# Patient Record
Sex: Female | Born: 1960
Health system: Southern US, Community
[De-identification: ages and names within clinical notes are randomized; demographics above are authoritative.]

## PROBLEM LIST (undated history)

## (undated) DIAGNOSIS — M549 Dorsalgia, unspecified: Secondary | ICD-10-CM

## (undated) DIAGNOSIS — R1310 Dysphagia, unspecified: Secondary | ICD-10-CM

## (undated) DIAGNOSIS — F4010 Social phobia, unspecified: Secondary | ICD-10-CM

## (undated) DIAGNOSIS — R12 Heartburn: Secondary | ICD-10-CM

## (undated) DIAGNOSIS — D219 Benign neoplasm of connective and other soft tissue, unspecified: Secondary | ICD-10-CM

## (undated) DIAGNOSIS — A0472 Enterocolitis due to Clostridium difficile, not specified as recurrent: Secondary | ICD-10-CM

## (undated) DIAGNOSIS — F320 Major depressive disorder, single episode, mild: Secondary | ICD-10-CM

## (undated) DIAGNOSIS — Z87898 Personal history of other specified conditions: Secondary | ICD-10-CM

## (undated) DIAGNOSIS — F419 Anxiety disorder, unspecified: Secondary | ICD-10-CM

## (undated) DIAGNOSIS — F32A Depression, unspecified: Secondary | ICD-10-CM

## (undated) DIAGNOSIS — M47816 Spondylosis without myelopathy or radiculopathy, lumbar region: Secondary | ICD-10-CM

## (undated) DIAGNOSIS — N870 Mild cervical dysplasia: Secondary | ICD-10-CM

## (undated) DIAGNOSIS — E7800 Pure hypercholesterolemia, unspecified: Secondary | ICD-10-CM

## (undated) DIAGNOSIS — E559 Vitamin D deficiency, unspecified: Secondary | ICD-10-CM

## (undated) DIAGNOSIS — M722 Plantar fascial fibromatosis: Secondary | ICD-10-CM

## (undated) DIAGNOSIS — K219 Gastro-esophageal reflux disease without esophagitis: Secondary | ICD-10-CM

## (undated) DIAGNOSIS — G4730 Sleep apnea, unspecified: Secondary | ICD-10-CM

## (undated) DIAGNOSIS — R0602 Shortness of breath: Secondary | ICD-10-CM

## (undated) DIAGNOSIS — M9420 Chondromalacia, unspecified site: Secondary | ICD-10-CM

## (undated) DIAGNOSIS — M7989 Other specified soft tissue disorders: Secondary | ICD-10-CM

## (undated) DIAGNOSIS — B009 Herpesviral infection, unspecified: Secondary | ICD-10-CM

## (undated) DIAGNOSIS — R002 Palpitations: Secondary | ICD-10-CM

## (undated) DIAGNOSIS — Z973 Presence of spectacles and contact lenses: Secondary | ICD-10-CM

## (undated) DIAGNOSIS — N8110 Cystocele, unspecified: Secondary | ICD-10-CM

## (undated) HISTORY — DX: Dysphagia, unspecified: R13.10

## (undated) HISTORY — DX: Cystocele, unspecified: N81.10

## (undated) HISTORY — DX: Shortness of breath: R06.02

## (undated) HISTORY — DX: Dorsalgia, unspecified: M54.9

## (undated) HISTORY — DX: Herpesviral infection, unspecified: B00.9

## (undated) HISTORY — DX: Depression, unspecified: F32.A

## (undated) HISTORY — DX: Heartburn: R12

## (undated) HISTORY — PX: TUBAL LIGATION: SHX77

## (undated) HISTORY — DX: Sleep apnea, unspecified: G47.30

## (undated) HISTORY — PX: APPENDECTOMY: SHX54

## (undated) HISTORY — DX: Vitamin D deficiency, unspecified: E55.9

## (undated) HISTORY — DX: Social phobia, unspecified: F40.10

## (undated) HISTORY — DX: Plantar fascial fibromatosis: M72.2

## (undated) HISTORY — DX: Anxiety disorder, unspecified: F41.9

## (undated) HISTORY — DX: Palpitations: R00.2

## (undated) HISTORY — DX: Mild cervical dysplasia: N87.0

## (undated) HISTORY — DX: Benign neoplasm of connective and other soft tissue, unspecified: D21.9

## (undated) HISTORY — DX: Enterocolitis due to Clostridium difficile, not specified as recurrent: A04.72

## (undated) HISTORY — DX: Gastro-esophageal reflux disease without esophagitis: K21.9

## (undated) HISTORY — DX: Major depressive disorder, single episode, mild: F32.0

## (undated) HISTORY — DX: Chondromalacia, unspecified site: M94.20

## (undated) HISTORY — DX: Spondylosis without myelopathy or radiculopathy, lumbar region: M47.816

## (undated) HISTORY — PX: DG BARIUM SWALLOW (ARMC HX): HXRAD1448

## (undated) HISTORY — PX: COLONOSCOPY: SHX174

## (undated) HISTORY — DX: Pure hypercholesterolemia, unspecified: E78.00

## (undated) HISTORY — DX: Other specified soft tissue disorders: M79.89

## (undated) HISTORY — PX: AUGMENTATION MAMMAPLASTY: SUR837

---
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
## 1989-10-08 DIAGNOSIS — N870 Mild cervical dysplasia: Secondary | ICD-10-CM

## 1989-10-08 HISTORY — DX: Mild cervical dysplasia: N87.0

## 1995-01-07 HISTORY — PX: TUBAL LIGATION: SHX77

## 1996-10-08 DIAGNOSIS — F4010 Social phobia, unspecified: Secondary | ICD-10-CM

## 1996-10-08 HISTORY — DX: Social phobia, unspecified: F40.10

## 1999-10-30 ENCOUNTER — Other Ambulatory Visit: Admission: RE | Admit: 1999-10-30 | Discharge: 1999-10-30 | Payer: Self-pay | Admitting: Obstetrics and Gynecology

## 2000-08-23 ENCOUNTER — Encounter: Admission: RE | Admit: 2000-08-23 | Discharge: 2000-08-23 | Payer: Self-pay | Admitting: Family Medicine

## 2000-08-23 ENCOUNTER — Encounter: Payer: Self-pay | Admitting: Family Medicine

## 2000-12-25 ENCOUNTER — Other Ambulatory Visit: Admission: RE | Admit: 2000-12-25 | Discharge: 2000-12-25 | Payer: Self-pay | Admitting: Obstetrics and Gynecology

## 2002-02-17 ENCOUNTER — Other Ambulatory Visit: Admission: RE | Admit: 2002-02-17 | Discharge: 2002-02-17 | Payer: Self-pay | Admitting: Obstetrics and Gynecology

## 2003-02-24 ENCOUNTER — Other Ambulatory Visit: Admission: RE | Admit: 2003-02-24 | Discharge: 2003-02-24 | Payer: Self-pay | Admitting: Obstetrics and Gynecology

## 2003-03-05 ENCOUNTER — Encounter: Payer: Self-pay | Admitting: Obstetrics and Gynecology

## 2003-03-05 ENCOUNTER — Encounter: Admission: RE | Admit: 2003-03-05 | Discharge: 2003-03-05 | Payer: Self-pay | Admitting: Obstetrics and Gynecology

## 2004-03-13 ENCOUNTER — Encounter: Admission: RE | Admit: 2004-03-13 | Discharge: 2004-03-13 | Payer: Self-pay | Admitting: Obstetrics and Gynecology

## 2004-06-06 ENCOUNTER — Other Ambulatory Visit: Admission: RE | Admit: 2004-06-06 | Discharge: 2004-06-06 | Payer: Self-pay | Admitting: Obstetrics and Gynecology

## 2004-12-04 ENCOUNTER — Emergency Department (HOSPITAL_COMMUNITY): Admission: EM | Admit: 2004-12-04 | Discharge: 2004-12-05 | Payer: Self-pay | Admitting: Emergency Medicine

## 2005-05-07 ENCOUNTER — Encounter: Admission: RE | Admit: 2005-05-07 | Discharge: 2005-05-07 | Payer: Self-pay | Admitting: Obstetrics and Gynecology

## 2005-10-10 ENCOUNTER — Other Ambulatory Visit: Admission: RE | Admit: 2005-10-10 | Discharge: 2005-10-10 | Payer: Self-pay | Admitting: Obstetrics and Gynecology

## 2006-05-14 ENCOUNTER — Ambulatory Visit (HOSPITAL_COMMUNITY): Admission: RE | Admit: 2006-05-14 | Discharge: 2006-05-14 | Payer: Self-pay | Admitting: Obstetrics & Gynecology

## 2006-06-26 ENCOUNTER — Encounter: Admission: RE | Admit: 2006-06-26 | Discharge: 2006-06-26 | Payer: Self-pay | Admitting: Obstetrics and Gynecology

## 2006-10-08 HISTORY — PX: BREAST ENHANCEMENT SURGERY: SHX7

## 2007-02-10 ENCOUNTER — Other Ambulatory Visit: Admission: RE | Admit: 2007-02-10 | Discharge: 2007-02-10 | Payer: Self-pay | Admitting: Gynecology

## 2008-02-13 ENCOUNTER — Other Ambulatory Visit: Admission: RE | Admit: 2008-02-13 | Discharge: 2008-02-13 | Payer: Self-pay | Admitting: Obstetrics and Gynecology

## 2008-10-08 DIAGNOSIS — B009 Herpesviral infection, unspecified: Secondary | ICD-10-CM

## 2008-10-08 HISTORY — DX: Herpesviral infection, unspecified: B00.9

## 2008-12-10 ENCOUNTER — Other Ambulatory Visit: Admission: RE | Admit: 2008-12-10 | Discharge: 2008-12-10 | Payer: Self-pay | Admitting: Obstetrics and Gynecology

## 2009-02-18 ENCOUNTER — Other Ambulatory Visit: Admission: RE | Admit: 2009-02-18 | Discharge: 2009-02-18 | Payer: Self-pay | Admitting: Obstetrics and Gynecology

## 2009-05-03 ENCOUNTER — Encounter: Admission: RE | Admit: 2009-05-03 | Discharge: 2009-05-03 | Payer: Self-pay | Admitting: Family Medicine

## 2010-05-08 ENCOUNTER — Encounter: Admission: RE | Admit: 2010-05-08 | Discharge: 2010-05-08 | Payer: Self-pay | Admitting: Obstetrics and Gynecology

## 2011-05-11 ENCOUNTER — Other Ambulatory Visit: Payer: Self-pay | Admitting: Obstetrics and Gynecology

## 2011-05-11 DIAGNOSIS — Z1231 Encounter for screening mammogram for malignant neoplasm of breast: Secondary | ICD-10-CM

## 2011-05-29 ENCOUNTER — Ambulatory Visit
Admission: RE | Admit: 2011-05-29 | Discharge: 2011-05-29 | Disposition: A | Discharge status: Home / Self Care | Payer: BC Managed Care – PPO | Source: Ambulatory Visit | Attending: Obstetrics and Gynecology | Admitting: Obstetrics and Gynecology

## 2011-05-29 DIAGNOSIS — Z1231 Encounter for screening mammogram for malignant neoplasm of breast: Secondary | ICD-10-CM

## 2011-07-23 ENCOUNTER — Other Ambulatory Visit: Payer: Self-pay | Admitting: Family Medicine

## 2011-07-23 ENCOUNTER — Ambulatory Visit
Admission: RE | Admit: 2011-07-23 | Discharge: 2011-07-23 | Disposition: A | Discharge status: Home / Self Care | Payer: BC Managed Care – PPO | Source: Ambulatory Visit | Attending: Family Medicine | Admitting: Family Medicine

## 2012-08-28 ENCOUNTER — Other Ambulatory Visit: Payer: Self-pay | Admitting: Family Medicine

## 2012-08-28 DIAGNOSIS — Z1231 Encounter for screening mammogram for malignant neoplasm of breast: Secondary | ICD-10-CM

## 2012-10-13 ENCOUNTER — Other Ambulatory Visit: Payer: Self-pay | Admitting: Family Medicine

## 2012-10-13 DIAGNOSIS — R1030 Lower abdominal pain, unspecified: Secondary | ICD-10-CM

## 2012-10-13 DIAGNOSIS — R102 Pelvic and perineal pain: Secondary | ICD-10-CM

## 2012-10-14 ENCOUNTER — Ambulatory Visit
Admission: RE | Admit: 2012-10-14 | Discharge: 2012-10-14 | Disposition: A | Discharge status: Home / Self Care | Payer: BC Managed Care – PPO | Source: Ambulatory Visit | Attending: Family Medicine | Admitting: Family Medicine

## 2012-10-14 ENCOUNTER — Other Ambulatory Visit: Payer: Self-pay | Admitting: Family Medicine

## 2012-10-14 DIAGNOSIS — R102 Pelvic and perineal pain: Secondary | ICD-10-CM

## 2012-10-14 DIAGNOSIS — D259 Leiomyoma of uterus, unspecified: Secondary | ICD-10-CM

## 2012-10-14 DIAGNOSIS — R1030 Lower abdominal pain, unspecified: Secondary | ICD-10-CM

## 2012-10-18 ENCOUNTER — Ambulatory Visit
Admission: RE | Admit: 2012-10-18 | Discharge: 2012-10-18 | Disposition: A | Discharge status: Home / Self Care | Payer: BC Managed Care – PPO | Source: Ambulatory Visit | Attending: Family Medicine | Admitting: Family Medicine

## 2012-10-18 DIAGNOSIS — D259 Leiomyoma of uterus, unspecified: Secondary | ICD-10-CM

## 2012-10-18 MED ORDER — GADOBENATE DIMEGLUMINE 529 MG/ML IV SOLN
14.0000 mL | Freq: Once | INTRAVENOUS | Status: AC | PRN
  Administered 2012-10-18: 14 mL via INTRAVENOUS

## 2013-03-03 ENCOUNTER — Encounter: Payer: Self-pay | Admitting: Obstetrics and Gynecology

## 2013-03-03 DIAGNOSIS — F4010 Social phobia, unspecified: Secondary | ICD-10-CM | POA: Insufficient documentation

## 2013-03-04 ENCOUNTER — Ambulatory Visit (INDEPENDENT_AMBULATORY_CARE_PROVIDER_SITE_OTHER): Payer: BC Managed Care – PPO | Admitting: Obstetrics and Gynecology

## 2013-03-04 ENCOUNTER — Encounter: Payer: Self-pay | Admitting: Obstetrics and Gynecology

## 2013-03-04 VITALS — BP 100/60 | HR 72 | Wt 157.0 lb

## 2013-03-04 DIAGNOSIS — D251 Intramural leiomyoma of uterus: Secondary | ICD-10-CM

## 2013-03-04 NOTE — Patient Instructions (Signed)
We will call you to schedule your ultrasound.  

## 2013-03-04 NOTE — Progress Notes (Signed)
52 yo widowed WF G2P2 here to re-evaluate uterine fibroids.  Pt reports continued pelivc pain, mostly on her right side, sometimes also in her back.  She feels it more when she bends and lifts;  The muscles on that right side feel very fatigued.  The pain comes on monthly, and lasts about a week before her period, and then resolves when her period starts.  Pt's sister had endometriosis, and pt wondering if she has it too.    MR pelvis ordered by Dr. Duaine Dredge in January reviewed.  Showed two fibroids, each about 2 1/2 cm.  No mention of any abnormality in the right hip, which is where pt is feeling her pain.    Exam:  WF, NAD     Abd:  Soft, NT, Nl BS, no masses.     No Adenopathy in groin     Ext, vag, cx nl.  Uterus nodular, especially posteriorly.  It feels nl size in terms of how tall it is, and I can feel a small fibroid in the posterior uterine segment, but there is a suggestion of a larger fundal poss fibroid in the post fundus where I cannot feel very well.  No adnexal masses or tenderness.  A:  Right LQ pelvic/hip/flank pain.  R/O enlarging fibroids vs other cause  P:  Repeat pus.  Then approp tx.  Pt to take aleve prn for now.

## 2013-03-26 ENCOUNTER — Telehealth: Payer: Self-pay | Admitting: Obstetrics and Gynecology

## 2013-04-08 ENCOUNTER — Other Ambulatory Visit: Payer: BC Managed Care – PPO | Admitting: Obstetrics and Gynecology

## 2013-04-08 ENCOUNTER — Other Ambulatory Visit: Payer: BC Managed Care – PPO

## 2013-04-21 NOTE — Telephone Encounter (Signed)
LMTCB on mobile number to move PUS to later in the day.  Called home number and spoke with patient. Rescheduled to 04/29/13

## 2013-04-22 ENCOUNTER — Other Ambulatory Visit: Payer: BC Managed Care – PPO | Admitting: Obstetrics and Gynecology

## 2013-04-22 ENCOUNTER — Other Ambulatory Visit: Payer: BC Managed Care – PPO

## 2013-04-29 ENCOUNTER — Ambulatory Visit (INDEPENDENT_AMBULATORY_CARE_PROVIDER_SITE_OTHER): Payer: BC Managed Care – PPO | Admitting: Obstetrics and Gynecology

## 2013-04-29 ENCOUNTER — Ambulatory Visit (INDEPENDENT_AMBULATORY_CARE_PROVIDER_SITE_OTHER): Payer: BC Managed Care – PPO

## 2013-04-29 VITALS — BP 102/60 | Ht 63.75 in | Wt 160.4 lb

## 2013-04-29 DIAGNOSIS — D251 Intramural leiomyoma of uterus: Secondary | ICD-10-CM

## 2013-04-29 DIAGNOSIS — M25551 Pain in right hip: Secondary | ICD-10-CM

## 2013-04-29 NOTE — Patient Instructions (Signed)
Keep on with your routine follow up gyn care.

## 2013-04-29 NOTE — Progress Notes (Signed)
52 yo [facility] G2P2 with intermittant RLQ pain since Oct 07, 2012.  Saw Dr. Duaine Dredge, and had a full work up with included a pelvic ultrasound and a pelvic MRI.  At that time, no abnormalities were noted other than two fibroids, which on PUS in Jan 2014 measured 3 cm and 3.5 cm.  Her pain was initially described as suprapubic radiating to the LLQ, but now she states it is usu in the RLQ.       Findings discussed with the patient.  She states that as long as there is nothing very serious noted, she will just watch the pain for now, and see how it goes over time. She feels better knowing that her fibroids are stable or only very slightly increased in size.  I do not think the small cyst on her ovary is responsible for this 6 month h/o abd pain that seems migratory.  Perhaps it is related to IBS, and pt knows she needs to drink more water and increase fiber in her diet.  She is encouraged to continue routine care with Dr. Duaine Dredge.  He did her anex in Nov, including her pelvic exam, so she is up to date on that.  Follow up here just prn.

## 2013-10-28 ENCOUNTER — Other Ambulatory Visit: Payer: Self-pay

## 2013-10-28 DIAGNOSIS — Z1231 Encounter for screening mammogram for malignant neoplasm of breast: Secondary | ICD-10-CM

## 2013-10-30 ENCOUNTER — Other Ambulatory Visit: Payer: Self-pay

## 2013-10-30 DIAGNOSIS — Z9882 Breast implant status: Secondary | ICD-10-CM

## 2013-10-30 DIAGNOSIS — Z1231 Encounter for screening mammogram for malignant neoplasm of breast: Secondary | ICD-10-CM

## 2013-11-13 ENCOUNTER — Ambulatory Visit
Admission: RE | Admit: 2013-11-13 | Discharge: 2013-11-13 | Disposition: A | Discharge status: Home / Self Care | Payer: BC Managed Care – PPO | Source: Ambulatory Visit

## 2013-11-13 ENCOUNTER — Other Ambulatory Visit: Payer: Self-pay

## 2013-11-13 DIAGNOSIS — Z9882 Breast implant status: Secondary | ICD-10-CM

## 2013-11-13 DIAGNOSIS — Z1231 Encounter for screening mammogram for malignant neoplasm of breast: Secondary | ICD-10-CM

## 2013-11-17 ENCOUNTER — Other Ambulatory Visit: Payer: Self-pay | Admitting: Family Medicine

## 2013-11-17 DIAGNOSIS — R928 Other abnormal and inconclusive findings on diagnostic imaging of breast: Secondary | ICD-10-CM

## 2013-11-26 ENCOUNTER — Ambulatory Visit
Admission: RE | Admit: 2013-11-26 | Discharge: 2013-11-26 | Disposition: A | Discharge status: Home / Self Care | Payer: BC Managed Care – PPO | Source: Ambulatory Visit | Attending: Family Medicine | Admitting: Family Medicine

## 2013-11-26 DIAGNOSIS — R928 Other abnormal and inconclusive findings on diagnostic imaging of breast: Secondary | ICD-10-CM

## 2014-01-27 ENCOUNTER — Telehealth: Payer: Self-pay | Admitting: Obstetrics and Gynecology

## 2014-01-27 NOTE — Telephone Encounter (Signed)
Spoke with patient. Reviewed patient's paper chart and no AEX seen for 2014. Patient states that she was in our office a lot in 2014 due to fibroids and did not feel like AEX was necessary. Patient states that last AEX was done by primary doctor in 2013. Patient requesting to move aex date to 4/29 and cancel 5/13 appointment. States "I have not had AEX since 2013 so I am not worried about coverage by moving the date." Advised cancelled 5/13 appointment and scheduled AEX for 4/29 at 11:30 with Dr.Silva. Patient will also discuss birth control options at this time. Patient agreeable and verbalizes understanding.  Routing to provider for final review. Patient agreeable to disposition. Will close encounter

## 2014-01-27 NOTE — Telephone Encounter (Signed)
Spoke with patient. Patient states that she would like to considering starting birth control to regulate cycle while she goes on a mission trip. Patient states that she does not know if this is her best option and has not been on any birth control since the 80's due to tubal ligation. Patient is going out of town in June and would like to discuss birth control before annual exam which is scheduled for 5/13. Advised would like patient to make office visit to come in to discuss birth control options and regulating cycle. Patient agreeable. Patient scheduled for 4/29 at 11:30 with Dr.Silva. Patient would like to know if she can move AEX up to this day as well. Advised would have to pull paper chart to see date of last AEX and would give patient a call back to advise about need to keep AEX on 5/13 or if it can be moved to sooner date. Patient agreeable.

## 2014-01-27 NOTE — Telephone Encounter (Signed)
Patient thinks she may want to start taking birth control pills to stop her cycle while she is on a mission trip.

## 2014-02-01 ENCOUNTER — Other Ambulatory Visit: Payer: Self-pay | Admitting: Family Medicine

## 2014-02-01 ENCOUNTER — Ambulatory Visit
Admission: RE | Admit: 2014-02-01 | Discharge: 2014-02-01 | Disposition: A | Discharge status: Home / Self Care | Payer: BC Managed Care – PPO | Source: Ambulatory Visit | Attending: Family Medicine | Admitting: Family Medicine

## 2014-02-01 DIAGNOSIS — M5416 Radiculopathy, lumbar region: Secondary | ICD-10-CM

## 2014-02-01 DIAGNOSIS — M545 Low back pain, unspecified: Secondary | ICD-10-CM

## 2014-02-03 ENCOUNTER — Ambulatory Visit (INDEPENDENT_AMBULATORY_CARE_PROVIDER_SITE_OTHER): Payer: BC Managed Care – PPO | Admitting: Obstetrics and Gynecology

## 2014-02-03 ENCOUNTER — Ambulatory Visit
Admission: RE | Admit: 2014-02-03 | Discharge: 2014-02-03 | Disposition: A | Discharge status: Home / Self Care | Payer: BC Managed Care – PPO | Source: Ambulatory Visit | Attending: Family Medicine | Admitting: Family Medicine

## 2014-02-03 ENCOUNTER — Encounter: Payer: Self-pay | Admitting: Obstetrics and Gynecology

## 2014-02-03 VITALS — BP 100/68 | HR 70 | Ht 63.5 in | Wt 160.2 lb

## 2014-02-03 DIAGNOSIS — R1031 Right lower quadrant pain: Secondary | ICD-10-CM

## 2014-02-03 DIAGNOSIS — M5416 Radiculopathy, lumbar region: Secondary | ICD-10-CM

## 2014-02-03 DIAGNOSIS — D259 Leiomyoma of uterus, unspecified: Secondary | ICD-10-CM

## 2014-02-03 DIAGNOSIS — D219 Benign neoplasm of connective and other soft tissue, unspecified: Secondary | ICD-10-CM

## 2014-02-03 DIAGNOSIS — Z0000 Encounter for general adult medical examination without abnormal findings: Secondary | ICD-10-CM

## 2014-02-03 DIAGNOSIS — Z01419 Encounter for gynecological examination (general) (routine) without abnormal findings: Secondary | ICD-10-CM

## 2014-02-03 LAB — POCT URINALYSIS DIPSTICK
BILIRUBIN UA: NEGATIVE
Glucose, UA: NEGATIVE
KETONES UA: NEGATIVE
LEUKOCYTES UA: NEGATIVE
NITRITE UA: NEGATIVE
PH UA: 5
PROTEIN UA: NEGATIVE
RBC UA: NEGATIVE
Urobilinogen, UA: NEGATIVE

## 2014-02-03 NOTE — Progress Notes (Signed)
Patient ID: Lisa Wade, female   DOB: 1961/05/28, 53 y.o.   MRN: [id number] GYNECOLOGY VISIT  PCP:   Derinda Late, MD  Referring provider:   HPI: 53 y.o.   Widowed  Caucasian  female   G2P2002 with Patient's last menstrual period was 01/28/2014.   here for  AEX.  RLQ pain acute onset 6 days ago.   Had an MRI in February 2014 and noted to have two fibroids - largest is 2.7 cm and located on the left posterior fundus, smaller is 2.6 cm and is located on the right posterior fundus.  Follow up ultrasound showed slightly larger fibroids in July 2014.  Taking hydrocodone to treat pain.  Pain seems to be worse with movement.  No change in menses.  No heavy bleeding or irregular bleeding.   Having right lower quadrant pain for two years and is having work up with Dr. Sandi Mariscal.  Spine MRI done today showing disc disease.  Going to [place] for a mission trip in June 20th.   Does not need refill of Valtrex.   Some constipation.  No history of kidney stones.  Hgb:   PCP Urine:  Neg  GYNECOLOGIC HISTORY: Patient's last menstrual period was 01/28/2014. Sexually active:  yes Partner preference: female Contraception:   Tubal ligation Menopausal hormone therapy: n/a DES exposure:   no Blood transfusions:   no Sexually transmitted diseases:   HSV GYN procedures and prior surgeries: Tubal ligation  Last mammogram:  11-16-13 screening revealed abnormality of right breast and recommended diagnostic mammogram.  Patient had diagnostic mammogram of right breast on 11-26-13 which revealed calicifications in right breast.  Radiologist recommended f/u in six months.:[facility].               Last pap and high risk HPV testing:   02-06-10 wnl History of abnormal pap smear:  1991 had cryotherapy to cervix for CIN I to cervix.  Paps normal since.   OB History   Grav Para Term Preterm Abortions TAB SAB Ect Mult Living   2 2 2       2        LIFESTYLE: Exercise:  no             Tobacco:   no Alcohol:    1-2 glasses of wine per week Drug use:  no  OTHER HEALTH MAINTENANCE: Tetanus/TDap:   Up to date through PCP Gardisil:              n/a Influenza:            07/2013 Zostavax:             n/a  Bone density:      n/a Colonoscopy:      2015 wnl with Dr. Earlean Shawl.  Next colonoscopy due 2025.  Cholesterol check:  wnl with PCP  Family History  Problem Relation Age of Onset  . Cancer Father     lung  . Cancer Brother     testicular cancer  . Osteoarthritis Mother   . Migraines Mother   . Thyroid disease Mother   . Osteoarthritis Maternal Grandmother   . Hypertension Paternal Grandmother   . Stroke Paternal Grandmother     Patient Active Problem List   Diagnosis Date Noted  . Social anxiety disorder    Past Medical History  Diagnosis Date  . Dysplasia of cervix, low grade (CIN 1) 1991  . C. difficile colitis   . Chondromalacia   . Social  anxiety disorder 1998  . HSV-1 infection 2010    Past Surgical History  Procedure Laterality Date  . Tubal ligation    . Breast enhancement surgery  2008    Saline Harlow Mares)  . Appendectomy      ALLERGIES: Cephalosporins  Current Outpatient Prescriptions  Medication Sig Dispense Refill  . ALPRAZolam (XANAX) 0.5 MG tablet Take 0.5 mg by mouth at bedtime as needed for sleep.      . Amphetamine-Dextroamphetamine (ADDERALL PO) Take by mouth.      . escitalopram (LEXAPRO) 20 MG tablet Take 20 mg by mouth daily.      Marland Kitchen HYDROcodone-acetaminophen (NORCO/VICODIN) 5-325 MG per tablet Take 1 tablet by mouth every 6 (six) hours as needed for moderate pain.      . valACYclovir (VALTREX) 500 MG tablet Take 500 mg by mouth 2 (two) times daily.       No current facility-administered medications for this visit.     ROS:  Pertinent items are noted in HPI.  SOCIAL HISTORY:  Hairdresser.   PHYSICAL EXAMINATION:    BP 100/68  Pulse 70  Ht 5' 3.5" (1.613 m)  Wt 160 lb 3.2 oz (72.666 kg)  BMI 27.93 kg/m2  LMP 01/28/2014   Wt  Readings from Last 3 Encounters:  02/03/14 160 lb 3.2 oz (72.666 kg)  04/29/13 160 lb 6.4 oz (72.757 kg)  03/04/13 157 lb (71.215 kg)     Ht Readings from Last 3 Encounters:  02/03/14 5' 3.5" (1.613 m)  04/29/13 5' 3.75" (1.619 m)    General appearance: alert, cooperative and appears stated age Head: Normocephalic, without obvious abnormality, atraumatic Neck: no adenopathy, supple, symmetrical, trachea midline and thyroid not enlarged, symmetric, no tenderness/mass/nodules Lungs: clear to auscultation bilaterally Breasts: scars around nipples, bilateral implants, No nipple retraction or dimpling, No nipple discharge or bleeding, No axillary or supraclavicular adenopathy, Normal to palpation without dominant masses Heart: regular rate and rhythm Abdomen: soft, non-tender; no masses,  no organomegaly Extremities: extremities normal, atraumatic, no cyanosis or edema Skin: Skin color, texture, turgor normal. No rashes or lesions Lymph nodes: Cervical, supraclavicular, and axillary nodes normal. No abnormal inguinal nodes palpated Neurologic: Grossly normal  Pelvic: External genitalia:  no lesions              Urethra:  normal appearing urethra with no masses, tenderness or lesions              Bartholins and Skenes: normal                 Vagina: normal appearing vagina with normal color and discharge, no lesions              Cervix: normal appearance              Pap and high risk HPV testing done: yes.            Bimanual Exam:  Uterus:  uterus is 8 week size, consistency and nontender                                      Adnexa: normal adnexa in size, nontender and no masses                                      Rectovaginal: Confirms  Anus:  normal sphincter tone, no lesions  ASSESSMENT  Normal gynecologic exam. RLQ pain.  Lumbar disc disease.  Uterine fibroids. Right breast calcifications.  Bilateral breast augmentation.    PLAN  Mammogram recommended yearly.  Due to a 6 month recheck in August at [facility].  Pap smear and high risk HPV testing Counseled on self breast exam. Return for pelvic ultrasound tomorrow.  Patient considering OCPs for amenorrhea.  Return annually or prn   An After Visit Summary was printed and given to the patient.

## 2014-02-03 NOTE — Patient Instructions (Signed)

## 2014-02-04 ENCOUNTER — Encounter: Payer: Self-pay | Admitting: Obstetrics and Gynecology

## 2014-02-04 ENCOUNTER — Ambulatory Visit (INDEPENDENT_AMBULATORY_CARE_PROVIDER_SITE_OTHER): Payer: BC Managed Care – PPO

## 2014-02-04 ENCOUNTER — Ambulatory Visit (INDEPENDENT_AMBULATORY_CARE_PROVIDER_SITE_OTHER): Payer: BC Managed Care – PPO | Admitting: Obstetrics and Gynecology

## 2014-02-04 VITALS — BP 100/60 | HR 66 | Ht 63.5 in | Wt 161.0 lb

## 2014-02-04 DIAGNOSIS — D219 Benign neoplasm of connective and other soft tissue, unspecified: Secondary | ICD-10-CM

## 2014-02-04 DIAGNOSIS — R1031 Right lower quadrant pain: Secondary | ICD-10-CM

## 2014-02-04 DIAGNOSIS — D259 Leiomyoma of uterus, unspecified: Secondary | ICD-10-CM

## 2014-02-04 NOTE — Progress Notes (Signed)
Patient ID: Lisa Wade, female   DOB: 1960-10-21, 53 y.o.   MRN: [id number] GYNECOLOGY  VISIT   HPI: 53 y.o.   Widowed  Caucasian  female   G2P2002 with Patient's last menstrual period was 01/28/2014.   here for  Pelvic ultrasound. Patient with RLQ pain.  Has disc disease on recent MRI. Known uterine fibroids.  Traveling to [place] for a mission.  Considered OCPs for amenorrhea. Had mood swings while on them in the past. Eventually went on Lexapro. Sometimes has heart fluttering and palpitations.   GYNECOLOGIC HISTORY: Patient's last menstrual period was 01/28/2014. Contraception: Tubal ligation   Menopausal hormone therapy: n/a        OB History   Grav Para Term Preterm Abortions TAB SAB Ect Mult Living   2 2 2       2          Patient Active Problem List   Diagnosis Date Noted  . Social anxiety disorder     Past Medical History  Diagnosis Date  . Dysplasia of cervix, low grade (CIN 1) 1991  . C. difficile colitis   . Chondromalacia   . Social anxiety disorder 1998  . HSV-1 infection 2010    Past Surgical History  Procedure Laterality Date  . Tubal ligation    . Breast enhancement surgery  2008    Saline Harlow Mares)  . Appendectomy      Current Outpatient Prescriptions  Medication Sig Dispense Refill  . ALPRAZolam (XANAX) 0.5 MG tablet Take 0.5 mg by mouth at bedtime as needed for sleep.      . Amphetamine-Dextroamphetamine (ADDERALL PO) Take by mouth.      . escitalopram (LEXAPRO) 20 MG tablet Take 20 mg by mouth daily.      Marland Kitchen HYDROcodone-acetaminophen (NORCO/VICODIN) 5-325 MG per tablet Take 1 tablet by mouth every 6 (six) hours as needed for moderate pain.      . valACYclovir (VALTREX) 500 MG tablet Take 500 mg by mouth 2 (two) times daily.       No current facility-administered medications for this visit.     ALLERGIES: Cephalosporins  Family History  Problem Relation Age of Onset  . Cancer Father     lung  . Cancer Brother     testicular cancer   . Osteoarthritis Mother   . Migraines Mother   . Thyroid disease Mother   . Osteoarthritis Maternal Grandmother   . Hypertension Paternal Grandmother   . Stroke Paternal Grandmother     History   Social History  . Marital Status: Widowed    Spouse Name: N/A    Number of Children: N/A  . Years of Education: N/A   Occupational History  . Not on file.   Social History Main Topics  . Smoking status: Never Smoker   . Smokeless tobacco: Never Used  . Alcohol Use: 1.0 oz/week    2 drink(s) per week     Comment: 1-2 glasses of wine per week  . Drug Use: No  . Sexual Activity: Yes    Partners: Male    Birth Control/ Protection: Surgical     Comment: BTL   Other Topics Concern  . Not on file   Social History Narrative  . No narrative on file    ROS:  Pertinent items are noted in HPI.  PHYSICAL EXAMINATION:    BP 100/60  Pulse 66  Ht 5' 3.5" (1.613 m)  Wt 161 lb (73.029 kg)  BMI 28.07 kg/m2  LMP  01/28/2014     General appearance: alert, cooperative and appears stated age   ASSESSMENT  Uterine fibroids - enlarging slightly in size.  Disc disease of lumbar spine.  Intolerance of OCPs in past.   PLAN  Observation of fibroids.  No birth control for amenorrhea at this time.  I reviewed potential side effects of thromboembolic events.  I would do yearly ultrasound while premenopausal.  Return for annual exam and prn.    An After Visit Summary was printed and given to the patient.  _15_____ minutes face to face time of which over 50% was spent in counseling.

## 2014-02-04 NOTE — Addendum Note (Signed)
Addended by: Tacy Learn, Alexes Lamarque E on: 02/04/2014 02:17 PM   Modules accepted: Orders

## 2014-02-04 NOTE — Progress Notes (Signed)
  Ultrasound of pelvis - Report and images reviewed by me and patient.   Uterus with 2 posterior fundal fibroids - 4.1 and 3.4 cm.  Endometrium 6.98.  Ovaries with follicles. No free fluid.

## 2014-02-04 NOTE — Patient Instructions (Signed)
Fibroids Fibroids are lumps (tumors) that can occur any place in a woman's body. These lumps are not cancerous. Fibroids vary in size, weight, and where they grow. HOME CARE  Do not take aspirin.  Write down the number of pads or tampons you use during your period. Tell your doctor. This can help determine the best treatment for you. GET HELP RIGHT AWAY IF:  You have pain in your lower belly (abdomen) that is not helped with medicine.  You have cramps that are not helped with medicine.  You have more bleeding between or during your period.  You feel lightheaded or pass out (faint).  Your lower belly pain gets worse. MAKE SURE YOU:  Understand these instructions.  Will watch your condition.  Will get help right away if you are not doing well or get worse. Document Released: 10/27/2010 Document Revised: 12/17/2011 Document Reviewed: 10/27/2010 Exit[employer] Patient Information 2014 [employer].  

## 2014-02-05 ENCOUNTER — Ambulatory Visit (INDEPENDENT_AMBULATORY_CARE_PROVIDER_SITE_OTHER): Payer: BC Managed Care – PPO | Admitting: Internal Medicine

## 2014-02-05 DIAGNOSIS — Z23 Encounter for immunization: Secondary | ICD-10-CM

## 2014-02-05 DIAGNOSIS — Z789 Other specified health status: Secondary | ICD-10-CM

## 2014-02-05 MED ORDER — AZITHROMYCIN 500 MG PO TABS: 500.0000 mg | ORAL_TABLET | Freq: Every day | ORAL | Status: DC

## 2014-02-05 MED ORDER — TYPHOID VACCINE PO CPDR: 1.0000 | DELAYED_RELEASE_CAPSULE | ORAL | Status: DC

## 2014-02-05 MED ORDER — CHLOROQUINE PHOSPHATE 500 MG PO TABS: 500.0000 mg | ORAL_TABLET | Freq: Every day | ORAL | Status: DC

## 2014-02-05 NOTE — Progress Notes (Signed)
   Subjective:    Patient ID: Lisa Wade, female    DOB: [birth year], 53 y.o.   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 [place]. Previously traveled to [place]  Ims: had flu vaccine this past year Meds: escitalopram 10mg  daily All: nkma    Review of Systems     Objective:   Physical Exam        Assessment & Plan:  Travel vaccines = recommended hep a and gave rx for oral typhoid  Traveler's diarrhea= gave rx for azithromycin if needed  Malaria prophylaxis = gave rx for chloroquine

## 2014-02-08 LAB — IPS PAP TEST WITH HPV

## 2014-02-16 ENCOUNTER — Other Ambulatory Visit: Payer: Self-pay | Admitting: Internal Medicine

## 2014-02-16 DIAGNOSIS — Z789 Other specified health status: Secondary | ICD-10-CM

## 2014-02-16 MED ORDER — ATOVAQUONE-PROGUANIL HCL 250-100 MG PO TABS: 1.0000 | ORAL_TABLET | Freq: Every day | ORAL | Status: DC

## 2014-02-17 ENCOUNTER — Ambulatory Visit: Payer: BC Managed Care – PPO | Admitting: Obstetrics and Gynecology

## 2014-05-03 ENCOUNTER — Other Ambulatory Visit: Payer: Self-pay | Admitting: Obstetrics and Gynecology

## 2014-05-03 DIAGNOSIS — R921 Mammographic calcification found on diagnostic imaging of breast: Secondary | ICD-10-CM

## 2014-05-10 ENCOUNTER — Ambulatory Visit
Admission: RE | Admit: 2014-05-10 | Discharge: 2014-05-10 | Disposition: A | Discharge status: Home / Self Care | Payer: BC Managed Care – PPO | Source: Ambulatory Visit | Attending: Obstetrics and Gynecology | Admitting: Obstetrics and Gynecology

## 2014-05-10 DIAGNOSIS — R921 Mammographic calcification found on diagnostic imaging of breast: Secondary | ICD-10-CM

## 2014-08-09 ENCOUNTER — Encounter: Payer: Self-pay | Admitting: Obstetrics and Gynecology

## 2015-02-07 ENCOUNTER — Encounter: Payer: Self-pay | Admitting: Obstetrics and Gynecology

## 2015-02-07 ENCOUNTER — Ambulatory Visit: Payer: BC Managed Care – PPO | Admitting: Obstetrics and Gynecology

## 2015-02-07 ENCOUNTER — Ambulatory Visit (INDEPENDENT_AMBULATORY_CARE_PROVIDER_SITE_OTHER): Payer: BLUE CROSS/BLUE SHIELD | Admitting: Obstetrics and Gynecology

## 2015-02-07 VITALS — BP 100/70 | HR 76 | Resp 16 | Ht 63.5 in | Wt 162.0 lb

## 2015-02-07 DIAGNOSIS — Z01419 Encounter for gynecological examination (general) (routine) without abnormal findings: Secondary | ICD-10-CM

## 2015-02-07 DIAGNOSIS — R312 Other microscopic hematuria: Secondary | ICD-10-CM | POA: Diagnosis not present

## 2015-02-07 DIAGNOSIS — Z0000 Encounter for general adult medical examination without abnormal findings: Secondary | ICD-10-CM | POA: Diagnosis not present

## 2015-02-07 DIAGNOSIS — R3129 Other microscopic hematuria: Secondary | ICD-10-CM

## 2015-02-07 LAB — POCT URINALYSIS DIPSTICK
Bilirubin, UA: NEGATIVE
Glucose, UA: NEGATIVE
KETONES UA: NEGATIVE
Leukocytes, UA: NEGATIVE
Nitrite, UA: NEGATIVE
PROTEIN UA: NEGATIVE
Urobilinogen, UA: NEGATIVE
pH, UA: 6

## 2015-02-07 NOTE — Patient Instructions (Signed)

## 2015-02-07 NOTE — Progress Notes (Signed)
54 y.o. G52P2002 Widowed Caucasian female here for annual exam.    Menses are regular but light.  March menses was heavy.  No real cramping.  Has fibroids.  No hot flashes.   Had prior MRI showing 2 fibroids - 2.5 and 2.7 cm.   Still having problems with side and her back. Pressure recently on her tailbone. Takes hydrocodone. Dr. Sandi Mariscal is managing her pain. Has disc disease.   Told she had low vit D, about 8 per patient.   PCP: Derinda Late   Patient's last menstrual period was 01/26/2015.          Sexually active: No.  The current method of family planning is tubal ligation.    Exercising: No.  The patient does not participate in regular exercise at present. Smoker:  no  Health Maintenance: Pap:  02/04/14 Neg. HR HPV:neg History of abnormal Pap:  Yes, CIN I 1991 MMG:  05/10/14 BIRADS2:Benign.  Due for screening mammogram in February 2016.  Self Breast Exam: rarely.  Colonoscopy:  2015 Normal. Dr. Earlean Shawl BMD:   Never  Result   TDaP:  PCP Screening Labs:  Hb today: PCP, Urine today: RBC=SMALL. - Voids often.  Has blood noted also at her PCP.    reports that she has never smoked. She has never used smokeless tobacco. She reports that she drinks about 1.0 oz of alcohol per week. She reports that she does not use illicit drugs.  Past Medical History  Diagnosis Date  . Dysplasia of cervix, low grade (CIN 1) 1991  . C. difficile colitis   . Chondromalacia   . Social anxiety disorder 1998  . HSV-1 infection 2010    Past Surgical History  Procedure Laterality Date  . Tubal ligation    . Breast enhancement surgery  2008    Saline Harlow Mares)  . Appendectomy      Current Outpatient Prescriptions  Medication Sig Dispense Refill  . ALPRAZolam (XANAX) 0.5 MG tablet Take 0.5 mg by mouth at bedtime as needed for sleep.    . Amphetamine-Dextroamphetamine (ADDERALL PO) Take by mouth.    . escitalopram (LEXAPRO) 20 MG tablet Take 20 mg by mouth daily.    . fluconazole  (DIFLUCAN) 150 MG tablet Take 1 tablet by mouth as needed.  0  . HYDROcodone-acetaminophen (NORCO/VICODIN) 5-325 MG per tablet Take 1 tablet by mouth every 6 (six) hours as needed for moderate pain.    Marland Kitchen imipramine (TOFRANIL) 10 MG tablet Take 1 tablet by mouth at bedtime as needed.  0  . valACYclovir (VALTREX) 500 MG tablet Take 500 mg by mouth 2 (two) times daily.     No current facility-administered medications for this visit.    Family History  Problem Relation Age of Onset  . Cancer Father     lung  . Cancer Brother     testicular cancer  . Osteoarthritis Mother   . Migraines Mother   . Thyroid disease Mother   . Osteoarthritis Maternal Grandmother   . Hypertension Paternal Grandmother   . Stroke Paternal Grandmother     ROS:  Pertinent items are noted in HPI.  Otherwise, a comprehensive ROS was negative.  Exam:   BP 100/70 mmHg  Pulse 76  Resp 16  Ht 5' 3.5" (1.613 m)  Wt 162 lb (73.483 kg)  BMI 28.24 kg/m2  LMP 01/26/2015    General appearance: alert, cooperative and appears stated age Head: Normocephalic, without obvious abnormality, atraumatic Neck: no adenopathy, supple, symmetrical, trachea midline and  thyroid normal to inspection and palpation Lungs: clear to auscultation bilaterally Breasts: normal appearance, no masses or tenderness, No nipple retraction or dimpling, No nipple discharge or bleeding, No axillary or supraclavicular adenopathy,  Consistent with bilateral implants.  Heart: regular rate and rhythm Abdomen: soft, non-tender; bowel sounds normal; no masses,  no organomegaly Extremities: extremities normal, atraumatic, no cyanosis or edema Skin: Skin color, texture, turgor normal. No rashes or lesions Lymph nodes: Cervical, supraclavicular, and axillary nodes normal. No abnormal inguinal nodes palpated Neurologic: Grossly normal  Pelvic: External genitalia:  no lesions              Urethra:  normal appearing urethra with no masses, tenderness or  lesions              Bartholins and Skenes: normal                 Vagina: normal appearing vagina with normal color and discharge, no lesions              Cervix: no lesions              Pap taken: No. Bimanual Exam:  Uterus:  normal size, contour, position, consistency, mobility, non-tender              Adnexa: normal adnexa and no mass, fullness, tenderness              Rectovaginal: Yes.  .  Confirms.              Anus:  normal sphincter tone, no lesions  Chaperone was present for exam.  Assessment:   Well woman visit with normal exam. Remote hx CIN I.  Microscopic hematuria.  Uterine fibroids. I do not feel like these are enlarging.  Chronic back pain.   Plan: Yearly mammogram recommended after age 78.  Patient will schedule. Recommended self breast exam.  Pap and HR HPV as above.  Labs performed.  Yes.  .   Urine micro and culture. See orders. Refills given on medications.  No..  See orders. Patient declines a repeat ultrasound of uterus for fibroid check.   I suggested re-evaluation of her back pain through her PCP and considering referral to Orthopedic surgeon.  Follow up annually and prn.     After visit summary provided.

## 2015-02-09 LAB — URINALYSIS, MICROSCOPIC ONLY
Casts: NONE SEEN
Crystals: NONE SEEN
Squamous Epithelial / LPF: NONE SEEN

## 2015-02-10 ENCOUNTER — Telehealth: Payer: Self-pay

## 2015-02-10 DIAGNOSIS — M5440 Lumbago with sciatica, unspecified side: Secondary | ICD-10-CM

## 2015-02-10 DIAGNOSIS — Z87448 Personal history of other diseases of urinary system: Secondary | ICD-10-CM

## 2015-02-10 LAB — URINE CULTURE
COLONY COUNT: NO GROWTH
ORGANISM ID, BACTERIA: NO GROWTH

## 2015-02-10 NOTE — Telephone Encounter (Signed)
-----   Message from Nunzio Cobbs, MD sent at 02/10/2015  6:04 AM EDT ----- Results to patient through My Chart. Please contact her in follow up.  I recommended proceeding with a CT scan to rule out kidney stones.  Patient is having persistent intermittent back pain and microscopic hematuria.   Cc- Marisa Sprinkles

## 2015-02-10 NOTE — Telephone Encounter (Signed)
Spoke with patient. Advised of message as seen below from [place]. Patient is agreeable to have CT scan at this time. States that Monday 5/9 and Friday 5/13 are the best days for her to have this done. Advised will notify Dr.Silva to have order placed and call to get her scheduled. Patient is agreeable.  Dr.Silva CT order pending your review and signature.

## 2015-02-10 NOTE — Telephone Encounter (Signed)
I approve the CT scan of the abdomen and pelvis with and without contrast to rule out stones, renal and bladder disease. I do not see how to sign the pended order.

## 2015-02-11 ENCOUNTER — Other Ambulatory Visit: Payer: Self-pay

## 2015-02-11 DIAGNOSIS — Z1231 Encounter for screening mammogram for malignant neoplasm of breast: Secondary | ICD-10-CM

## 2015-02-11 NOTE — Telephone Encounter (Signed)
Order signed for CT of abdomen and pelvis with and without contrast. Spoke with [facility]. CT scheduled for 5/13 at 2:30pm at [address] Spoke with patient. Advised of appointment date and time. Patient is agreeable. Advised no liquids 4 hours prior to scan. Will need to stop by [facility] before Friday to pick up contrast at which time she will be provided with step by step instructions. Patient is agreeable. Patient placed in imaging hold.  Routing to provider for final review. Patient agreeable to disposition. Patient aware provider will review message and nurse will return call with any additional instructions or change of disposition. Will close encounter.

## 2015-02-14 ENCOUNTER — Telehealth: Payer: Self-pay | Admitting: Obstetrics and Gynecology

## 2015-02-14 NOTE — Telephone Encounter (Signed)
Spoke with patient. Patient states that she spoke with [employer] and [place] and they are in network. "I am really sorry. I was worried about cost but I spoke with them and everything is sorted out." Advised patient to return call if she needs anything further. Patient is agreeable.  Routing to provider for final review. Patient agreeable to disposition. Patient aware provider will review message and nurse will return call with any additional instructions or change of disposition. Will close encounter.

## 2015-02-14 NOTE — Telephone Encounter (Signed)
Patient would like to speak with nurse about the ct scan that's scheduled for her. Would like to get in with an in network provider.

## 2015-02-17 ENCOUNTER — Telehealth: Payer: Self-pay

## 2015-02-17 NOTE — Telephone Encounter (Signed)
Spoke with Wells Guiles at [place]. Patient is scheduled for tomorrow CT abd/pelvis w/wo contrast. Wells Guiles states to evaluate for kidney stones need to perform without contrast as the contrast will obscure the view of any stones. Advised will let Dr.Silva know. Wells Guiles will call to let patient know that she will not have to pick up any contrast prior to appointment.  Routing to provider for final review. Patient agreeable to disposition. Patient aware provider will review message and nurse will return call with any additional instructions or change of disposition. Will close encounter.

## 2015-02-18 ENCOUNTER — Ambulatory Visit
Admission: RE | Admit: 2015-02-18 | Discharge: 2015-02-18 | Disposition: A | Discharge status: Home / Self Care | Payer: BLUE CROSS/BLUE SHIELD | Source: Ambulatory Visit | Attending: Obstetrics and Gynecology | Admitting: Obstetrics and Gynecology

## 2015-02-18 DIAGNOSIS — Z87448 Personal history of other diseases of urinary system: Secondary | ICD-10-CM

## 2015-02-18 DIAGNOSIS — M5440 Lumbago with sciatica, unspecified side: Secondary | ICD-10-CM

## 2015-02-21 ENCOUNTER — Telehealth: Payer: Self-pay

## 2015-02-21 DIAGNOSIS — D259 Leiomyoma of uterus, unspecified: Secondary | ICD-10-CM

## 2015-02-21 DIAGNOSIS — N83201 Unspecified ovarian cyst, right side: Secondary | ICD-10-CM

## 2015-02-21 NOTE — Telephone Encounter (Signed)
-----   Message from Nunzio Cobbs, MD sent at 02/18/2015  4:54 PM EDT ----- Results to patient through My Chart.  Please contact patient to set up a pelvic ultrasound appointment in the office to do follow up of the fibroids and her right ovarian cyst.   I still think that seeing an orthopedic specialist about her persistent back pain is a good idea.  Please have her arrange this through her PCP.

## 2015-02-21 NOTE — Telephone Encounter (Signed)
Entered by Nunzio Cobbs, MD at 02/18/2015 4:54 PM     Read by Milus Glazier at 02/20/2015 8:15 AM    Hello Lisa Wade,   The office will call you about your CT scan result which showed your kidneys are not enlarged and that you do not have any kidney stones.   The fibroids may be enlarging, and you have a right ovarian cyst.   I would like to have a pelvic ultrasound done in the office to evaluate further because CT scan is not a great method for evaluation of cysts. This could just represent an ovulation cyst.   My office will call you to schedule this.   Thank you,   Josefa Half, MD    Left message to call [place] at [phone].

## 2015-02-23 NOTE — Telephone Encounter (Addendum)
Spoke with patient. Advised of message and results as seen below from [place]. Patient is agreeable. Patient is unavailable to be seen before June 23rd for PUS. Appointment scheduled for June 23rd at [facility] with 10:30am consult with Dr.Silva. Patient is agreeable to date and time. Aware will need to be seen sooner if symptoms worsen or new symptoms develop.Ultrasound order placed for precert.   Routing to provider for final review. Patient agreeable to disposition. Will close encounter.

## 2015-03-18 ENCOUNTER — Other Ambulatory Visit: Payer: Self-pay

## 2015-03-18 ENCOUNTER — Ambulatory Visit
Admission: RE | Admit: 2015-03-18 | Discharge: 2015-03-18 | Disposition: A | Discharge status: Home / Self Care | Payer: BLUE CROSS/BLUE SHIELD | Source: Ambulatory Visit

## 2015-03-18 DIAGNOSIS — Z1231 Encounter for screening mammogram for malignant neoplasm of breast: Secondary | ICD-10-CM

## 2015-03-25 ENCOUNTER — Other Ambulatory Visit: Payer: Self-pay | Admitting: Family Medicine

## 2015-03-25 DIAGNOSIS — M5416 Radiculopathy, lumbar region: Secondary | ICD-10-CM

## 2015-03-31 ENCOUNTER — Ambulatory Visit (INDEPENDENT_AMBULATORY_CARE_PROVIDER_SITE_OTHER): Payer: BLUE CROSS/BLUE SHIELD | Admitting: Obstetrics and Gynecology

## 2015-03-31 ENCOUNTER — Ambulatory Visit (INDEPENDENT_AMBULATORY_CARE_PROVIDER_SITE_OTHER): Payer: BLUE CROSS/BLUE SHIELD

## 2015-03-31 ENCOUNTER — Encounter: Payer: Self-pay | Admitting: Obstetrics and Gynecology

## 2015-03-31 VITALS — BP 100/62 | HR 76 | Ht 63.5 in | Wt 162.0 lb

## 2015-03-31 DIAGNOSIS — D259 Leiomyoma of uterus, unspecified: Secondary | ICD-10-CM | POA: Diagnosis not present

## 2015-03-31 DIAGNOSIS — N83201 Unspecified ovarian cyst, right side: Secondary | ICD-10-CM

## 2015-03-31 DIAGNOSIS — N8320 Unspecified ovarian cysts: Secondary | ICD-10-CM

## 2015-03-31 NOTE — Progress Notes (Signed)
Subjective  54 y.o. G67P2002  caucasian female here for pelvic ultrasound for re-evaluation of fibroids.   Seen for recent visit and had microscopic hematuria.  History of right back pain and fibroids confirmed with prior MRI -  2.5 and 2.7 cm.  CT ordered to rule out stones.  Findings were right ovarian cyst and enlarging fibroids.  Feels her menses are normal overall.   CLINICAL DATA: Right flank pain. Hematuria. Previous appendectomy and tubal ligation.  EXAM: CT ABDOMEN AND PELVIS WITHOUT CONTRAST 02/18/15.  TECHNIQUE: Multidetector CT imaging of the abdomen and pelvis was performed following the standard protocol without IV contrast.  COMPARISON: 03/22/2006 from [facility] radiology  FINDINGS: Lower chest: Unremarkable.  Hepatobiliary: No mass visualized on this unenhanced exam. Gallbladder is unremarkable.  Pancreas: No mass or inflammatory process visualized on this unenhanced exam.  Spleen: Within normal limits in size.  Adrenal Glands: No masses identified.  Kidneys/Urinary tract: No evidence of urolithiasis or hydronephrosis.  Stomach/Bowel/Peritoneum: Unremarkable. No inflammatory process or abscess identified.  Vascular/Lymphatic: No pathologically enlarged lymph nodes identified. No other significant abnormality identified.  Reproductive: Enlarged lobulated uterus has increased in size since previous study. This is consistent with fibroids, with largest measuring approximately 5 cm. These are incompletely evaluated on this noncontrast CT. In addition, there is a cystic lesion in the left adnexa measuring approximately 3.5 cm which has higher than simple fluid attenuation and cannot be characterized on this noncontrast study. No free fluid identified.  Other: None.  Musculoskeletal: No suspicious bone lesions identified.  IMPRESSION: No evidence of urolithiasis or hydronephrosis.  Increased size of lobulated uterus,  consistent with fibroids with largest measuring approximately 5 cm. There is also a probable 3.5 cm cystic lesion in the right adnexa which cannot be characterized on this noncontrast study. Further evaluation with pelvic ultrasound is recommended. This recommendation follows ACR consensus guidelines: White Paper of the ACR Incidental Findings Committee II on Adnexal Findings. J Am Coll Radiol [phone].   Electronically Signed  By: [name] M.D.  On: 02/18/2015 15:37  Saw PCP recently and discussed lower back pain.  Will see Interventional radiology and do a cortisone injection.   Will also see urology regarding hematuria.   Started a new healthy eating plan as well.   Feeling better overall.   Objective  Pelvic ultrasound images and report reviewed with patient.  Uterus -  2 fibroids - 3.1 and 3.9 cm.  EMS - 9.02 mm. Ovaries - right ovary with follicles, left ovary with collapsed small CL cyst. Free fluid - no        Assessment  Uterine fibroids -left sided, slightly enlarging.  I do not believe that these are the cause of the patient's back pain.  No evidence of persistent cyst.  Right back pain.  Recent microscopic hematuria.  No evidence of stones on CT.  Plan  Discussion of fibroids. Discussion of ovarian cysts. Recommend completion of plan from PCP for treatment of back pain and further evaluation of hematuria. Re-evaluation of fibroids prn.  Monitor menstrual cycles. Follow up for usual well woman visits.  __25____ minutes face to face time of which over 50% was spent in counseling.   After visit summary to patient.

## 2015-04-05 ENCOUNTER — Ambulatory Visit
Admission: RE | Admit: 2015-04-05 | Discharge: 2015-04-05 | Disposition: A | Discharge status: Home / Self Care | Payer: BLUE CROSS/BLUE SHIELD | Source: Ambulatory Visit | Attending: Family Medicine | Admitting: Family Medicine

## 2015-04-05 DIAGNOSIS — M5416 Radiculopathy, lumbar region: Secondary | ICD-10-CM

## 2015-04-05 MED ORDER — IOHEXOL 180 MG/ML  SOLN
1.0000 mL | Freq: Once | INTRAMUSCULAR | Status: AC | PRN
  Administered 2015-04-05: 1 mL via EPIDURAL

## 2015-04-05 MED ORDER — METHYLPREDNISOLONE ACETATE 40 MG/ML INJ SUSP (RADIOLOG
120.0000 mg | Freq: Once | INTRAMUSCULAR | Status: AC
  Administered 2015-04-05: 120 mg via EPIDURAL

## 2015-04-05 NOTE — Discharge Instructions (Signed)

## 2015-06-17 ENCOUNTER — Other Ambulatory Visit: Payer: Self-pay | Admitting: Family Medicine

## 2015-06-17 DIAGNOSIS — M5416 Radiculopathy, lumbar region: Secondary | ICD-10-CM

## 2015-07-07 ENCOUNTER — Ambulatory Visit
Admission: RE | Admit: 2015-07-07 | Discharge: 2015-07-07 | Disposition: A | Discharge status: Home / Self Care | Payer: BLUE CROSS/BLUE SHIELD | Source: Ambulatory Visit | Attending: Family Medicine | Admitting: Family Medicine

## 2015-07-07 DIAGNOSIS — M5416 Radiculopathy, lumbar region: Secondary | ICD-10-CM

## 2015-07-07 MED ORDER — METHYLPREDNISOLONE ACETATE 40 MG/ML INJ SUSP (RADIOLOG
120.0000 mg | Freq: Once | INTRAMUSCULAR | Status: AC
  Administered 2015-07-07: 120 mg via EPIDURAL

## 2015-07-07 MED ORDER — IOHEXOL 180 MG/ML  SOLN
1.0000 mL | Freq: Once | INTRAMUSCULAR | Status: DC | PRN
  Administered 2015-07-07: 1 mL via EPIDURAL

## 2015-07-07 NOTE — Discharge Instructions (Signed)

## 2016-02-22 ENCOUNTER — Ambulatory Visit: Payer: BLUE CROSS/BLUE SHIELD | Admitting: Obstetrics and Gynecology

## 2016-03-28 ENCOUNTER — Encounter: Payer: Self-pay | Admitting: Obstetrics and Gynecology

## 2016-03-28 ENCOUNTER — Ambulatory Visit (INDEPENDENT_AMBULATORY_CARE_PROVIDER_SITE_OTHER): Payer: BLUE CROSS/BLUE SHIELD | Admitting: Obstetrics and Gynecology

## 2016-03-28 VITALS — BP 110/58 | HR 66 | Resp 22 | Ht 64.0 in | Wt 154.4 lb

## 2016-03-28 DIAGNOSIS — Z0000 Encounter for general adult medical examination without abnormal findings: Secondary | ICD-10-CM

## 2016-03-28 DIAGNOSIS — Z113 Encounter for screening for infections with a predominantly sexual mode of transmission: Secondary | ICD-10-CM | POA: Diagnosis not present

## 2016-03-28 DIAGNOSIS — Z01419 Encounter for gynecological examination (general) (routine) without abnormal findings: Secondary | ICD-10-CM

## 2016-03-28 LAB — POCT URINALYSIS DIPSTICK
Bilirubin, UA: NEGATIVE
Blood, UA: NEGATIVE
Glucose, UA: NEGATIVE
KETONES UA: NEGATIVE
LEUKOCYTES UA: NEGATIVE
Nitrite, UA: NEGATIVE
PH UA: 5
PROTEIN UA: NEGATIVE
Urobilinogen, UA: NEGATIVE

## 2016-03-28 NOTE — Patient Instructions (Signed)
Health Maintenance, Female Adopting a healthy lifestyle and getting preventive care can go a long way to promote health and wellness. Talk with your health care provider about what schedule of regular examinations is right for you. This is a good chance for you to check in with your provider about disease prevention and staying healthy. In between checkups, there are plenty of things you can do on your own. Experts have done a lot of research about which lifestyle changes and preventive measures are most likely to keep you healthy. Ask your health care provider for more information. WEIGHT AND DIET  Eat a healthy diet  Be sure to include plenty of vegetables, fruits, low-fat dairy products, and lean protein.  Do not eat a lot of foods high in solid fats, added sugars, or salt.  Get regular exercise. This is one of the most important things you can do for your health.  Most adults should exercise for at least 150 minutes each week. The exercise should increase your heart rate and make you sweat (moderate-intensity exercise).  Most adults should also do strengthening exercises at least twice a week. This is in addition to the moderate-intensity exercise.  Maintain a healthy weight  Body mass index (BMI) is a measurement that can be used to identify possible weight problems. It estimates body fat based on height and weight. Your health care provider can help determine your BMI and help you achieve or maintain a healthy weight.  For females 20 years of age and older:   A BMI below 18.5 is considered underweight.  A BMI of 18.5 to 24.9 is normal.  A BMI of 25 to 29.9 is considered overweight.  A BMI of 30 and above is considered obese.  Watch levels of cholesterol and blood lipids  You should start having your blood tested for lipids and cholesterol at 53 years of age, then have this test every 5 years.  You may need to have your cholesterol levels checked more often if:  Your lipid  or cholesterol levels are high.  You are older than 53 years of age.  You are at high risk for heart disease.  CANCER SCREENING   Lung Cancer  Lung cancer screening is recommended for adults 55-80 years old who are at high risk for lung cancer because of a history of smoking.  A yearly low-dose CT scan of the lungs is recommended for people who:  Currently smoke.  Have quit within the past 15 years.  Have at least a 30-pack-year history of smoking. A pack year is smoking an average of one pack of cigarettes a day for 1 year.  Yearly screening should continue until it has been 15 years since you quit.  Yearly screening should stop if you develop a health problem that would prevent you from having lung cancer treatment.  Breast Cancer  Practice breast self-awareness. This means understanding how your breasts normally appear and feel.  It also means doing regular breast self-exams. Let your health care provider know about any changes, no matter how small.  If you are in your 20s or 30s, you should have a clinical breast exam (CBE) by a health care provider every 1-3 years as part of a regular health exam.  If you are 40 or older, have a CBE every year. Also consider having a breast X-ray (mammogram) every year.  If you have a family history of breast cancer, talk to your health care provider about genetic screening.  If you   are at high risk for breast cancer, talk to your health care provider about having an MRI and a mammogram every year.  Breast cancer gene (BRCA) assessment is recommended for women who have family members with BRCA-related cancers. BRCA-related cancers include:  Breast.  Ovarian.  Tubal.  Peritoneal cancers.  Results of the assessment will determine the need for genetic counseling and BRCA1 and BRCA2 testing. Cervical Cancer Your health care provider may recommend that you be screened regularly for cancer of the pelvic organs (ovaries, uterus, and  vagina). This screening involves a pelvic examination, including checking for microscopic changes to the surface of your cervix (Pap test). You may be encouraged to have this screening done every 3 years, beginning at age 21.  For women ages 30-65, health care providers may recommend pelvic exams and Pap testing every 3 years, or they may recommend the Pap and pelvic exam, combined with testing for human papilloma virus (HPV), every 5 years. Some types of HPV increase your risk of cervical cancer. Testing for HPV may also be done on women of any age with unclear Pap test results.  Other health care providers may not recommend any screening for nonpregnant women who are considered low risk for pelvic cancer and who do not have symptoms. Ask your health care provider if a screening pelvic exam is right for you.  If you have had past treatment for cervical cancer or a condition that could lead to cancer, you need Pap tests and screening for cancer for at least 20 years after your treatment. If Pap tests have been discontinued, your risk factors (such as having a new sexual partner) need to be reassessed to determine if screening should resume. Some women have medical problems that increase the chance of getting cervical cancer. In these cases, your health care provider may recommend more frequent screening and Pap tests. Colorectal Cancer  This type of cancer can be detected and often prevented.  Routine colorectal cancer screening usually begins at 53 years of age and continues through 53 years of age.  Your health care provider may recommend screening at an earlier age if you have risk factors for colon cancer.  Your health care provider may also recommend using home test kits to check for hidden blood in the stool.  A small camera at the end of a tube can be used to examine your colon directly (sigmoidoscopy or colonoscopy). This is done to check for the earliest forms of colorectal  cancer.  Routine screening usually begins at age 50.  Direct examination of the colon should be repeated every 5-10 years through 53 years of age. However, you may need to be screened more often if early forms of precancerous polyps or small growths are found. Skin Cancer  Check your skin from head to toe regularly.  Tell your health care provider about any new moles or changes in moles, especially if there is a change in a mole's shape or color.  Also tell your health care provider if you have a mole that is larger than the size of a pencil eraser.  Always use sunscreen. Apply sunscreen liberally and repeatedly throughout the day.  Protect yourself by wearing long sleeves, pants, a wide-brimmed hat, and sunglasses whenever you are outside. HEART DISEASE, DIABETES, AND HIGH BLOOD PRESSURE   High blood pressure causes heart disease and increases the risk of stroke. High blood pressure is more likely to develop in:  People who have blood pressure in the high end   of the normal range (130-139/85-89 mm Hg).  People who are overweight or obese.  People who are African American.  If you are 38-23 years of age, have your blood pressure checked every 3-5 years. If you are 61 years of age or older, have your blood pressure checked every year. You should have your blood pressure measured twice--once when you are at a hospital or clinic, and once when you are not at a hospital or clinic. Record the average of the two measurements. To check your blood pressure when you are not at a hospital or clinic, you can use:  An automated blood pressure machine at a pharmacy.  A home blood pressure monitor.  If you are between 45 years and 39 years old, ask your health care provider if you should take aspirin to prevent strokes.  Have regular diabetes screenings. This involves taking a blood sample to check your fasting blood sugar level.  If you are at a normal weight and have a low risk for diabetes,  have this test once every three years after 53 years of age.  If you are overweight and have a high risk for diabetes, consider being tested at a younger age or more often. PREVENTING INFECTION  Hepatitis B  If you have a higher risk for hepatitis B, you should be screened for this virus. You are considered at high risk for hepatitis B if:  You were born in a country where hepatitis B is common. Ask your health care provider which countries are considered high risk.  Your parents were born in a high-risk country, and you have not been immunized against hepatitis B (hepatitis B vaccine).  You have HIV or AIDS.  You use needles to inject street drugs.  You live with someone who has hepatitis B.  You have had sex with someone who has hepatitis B.  You get hemodialysis treatment.  You take certain medicines for conditions, including cancer, organ transplantation, and autoimmune conditions. Hepatitis C  Blood testing is recommended for:  Everyone born from [birth year] through 1963.  Anyone with known risk factors for hepatitis C. Sexually transmitted infections (STIs)  You should be screened for sexually transmitted infections (STIs) including gonorrhea and chlamydia if:  You are sexually active and are younger than 53 years of age.  You are older than 53 years of age and your health care provider tells you that you are at risk for this type of infection.  Your sexual activity has changed since you were last screened and you are at an increased risk for chlamydia or gonorrhea. Ask your health care provider if you are at risk.  If you do not have HIV, but are at risk, it may be recommended that you take a prescription medicine daily to prevent HIV infection. This is called pre-exposure prophylaxis (PrEP). You are considered at risk if:  You are sexually active and do not regularly use condoms or know the HIV status of your partner(s).  You take drugs by injection.  You are sexually  active with a partner who has HIV. Talk with your health care provider about whether you are at high risk of being infected with HIV. If you choose to begin PrEP, you should first be tested for HIV. You should then be tested every 3 months for as long as you are taking PrEP.  PREGNANCY   If you are premenopausal and you may become pregnant, ask your health care provider about preconception counseling.  If you may  become pregnant, take 400 to 800 micrograms (mcg) of folic acid every day.  If you want to prevent pregnancy, talk to your health care provider about birth control (contraception). OSTEOPOROSIS AND MENOPAUSE   Osteoporosis is a disease in which the bones lose minerals and strength with aging. This can result in serious bone fractures. Your risk for osteoporosis can be identified using a bone density scan.  If you are 61 years of age or older, or if you are at risk for osteoporosis and fractures, ask your health care provider if you should be screened.  Ask your health care provider whether you should take a calcium or vitamin D supplement to lower your risk for osteoporosis.  Menopause may have certain physical symptoms and risks.  Hormone replacement therapy may reduce some of these symptoms and risks. Talk to your health care provider about whether hormone replacement therapy is right for you.  HOME CARE INSTRUCTIONS   Schedule regular health, dental, and eye exams.  Stay current with your immunizations.   Do not use any tobacco products including cigarettes, chewing tobacco, or electronic cigarettes.  If you are pregnant, do not drink alcohol.  If you are breastfeeding, limit how much and how often you drink alcohol.  Limit alcohol intake to no more than 1 drink per day for nonpregnant women. One drink equals 12 ounces of beer, 5 ounces of wine, or 1 ounces of hard liquor.  Do not use street drugs.  Do not share needles.  Ask your health care provider for help if  you need support or information about quitting drugs.  Tell your health care provider if you often feel depressed.  Tell your health care provider if you have ever been abused or do not feel safe at home.   This information is not intended to replace advice given to you by your health care provider. Make sure you discuss any questions you have with your health care provider.   Document Released: 04/09/2011 Document Revised: 10/15/2014 Document Reviewed: 08/26/2013 Elsevier Interactive Patient Education [employer].

## 2016-03-28 NOTE — Progress Notes (Signed)
Patient ID: Lisa Wade, female   DOB: [birth year], 55 y.o.   MRN: [id number] 55 y.o. G34P2002 Widowed Caucasian female here for annual exam.    Has a new partner.  Together since August 2016. Just did a commitment ceremony so really happy about this.   Menses are every 24 days or so.  No bleeding in between menses. No skipped cycles. No heavy menses.  Has had lower back pain prior to onset of her menses. Had MRIs to evaluate the pain.  Stopped taking narcotics for the pain.  Seems to have dissipated.  Pelvic ultrasound - 03/31/15:  Uterus - 2 fibroids - 3.1 and 3.9 cm.  EMS - 9.02 mm. Ovaries - right ovary with follicles, left ovary with collapsed small CL cyst. Free fluid - no  PCP:  Derinda Late, MD    Patient's last menstrual period was 03/09/2016 (exact date).     Period Cycle (Days): 24 Period Duration (Days): 5 Period Pattern: Regular Menstrual Flow:  (heavy 1-2 days then light) Menstrual Control: Tampon Menstrual Control Change Freq (Hours): every 2 hrs on heaviest day Dysmenorrhea: (!) Moderate (has back pain also)     Sexually active: Yes.  female  The current method of family planning is tubal ligation.    Exercising: No.   Smoker:  no  Health Maintenance: Pap:  02-04-14 Neg:Neg HR HPV History of abnormal Pap:  Yes, Hx CIN I 1991 MMG:  03-21-15 Saline implants/3D/Density D/Neg/BiRads1:[facility] Colonoscopy: 2015 normal with Dr. Brita Romp due 2025 BMD:   n/a  Result  n/a TDaP:  Up to date with PCP Gardasil:   N/A Hep C:  Today.  HIV:  Today.  Screening Labs:  Hb today: PCP, Urine today: Neg   reports that she has never smoked. She has never used smokeless tobacco. She reports that she drinks about 1.2 oz of alcohol per week. She reports that she does not use illicit drugs.  Past Medical History  Diagnosis Date  . Dysplasia of cervix, low grade (CIN 1) 1991  . C. difficile colitis   . Chondromalacia   . Social anxiety disorder 1998  . HSV-1  infection 2010    Past Surgical History  Procedure Laterality Date  . Tubal ligation    . Breast enhancement surgery  2008    Saline Harlow Mares)  . Appendectomy      Current Outpatient Prescriptions  Medication Sig Dispense Refill  . ALPRAZolam (XANAX) 0.5 MG tablet Take 0.5 mg by mouth at bedtime as needed for sleep.    . Amphetamine-Dextroamphetamine (ADDERALL PO) Take by mouth.    . escitalopram (LEXAPRO) 20 MG tablet Take 20 mg by mouth daily.    . fluconazole (DIFLUCAN) 150 MG tablet Take 1 tablet by mouth as needed.  0  . HYDROcodone-acetaminophen (NORCO/VICODIN) 5-325 MG per tablet Take 1 tablet by mouth every 6 (six) hours as needed for moderate pain.    Marland Kitchen imipramine (TOFRANIL) 10 MG tablet Take 1 tablet by mouth at bedtime as needed.  0  . valACYclovir (VALTREX) 500 MG tablet Take 500 mg by mouth 2 (two) times daily.     No current facility-administered medications for this visit.    Family History  Problem Relation Age of Onset  . Cancer Father     lung  . Cancer Brother     testicular cancer  . Osteoarthritis Mother   . Migraines Mother   . Thyroid disease Mother   . Osteoarthritis Maternal Grandmother   .  Hypertension Paternal Grandmother   . Stroke Paternal Grandmother     ROS:  Pertinent items are noted in HPI.  Otherwise, a comprehensive ROS was negative.  Exam:   BP 110/58 mmHg  Pulse 66  Resp 22  Ht 5\' 4"  (1.626 m)  Wt 154 lb 6.4 oz (70.035 kg)  BMI 26.49 kg/m2  LMP 03/09/2016 (Exact Date)    General appearance: alert, cooperative and appears stated age Head: Normocephalic, without obvious abnormality, atraumatic Neck: no adenopathy, supple, symmetrical, trachea midline and thyroid normal to inspection and palpation Lungs: clear to auscultation bilaterally Breasts: normal appearance, no masses or tenderness, Inspection negative, No nipple retraction or dimpling, No nipple discharge or bleeding, No axillary or supraclavicular adenopathy.  Consistent  with implants. Heart: regular rate and rhythm Abdomen  soft, non-tender; no masses, no organomegaly Extremities: extremities normal, atraumatic, no cyanosis or edema Skin: Skin color, texture, turgor normal. No rashes or lesions Lymph nodes: Cervical, supraclavicular, and axillary nodes normal. No abnormal inguinal nodes palpated Neurologic: Grossly normal  Pelvic: External genitalia:  no lesions              Urethra:  normal appearing urethra with no masses, tenderness or lesions              Bartholins and Skenes: normal                 Vagina: normal appearing vagina with normal color and discharge, no lesions              Cervix: no lesions              Pap taken: Yes.   Bimanual Exam:  Uterus:  enlarged, 7 weeks size.  Irregular texture consistent with fibroids.              Adnexa: no mass, fullness, tenderness              Rectal exam: Yes.  .  Confirms.              Anus:  normal sphincter tone, no lesions  Chaperone was present for exam.  Assessment:   Well woman visit with normal exam. Remote hx of abnormal pap. Uterine fibroids.  Stable on exam. STD screening.  Hx HSV I.  Plan: Yearly mammogram recommended after age 17.  Recommended self breast exam.  Pap and HR HPV as above. Discussed Calcium, Vitamin D, regular exercise program including cardiovascular and weight bearing exercise. Labs performed.  Yes.  .   See orders.  STD screening. Prescription medication(s) given.  No..   No ultrasound is needed at this time. Follow up annually and prn.     After visit summary provided.

## 2016-03-29 LAB — STD PANEL
HIV 1&2 Ab, 4th Generation: NONREACTIVE
Hepatitis B Surface Ag: NEGATIVE

## 2016-03-29 LAB — HEPATITIS C ANTIBODY: HCV AB: NEGATIVE

## 2016-03-30 LAB — IPS N GONORRHOEA AND CHLAMYDIA BY PCR

## 2016-03-30 LAB — IPS PAP TEST WITH HPV

## 2016-04-23 ENCOUNTER — Other Ambulatory Visit: Payer: Self-pay | Admitting: Obstetrics and Gynecology

## 2016-04-23 DIAGNOSIS — Z1231 Encounter for screening mammogram for malignant neoplasm of breast: Secondary | ICD-10-CM

## 2016-05-07 ENCOUNTER — Ambulatory Visit
Admission: RE | Admit: 2016-05-07 | Discharge: 2016-05-07 | Disposition: A | Discharge status: Home / Self Care | Payer: BLUE CROSS/BLUE SHIELD | Source: Ambulatory Visit | Attending: Obstetrics and Gynecology | Admitting: Obstetrics and Gynecology

## 2016-05-07 DIAGNOSIS — Z1231 Encounter for screening mammogram for malignant neoplasm of breast: Secondary | ICD-10-CM

## 2016-06-21 ENCOUNTER — Telehealth: Payer: Self-pay | Admitting: Obstetrics and Gynecology

## 2016-06-21 NOTE — Telephone Encounter (Signed)
Thank you for having the patient come in earlier for care tomorrow! This is a much better plan.  I agree with your recommendations.

## 2016-06-21 NOTE — Telephone Encounter (Signed)
I would like to see patient today if possible and try to do a pelvic ultrasound or see her in the earlier part of the morning tomorrow in case I need to send her to the hospital for an ultrasound as we do not have ultrasound capability on Fridays.

## 2016-06-21 NOTE — Telephone Encounter (Signed)
Spoke with patient. Patient states that she has been experiencing intermittent right sided pelvic paint that extends to her lower back with lying down at night. Reports pain is a sharp stabbing. Reports she has had the pain once during the day a few days ago, but has not had any further pain during the day since, pain is most frequent with lying down. When pain occurs it is a 7/10. Patient has not taken any medication for the pain. Denies any current pain, nausea, fever, or chills. Reports she is having regular BMs. Patient has a history of a right ovarian cyst and fibroids on CT scan 02/2015. Last PUS in the office was on 03/31/2015 with Dr.Silva and showed Uterus -  2 fibroids - 3.1 and 3.9 cm, EMS - 9.02 mm. Ovaries - right ovary with follicles, left ovary with collapsed small CL cyst, Free fluid - no. Advised patient she will need to be seen in the office for further evaluation. Patient is agreeable. Appointment scheduled for tomorrow 06/22/2016 at 3:45 pm with Dr.Silva. Patient declines earlier appointment. Advised she may take 600-800 mg of Ibuprofen/Motrin every 6-8 hours for pain. Advised if pain increases, starts to occur more frequently, or develops new symptoms she will need to be seen in the office earlier or at local ER. Patient is agreeable.  Routing to Dr.Silva for review.

## 2016-06-21 NOTE — Telephone Encounter (Signed)
Patient states she has an ovarian cyst and is having a lot of pain at night.

## 2016-06-21 NOTE — Telephone Encounter (Addendum)
Spoke with patient. Advised of message as seen below from Bailey. Patient states that with her schedule she is unable to be seen in the office early. Emphasizes the importance of patient being seen earlier due to symptoms and need for PUS. Patient again declines appointment for today. Advised of importance of early appointment tomorrow in case she needs to be sent for further imaging evaluation. Appointment rescheduled to tomorrow 06/22/2016 at 9:30 am. Patient is agreeable to date and time. Precautions given to patient that if pain worsens or develops new symptoms she will need to be seen earlier in the office for evaluation or at a local ER if this occurs after office hours. Patient is agreeable.  Routing to provider for final review. Patient agreeable to disposition.

## 2016-06-22 ENCOUNTER — Encounter: Payer: Self-pay | Admitting: Obstetrics and Gynecology

## 2016-06-22 ENCOUNTER — Ambulatory Visit: Payer: BLUE CROSS/BLUE SHIELD | Admitting: Obstetrics and Gynecology

## 2016-06-22 ENCOUNTER — Ambulatory Visit (INDEPENDENT_AMBULATORY_CARE_PROVIDER_SITE_OTHER): Payer: BLUE CROSS/BLUE SHIELD | Admitting: Obstetrics and Gynecology

## 2016-06-22 VITALS — BP 104/58 | HR 84 | Temp 98.2°F | Ht 64.0 in | Wt 161.0 lb

## 2016-06-22 DIAGNOSIS — R319 Hematuria, unspecified: Secondary | ICD-10-CM

## 2016-06-22 DIAGNOSIS — N926 Irregular menstruation, unspecified: Secondary | ICD-10-CM

## 2016-06-22 DIAGNOSIS — R102 Pelvic and perineal pain: Secondary | ICD-10-CM | POA: Diagnosis not present

## 2016-06-22 DIAGNOSIS — R1031 Right lower quadrant pain: Secondary | ICD-10-CM

## 2016-06-22 LAB — POCT URINALYSIS DIPSTICK
Bilirubin, UA: NEGATIVE
Glucose, UA: NEGATIVE
Ketones, UA: NEGATIVE
Leukocytes, UA: NEGATIVE
Nitrite, UA: NEGATIVE
PH UA: 5
PROTEIN UA: NEGATIVE
Urobilinogen, UA: NEGATIVE

## 2016-06-22 LAB — POCT URINE PREGNANCY: PREG TEST UR: NEGATIVE

## 2016-06-22 NOTE — Progress Notes (Signed)
GYNECOLOGY  VISIT   HPI: 55 y.o.   Widowed  Caucasian  female   G2P2002 with Patient's last menstrual period was 06/09/2016 (exact date).   here for right sided pelvic pain which radiates down leg and occasional to lower back x3 weeks. Denies nausea, diarrhea or fever. Pain is worse at night when lying down and it wakes her up.  Pain occurs every night when she lies down.  Discomfort is not so significant during the day.  Pain is always on the right side, and she has had this chronically. The pain now feels different from her usual pain.  Vacuuming and picking up things may be agrevating this. Has an Rx of hydrocodone which she lost temporarily so she did not use this. Aleve helps pain.   No fevers, nausea, vomiting, diarrhea, or painful urination.  No hx renal stones.   Menses are a little irregular.  Can occur every 3 instead of 4 weeks.  July - in the last week. August - mid month. This was actually 3 weeks after July cycle).  Hx of fibroids and ovarian cysts.  Pelvic ultrasound done 03/30/16: Uterus -  2 fibroids - 3.1 and 3.9 cm.  EMS - 9.02 mm. Ovaries - right ovary with follicles, left ovary with collapsed small CL cyst. Free fluid - no  Temp: 98.2   Urine Dip: Trace RBCs UPT: Neg  GYNECOLOGIC HISTORY: Patient's last menstrual period was 06/09/2016 (exact date). Contraception:  Tubal Ligation Menopausal hormone therapy:  none Last mammogram:  05-07-16 D[facility] C/Neg/biRads1:[facility] Last pap smear: 03-28-16  Neg:Neg HR HPV        OB History    Gravida Para Term Preterm AB Living   2 2 2     2    SAB TAB Ectopic Multiple Live Births                     Patient Active Problem List   Diagnosis Date Noted  . Social anxiety disorder     Past Medical History:  Diagnosis Date  . C. difficile colitis   . Chondromalacia   . Dysplasia of cervix, low grade (CIN 1) 1991  . HSV-1 infection 2010  . Social anxiety disorder 1998    Past Surgical History:   Procedure Laterality Date  . APPENDECTOMY    . BREAST ENHANCEMENT SURGERY  2008   [facility])  . TUBAL LIGATION      Current Outpatient Prescriptions  Medication Sig Dispense Refill  . ALPRAZolam (XANAX) 0.5 MG tablet Take 0.5 mg by mouth at bedtime as needed for sleep.    . Amphetamine-Dextroamphetamine (ADDERALL PO) Take 1 tablet by mouth.     . escitalopram (LEXAPRO) 20 MG tablet Take 20 mg by mouth daily.    Marland Kitchen HYDROcodone-acetaminophen (NORCO/VICODIN) 5-325 MG per tablet Take 1 tablet by mouth every 6 (six) hours as needed for moderate pain.    Marland Kitchen imipramine (TOFRANIL) 10 MG tablet Take 1 tablet by mouth at bedtime as needed.  0  . valACYclovir (VALTREX) 500 MG tablet Take 500 mg by mouth 2 (two) times daily.     No current facility-administered medications for this visit.      ALLERGIES: Cephalosporins  Family History  Problem Relation Age of Onset  . Cancer Father     lung  . Cancer Brother     testicular cancer  . Osteoarthritis Mother   . Migraines Mother   . Thyroid disease Mother   . Osteoarthritis  Maternal Grandmother   . Hypertension Paternal Grandmother   . Stroke Paternal Grandmother     Social History   Social History  . Marital status: Widowed    Spouse name: N/A  . Number of children: N/A  . Years of education: N/A   Occupational History  . Not on file.   Social History Main Topics  . Smoking status: Never Smoker  . Smokeless tobacco: Never Used  . Alcohol use 1.2 oz/week    2 Standard drinks or equivalent per week     Comment: 1-2 glasses of wine per week  . Drug use: No  . Sexual activity: Yes    Partners: Male    Birth control/ protection: Surgical     Comment: BTL   Other Topics Concern  . Not on file   Social History Narrative  . No narrative on file    ROS:  Pertinent items are noted in HPI.  PHYSICAL EXAMINATION:    BP (!) 104/58 (BP Location: Right Arm, Patient Position: Sitting, Cuff Size: Normal)   Pulse 84   Temp  98.2 F (36.8 C) (Oral)   Ht 5\' 4"  (1.626 m)   Wt 161 lb (73 kg)   LMP 06/09/2016 (Exact Date) Comment: 1 week early  BMI 27.64 kg/m     General appearance: alert, cooperative and appears stated age.  Abdomen: soft, non-tender, no masses,  no organomegaly Back:  No CVA tenderness.  Pelvic: External genitalia:  no lesions              Urethra:  normal appearing urethra with no masses, tenderness or lesions              Bartholins and Skenes: normal                 Vagina: normal appearing vagina with normal color and discharge, no lesions              Cervix: no lesions                Bimanual Exam:  Uterus:  normal size, contour, position, consistency, mobility, non-tender              Adnexa: no mass, fullness, tenderness              Rectal exam: Yes.  .  Confirms.              Anus:  normal sphincter tone, no lesions  Chaperone was present for exam.  ASSESSMENT  Right lower quadrant abdominal pain.  No acute abdomen.  Known uterine fibroids.  Irregular menses.  Negative UPT. Microscopic hematuria.  PLAN  Urine micro and culture. Will proceed with pelvic ultrasound today or Monday to roule out GYN causes of pain.   This may be musculoskeletal from increased activity.  Aleve and heat for pain control.  Call for worsening pain.     An After Visit Summary was printed and given to the patient.  _15_____ minutes face to face time of which over 50% was spent in counseling.

## 2016-06-23 LAB — URINALYSIS, MICROSCOPIC ONLY
BACTERIA UA: NONE SEEN [HPF]
CRYSTALS: NONE SEEN [HPF]
Casts: NONE SEEN [LPF]
Squamous Epithelial / LPF: NONE SEEN [HPF] (ref <=5)
WBC UA: NONE SEEN WBC/HPF (ref <=5)
Yeast: NONE SEEN [HPF]

## 2016-06-24 LAB — URINE CULTURE: ORGANISM ID, BACTERIA: NO GROWTH

## 2016-06-25 ENCOUNTER — Ambulatory Visit (HOSPITAL_COMMUNITY)
Admission: RE | Admit: 2016-06-25 | Discharge: 2016-06-25 | Disposition: A | Discharge status: Home / Self Care | Payer: BLUE CROSS/BLUE SHIELD | Source: Ambulatory Visit | Attending: Obstetrics and Gynecology | Admitting: Obstetrics and Gynecology

## 2016-06-25 DIAGNOSIS — R1031 Right lower quadrant pain: Secondary | ICD-10-CM | POA: Insufficient documentation

## 2016-06-25 DIAGNOSIS — D252 Subserosal leiomyoma of uterus: Secondary | ICD-10-CM | POA: Diagnosis not present

## 2016-06-25 DIAGNOSIS — N83201 Unspecified ovarian cyst, right side: Secondary | ICD-10-CM | POA: Diagnosis not present

## 2016-06-25 DIAGNOSIS — N854 Malposition of uterus: Secondary | ICD-10-CM | POA: Insufficient documentation

## 2016-07-04 ENCOUNTER — Ambulatory Visit (INDEPENDENT_AMBULATORY_CARE_PROVIDER_SITE_OTHER): Payer: BLUE CROSS/BLUE SHIELD | Admitting: Obstetrics and Gynecology

## 2016-07-04 VITALS — Ht 64.0 in

## 2016-07-04 DIAGNOSIS — N83201 Unspecified ovarian cyst, right side: Secondary | ICD-10-CM | POA: Diagnosis not present

## 2016-07-04 DIAGNOSIS — D259 Leiomyoma of uterus, unspecified: Secondary | ICD-10-CM

## 2016-07-04 NOTE — Progress Notes (Signed)
GYNECOLOGY  VISIT   HPI: 55 y.o.   Widowed  Caucasian  female   G2P2002 with Patient's last menstrual period was 06/09/2016 (exact date).   here to discuss ultrasound results.   Ultrasound ordered due to right sided pain (different from her baseline) and hx of fibroids.  Pain is better overall now.  Her right sided back pain not really responsive to steroid injections.   Pelvic ultrasound report - 06/25/16: CLINICAL DATA:  Right lower quadrant pain for 3 weeks. Fibroids. Personal history of ovarian cysts. LMP 06/09/2016  EXAM: TRANSABDOMINAL AND TRANSVAGINAL ULTRASOUND OF PELVIS  TECHNIQUE: Both transabdominal and transvaginal ultrasound examinations of the pelvis were performed. Transabdominal technique was performed for global imaging of the pelvis including uterus, ovaries, adnexal regions, and pelvic cul-de-sac. It was necessary to proceed with endovaginal exam following the transabdominal exam to visualize the endometrial stripe and ovaries.  COMPARISON:  03/31/2015 from [place]: Uterus  Measurements: 9.7 x 7.9 x 9.6 cm. Retroverted. Several uterine fibroids are seen, largest of which are subserosal in location in the posterior corpus and fundus. These measure 5.5 cm and 4.3 cm in maximum diameter. At least 3 other smaller fibroids are seen ranging in size from 1-2 cm.  Endometrium  Thickness: 12 mm.  No focal abnormality visualized.  Right ovary  Measurements: 3.5 by 2.2 x 4.0 cm. A complex cyst is seen which contains low-level internal echoes, several thin internal septations, and several tiny mural nodular densities. This measures 2.8 cm in maximum diameter, and has indeterminate but probably benign characteristics. Differential diagnosis includes atypical hemorrhagic cyst, endometrioma, and cystic ovarian neoplasm.  Left ovary  Measurements: 3.6 x 2.6 x 2.3 cm. Normal appearance/no adnexal mass.  Other  findings  No abnormal free fluid.  IMPRESSION: Multiple uterine fibroids, largest measuring 5.5 cm, which appear increased in size and number compared to previous study.  2.8 cm complex right ovarian cyst, with indeterminate but probably benign characteristics. Followup by transvaginal pelvic ultrasound is recommended in 6-12 weeks. This recommendation follows the consensus statement: Management of Asymptomatic Ovarian and Other Adnexal Cysts Imaged at [place]: Society of Radiologists in [place]. Radiology 2010; [phone].   Electronically Signed   By: [name] M.D.   On: 06/25/2016 17:21    GYNECOLOGIC HISTORY: Patient's last menstrual period was 06/09/2016 (exact date). Contraception:  BTL Menopausal hormone therapy:  none Last mammogram:  05/08/16 [facility] Last pap smear: 03/28/16 WNL neg HR HPV        OB History    Gravida Para Term Preterm AB Living   2 2 2     2    SAB TAB Ectopic Multiple Live Births                     Patient Active Problem List   Diagnosis Date Noted  . Social anxiety disorder     Past Medical History:  Diagnosis Date  . C. difficile colitis   . Chondromalacia   . Dysplasia of cervix, low grade (CIN 1) 1991  . Fibroid   . HSV-1 infection 2010  . Lumbar spondylosis   . Social anxiety disorder 1998    Past Surgical History:  Procedure Laterality Date  . APPENDECTOMY    . BREAST ENHANCEMENT SURGERY  2008   [facility])  . TUBAL LIGATION      Current Outpatient Prescriptions  Medication Sig Dispense Refill  . ALPRAZolam (XANAX) 0.5 MG  tablet Take 0.5 mg by mouth at bedtime as needed for sleep.    . Amphetamine-Dextroamphetamine (ADDERALL PO) Take 1 tablet by mouth.     . escitalopram (LEXAPRO) 20 MG tablet Take 20 mg by mouth daily.    Marland Kitchen HYDROcodone-acetaminophen (NORCO/VICODIN) 5-325 MG per tablet Take 1 tablet by mouth every 6 (six) hours as needed for moderate  pain.    Marland Kitchen imipramine (TOFRANIL) 10 MG tablet Take 1 tablet by mouth at bedtime as needed.  0  . valACYclovir (VALTREX) 500 MG tablet Take 500 mg by mouth 2 (two) times daily.     No current facility-administered medications for this visit.      ALLERGIES: Cephalosporins  Family History  Problem Relation Age of Onset  . Cancer Father     lung  . Cancer Brother     testicular cancer  . Osteoarthritis Mother   . Migraines Mother   . Thyroid disease Mother   . Osteoarthritis Maternal Grandmother   . Hypertension Paternal Grandmother   . Stroke Paternal Grandmother     Social History   Social History  . Marital status: Widowed    Spouse name: N/A  . Number of children: N/A  . Years of education: N/A   Occupational History  . Not on file.   Social History Main Topics  . Smoking status: Never Smoker  . Smokeless tobacco: Never Used  . Alcohol use 1.2 oz/week    2 Standard drinks or equivalent per week     Comment: 1-2 glasses of wine per week  . Drug use: No  . Sexual activity: Yes    Partners: Male    Birth control/ protection: Surgical     Comment: BTL   Other Topics Concern  . Not on file   Social History Narrative  . No narrative on file    ROS:  Pertinent items are noted in HPI.  PHYSICAL EXAMINATION:    Ht 5\' 4"  (1.626 m)   LMP 06/09/2016 (Exact Date) Comment: 1 week early    General appearance: alert, cooperative and appears stated age    ASSESSMENT  Complex right ovarian cyst.  Uterine fibroids increasing in size and number. Chronic right sided back pain with current change in her usual pain.  Fibroids may be contributing to her back/side pain at this point.   PLAN  We discussed the right ovarian cyst and potential etiologies.  Will get a CA125 today.  If CA125 is normal, follow up ultrasound in 6 weeks.  Patient understands the importance.  If CA125 elevated, referral to [place] for surgical care. Uterine fibroids discussed as well.  We  reviewed the natural hx of fibroids and signs and symptoms related to them.  Fibroid volume and symptoms can be treated with hysterectomy.    An After Visit Summary was printed and given to the patient.  __25___ minutes face to face time of which over 50% was spent in counseling.

## 2016-07-04 NOTE — Patient Instructions (Signed)
Ovarian Cyst An ovarian cyst is a fluid-filled sac that forms on an ovary. The ovaries are small organs that produce eggs in women. Various types of cysts can form on the ovaries. Most are not cancerous. Many do not cause problems, and they often go away on their own. Some may cause symptoms and require treatment. Common types of ovarian cysts include:  Functional cysts--These cysts may occur every month during the menstrual cycle. This is normal. The cysts usually go away with the next menstrual cycle if the woman does not get pregnant. Usually, there are no symptoms with a functional cyst.  Endometrioma cysts--These cysts form from the tissue that lines the uterus. They are also called "chocolate cysts" because they become filled with blood that turns brown. This type of cyst can cause pain in the lower abdomen during intercourse and with your menstrual period.  Cystadenoma cysts--This type develops from the cells on the outside of the ovary. These cysts can get very big and cause lower abdomen pain and pain with intercourse. This type of cyst can twist on itself, cut off its blood supply, and cause severe pain. It can also easily rupture and cause a lot of pain.  Dermoid cysts--This type of cyst is sometimes found in both ovaries. These cysts may contain different kinds of body tissue, such as skin, teeth, hair, or cartilage. They usually do not cause symptoms unless they get very big.  Theca lutein cysts--These cysts occur when too much of a certain hormone (human chorionic gonadotropin) is produced and overstimulates the ovaries to produce an egg. This is most common after procedures used to assist with the conception of a baby (in vitro fertilization). CAUSES   Fertility drugs can cause a condition in which multiple large cysts are formed on the ovaries. This is called ovarian hyperstimulation syndrome.  A condition called polycystic ovary syndrome can cause hormonal imbalances that can lead to  nonfunctional ovarian cysts. SIGNS AND SYMPTOMS  Many ovarian cysts do not cause symptoms. If symptoms are present, they may include:  Pelvic pain or pressure.  Pain in the lower abdomen.  Pain during sexual intercourse.  Increasing girth (swelling) of the abdomen.  Abnormal menstrual periods.  Increasing pain with menstrual periods.  Stopping having menstrual periods without being pregnant. DIAGNOSIS  These cysts are commonly found during a routine or annual pelvic exam. Tests may be ordered to find out more about the cyst. These tests may include:  Ultrasound.  X-ray of the pelvis.  CT scan.  MRI.  Blood tests. TREATMENT  Many ovarian cysts go away on their own without treatment. Your health care provider may want to check your cyst regularly for 2-3 months to see if it changes. For women in menopause, it is particularly important to monitor a cyst closely because of the higher rate of ovarian cancer in menopausal women. When treatment is needed, it may include any of the following:  A procedure to drain the cyst (aspiration). This may be done using a long needle and ultrasound. It can also be done through a laparoscopic procedure. This involves using a thin, lighted tube with a tiny camera on the end (laparoscope) inserted through a small incision.  Surgery to remove the whole cyst. This may be done using laparoscopic surgery or an open surgery involving a larger incision in the lower abdomen.  Hormone treatment or birth control pills. These methods are sometimes used to help dissolve a cyst. HOME CARE INSTRUCTIONS   Only take over-the-counter   or prescription medicines as directed by your health care provider.  Follow up with your health care provider as directed.  Get regular pelvic exams and Pap tests. SEEK MEDICAL CARE IF:   Your periods are late, irregular, or painful, or they stop.  Your pelvic pain or abdominal pain does not go away.  Your abdomen becomes  larger or swollen.  You have pressure on your bladder or trouble emptying your bladder completely.  You have pain during sexual intercourse.  You have feelings of fullness, pressure, or discomfort in your stomach.  You lose weight for no apparent reason.  You feel generally ill.  You become constipated.  You lose your appetite.  You develop acne.  You have an increase in body and facial hair.  You are gaining weight, without changing your exercise and eating habits.  You think you are pregnant. SEEK IMMEDIATE MEDICAL CARE IF:   You have increasing abdominal pain.  You feel sick to your stomach (nauseous), and you throw up (vomit).  You develop a fever that comes on suddenly.  You have abdominal pain during a bowel movement.  Your menstrual periods become heavier than usual. MAKE SURE YOU:  Understand these instructions.  Will watch your condition.  Will get help right away if you are not doing well or get worse.   This information is not intended to replace advice given to you by your health care provider. Make sure you discuss any questions you have with your health care provider.   Document Released: 09/24/2005 Document Revised: 09/29/2013 Document Reviewed: 06/01/2013 Elsevier Interactive Patient Education 2016 [employer]  

## 2016-07-05 ENCOUNTER — Encounter: Payer: Self-pay | Admitting: Obstetrics and Gynecology

## 2016-07-05 DIAGNOSIS — N83201 Unspecified ovarian cyst, right side: Secondary | ICD-10-CM | POA: Insufficient documentation

## 2016-07-05 LAB — CA 125: CA 125: 11 U/mL (ref <35)

## 2016-08-10 ENCOUNTER — Telehealth: Payer: Self-pay | Admitting: Obstetrics and Gynecology

## 2016-08-10 NOTE — Telephone Encounter (Signed)
Call to patient to reschedule ultrasound on 08/16/16 for later the same day. Left voicemail for patient to return call.

## 2016-08-16 ENCOUNTER — Other Ambulatory Visit: Payer: BLUE CROSS/BLUE SHIELD | Admitting: Obstetrics and Gynecology

## 2016-08-16 ENCOUNTER — Encounter: Payer: Self-pay | Admitting: Obstetrics and Gynecology

## 2016-08-16 ENCOUNTER — Ambulatory Visit (INDEPENDENT_AMBULATORY_CARE_PROVIDER_SITE_OTHER): Payer: BLUE CROSS/BLUE SHIELD

## 2016-08-16 ENCOUNTER — Other Ambulatory Visit: Payer: BLUE CROSS/BLUE SHIELD

## 2016-08-16 ENCOUNTER — Ambulatory Visit (INDEPENDENT_AMBULATORY_CARE_PROVIDER_SITE_OTHER): Payer: BLUE CROSS/BLUE SHIELD | Admitting: Obstetrics and Gynecology

## 2016-08-16 VITALS — BP 100/62 | HR 70 | Ht 64.0 in | Wt 161.0 lb

## 2016-08-16 DIAGNOSIS — N83201 Unspecified ovarian cyst, right side: Secondary | ICD-10-CM | POA: Diagnosis not present

## 2016-08-16 DIAGNOSIS — M25551 Pain in right hip: Secondary | ICD-10-CM | POA: Diagnosis not present

## 2016-08-16 DIAGNOSIS — D259 Leiomyoma of uterus, unspecified: Secondary | ICD-10-CM | POA: Diagnosis not present

## 2016-08-16 MED ORDER — NORETHINDRONE 0.35 MG PO TABS: 1.0000 | ORAL_TABLET | Freq: Every day | ORAL | 3 refills | Status: DC

## 2016-08-16 NOTE — Progress Notes (Signed)
Patient ID: Lisa Wade, female   DOB: April 08, 1961, 55 y.o.   MRN: [id number] GYNECOLOGY  VISIT   HPI: 55 y.o.   Widowed  Caucasian  female   G2P2002 with Patient's last menstrual period was 08/02/2016 (exact date).   here for pelvic ultrasound to follow up on Rt.ovarian cyst.    Partner is here for discussion today.   Pelvic ultrasound report - 06/25/16: CLINICAL DATA: Right lower quadrant pain for 3 weeks. Fibroids. Personal history of ovarian cysts. LMP 06/09/2016  EXAM: TRANSABDOMINAL AND TRANSVAGINAL ULTRASOUND OF PELVIS  TECHNIQUE: Both transabdominal and transvaginal ultrasound examinations of the pelvis were performed. Transabdominal technique was performed for global imaging of the pelvis including uterus, ovaries, adnexal regions, and pelvic cul-de-sac. It was necessary to proceed with endovaginal exam following the transabdominal exam to visualize the endometrial stripe and ovaries.  COMPARISON: 03/31/2015 from [facility]: Uterus  Measurements: 9.7 x 7.9 x 9.6 cm. Retroverted. Several uterine fibroids are seen, largest of which are subserosal in location in the posterior corpus and fundus. These measure 5.5 cm and 4.3 cm in maximum diameter. At least 3 other smaller fibroids are seen ranging in size from 1-2 cm.  Endometrium  Thickness: 12 mm. No focal abnormality visualized.  Right ovary  Measurements: 3.5 by 2.2 x 4.0 cm. A complex cyst is seen which contains low-level internal echoes, several thin internal septations, and several tiny mural nodular densities. This measures 2.8 cm in maximum diameter, and has indeterminate but probably benign characteristics. Differential diagnosis includes atypical hemorrhagic cyst, endometrioma, and cystic ovarian neoplasm.  Left ovary  Measurements: 3.6 x 2.6 x 2.3 cm. Normal appearance/no adnexal mass.  Other findings  No abnormal free  fluid.  IMPRESSION: Multiple uterine fibroids, largest measuring 5.5 cm, which appear increased in size and number compared to previous study.  2.8 cm complex right ovarian cyst, with indeterminate but probably benign characteristics. Followup by transvaginal pelvic ultrasound is recommended in 6-12 weeks. This recommendation follows the consensus statement: Management of Asymptomatic Ovarian and Other Adnexal Cysts Imaged at [place]: Society of Radiologists in [place]. Radiology 2010; [phone].   Electronically Signed By: [name] M.D. On: 06/25/2016 17:21  CA125  = 11 on 07/04/16.  Having right sided pain, increased with movement.  No bleeding abnormal bleeding. No significant pain during her menses.  Stopped OCPs in her 53s due to emotional changes.  No hx HTN, migraine HA, liver disease.  States a hx of blood clot in her leg as a child. It was behind her knee. States this was superficial.  GYNECOLOGIC HISTORY: Patient's last menstrual period was 08/02/2016 (exact date). Contraception:  Tubal Menopausal hormone therapy:  none Last mamm[facility]:   [facility] Last pap smear:  03/28/16 WNL neg HR HPV         OB History    Gravida Para Term Preterm AB Living   2 2 2     2    SAB TAB Ectopic Multiple Live Births                     Patient Active Problem List   Diagnosis Date Noted  . Right ovarian cyst 07/05/2016  . Social anxiety disorder     Past Medical History:  Diagnosis Date  . C. difficile colitis   . Chondromalacia   . Dysplasia of cervix, low grade (CIN 1) 1991  . Fibroid   . HSV-1 infection 2010  .  Lumbar spondylosis   . Social anxiety disorder 1998    Past Surgical History:  Procedure Laterality Date  . APPENDECTOMY    . BREAST ENHANCEMENT SURGERY  2008   [facility])  . TUBAL LIGATION      Current Outpatient Prescriptions  Medication Sig Dispense Refill  .  ALPRAZolam (XANAX) 0.5 MG tablet Take 0.5 mg by mouth at bedtime as needed for sleep.    . Amphetamine-Dextroamphetamine (ADDERALL PO) Take 1 tablet by mouth.     . escitalopram (LEXAPRO) 20 MG tablet Take 20 mg by mouth daily.    Marland Kitchen HYDROcodone-acetaminophen (NORCO/VICODIN) 5-325 MG per tablet Take 1 tablet by mouth every 6 (six) hours as needed for moderate pain.    Marland Kitchen imipramine (TOFRANIL) 10 MG tablet Take 1 tablet by mouth at bedtime as needed.  0  . valACYclovir (VALTREX) 500 MG tablet Take 500 mg by mouth 2 (two) times daily.     No current facility-administered medications for this visit.      ALLERGIES: Cephalosporins  Family History  Problem Relation Age of Onset  . Cancer Father     lung  . Cancer Brother     testicular cancer  . Osteoarthritis Mother   . Migraines Mother   . Thyroid disease Mother   . Osteoarthritis Maternal Grandmother   . Hypertension Paternal Grandmother   . Stroke Paternal Grandmother     Social History   Social History  . Marital status: Widowed    Spouse name: N/A  . Number of children: N/A  . Years of education: N/A   Occupational History  . Not on file.   Social History Main Topics  . Smoking status: Never Smoker  . Smokeless tobacco: Never Used  . Alcohol use 1.2 oz/week    2 Standard drinks or equivalent per week     Comment: 1-2 glasses of wine per week  . Drug use: No  . Sexual activity: Yes    Partners: Male    Birth control/ protection: Surgical     Comment: BTL   Other Topics Concern  . Not on file   Social History Narrative  . No narrative on file    ROS:  Pertinent items are noted in HPI.  PHYSICAL EXAMINATION:    BP 100/62 (BP Location: Right Arm, Patient Position: Sitting, Cuff Size: Normal)   Pulse 70   Ht 5\' 4"  (1.626 m)   Wt 161 lb (73 kg)   LMP 08/02/2016 (Exact Date)   BMI 27.64 kg/m     General appearance: alert, cooperative and appears stated age    Pelvic ultrasound today: 5 fibroids - largest  4.9 cm.  EMS 10.34 mm. Right ovary - collapsed 1.5 cm CL cyst.  Left ovary - normal. No free fluid.   ASSESSMENT  Pelvic pain/right back pain. Fibroids.  Normal right ovary. Lumbar spondylosis. Hx of superficial thrombosis of vein in leg as child? Took combined OCPs in her 53s without thromboembolic event but had emotional changes.  PLAN  Discussion of pelvic pain, back pain, fibroids, and ovarian cysts.  We have had discussions about whether or not her fibroids have contributed to her right back pain over the years, and I have not been consisently convinced that this the the etiology of the pain.  Patient expresses desire to avoid surgical care if possible, and it is reasonable to try to treat her pelvic pain with Micronor.  Micronor will not treat the fibroids but may treat pain related to  ovulation or endometriosis.  She is instructed in use and side effects. Follow up in 3 months.    An After Visit Summary was printed and given to the patient.  _15_____ minutes face to face time of which over 50% was spent in counseling.

## 2016-08-16 NOTE — Patient Instructions (Signed)
Norethindrone tablets (contraception) What is this medicine? NORETHINDRONE (nor eth IN drone) is an oral contraceptive. The product contains a female hormone known as a progestin. It is used to prevent pregnancy. This medicine may be used for other purposes; ask your health care provider or pharmacist if you have questions. What should I tell my health care provider before I take this medicine? They need to know if you have any of these conditions: -blood vessel disease or blood clots -breast, cervical, or vaginal cancer -diabetes -heart disease -kidney disease -liver disease -mental depression -migraine -seizures -stroke -vaginal bleeding -an unusual or allergic reaction to norethindrone, other medicines, foods, dyes, or preservatives -pregnant or trying to get pregnant -breast-feeding How should I use this medicine? Take this medicine by mouth with a glass of water. You may take it with or without food. Follow the directions on the prescription label. Take this medicine at the same time each day and in the order directed on the package. Do not take your medicine more often than directed. Contact your pediatrician regarding the use of this medicine in children. Special care may be needed. This medicine has been used in female children who have started having menstrual periods. A patient package insert for the product will be given with each prescription and refill. Read this sheet carefully each time. The sheet may change frequently. Overdosage: If you think you have taken too much of this medicine contact a poison control center or emergency room at once. NOTE: This medicine is only for you. Do not share this medicine with others. What if I miss a dose? Try not to miss a dose. Every time you miss a dose or take a dose late your chance of pregnancy increases. When 1 pill is missed (even if only 3 hours late), take the missed pill as soon as possible and continue taking a pill each day at  the regular time (use a back up method of birth control for the next 48 hours). If more than 1 dose is missed, use an additional birth control method for the rest of your pill pack until menses occurs. Contact your health care professional if more than 1 dose has been missed. What may interact with this medicine? Do not take this medicine with any of the following medications: -amprenavir or fosamprenavir -bosentan This medicine may also interact with the following medications: -antibiotics or medicines for infections, especially rifampin, rifabutin, rifapentine, and griseofulvin, and possibly penicillins or tetracyclines -aprepitant -barbiturate medicines, such as phenobarbital -carbamazepine -felbamate -modafinil -oxcarbazepine -phenytoin -ritonavir or other medicines for HIV infection or AIDS -St. John's wort -topiramate This list may not describe all possible interactions. Give your health care provider a list of all the medicines, herbs, non-prescription drugs, or dietary supplements you use. Also tell them if you smoke, drink alcohol, or use illegal drugs. Some items may interact with your medicine. What should I watch for while using this medicine? Visit your doctor or health care professional for regular checks on your progress. You will need a regular breast and pelvic exam and Pap smear while on this medicine. Use an additional method of birth control during the first cycle that you take these tablets. If you have any reason to think you are pregnant, stop taking this medicine right away and contact your doctor or health care professional. If you are taking this medicine for hormone related problems, it may take several cycles of use to see improvement in your condition. This medicine does not protect you   against HIV infection (AIDS) or any other sexually transmitted diseases. What side effects may I notice from receiving this medicine? Side effects that you should report to your  doctor or health care professional as soon as possible: -breast tenderness or discharge -pain in the abdomen, chest, groin or leg -severe headache -skin rash, itching, or hives -sudden shortness of breath -unusually weak or tired -vision or speech problems -yellowing of skin or eyes Side effects that usually do not require medical attention (report to your doctor or health care professional if they continue or are bothersome): -changes in sexual desire -change in menstrual flow -facial hair growth -fluid retention and swelling -headache -irritability -nausea -weight gain or loss This list may not describe all possible side effects. Call your doctor for medical advice about side effects. You may report side effects to FDA at 1-800-FDA-1088. Where should I keep my medicine? Keep out of the reach of children. Store at room temperature between 15 and 30 degrees C (59 and 86 degrees F). Throw away any unused medicine after the expiration date. NOTE: This sheet is a summary. It may not cover all possible information. If you have questions about this medicine, talk to your doctor, pharmacist, or health care provider.    2016, Elsevier/Gold Standard. (2012-06-13 16:41:35)  

## 2016-08-16 NOTE — Progress Notes (Signed)
Encounter reviewed by Dr. Brook Amundson C. Silva.  

## 2016-11-01 ENCOUNTER — Telehealth: Payer: Self-pay | Admitting: Obstetrics and Gynecology

## 2016-11-01 MED ORDER — NORETHINDRONE ACETATE 5 MG PO TABS: 5.0000 mg | ORAL_TABLET | Freq: Two times a day (BID) | ORAL | 0 refills | Status: DC

## 2016-11-01 NOTE — Telephone Encounter (Signed)
Spoke with patient, advised as seen below per Dr. Quincy Simmonds. Order placed for Aygestin 5mg  po bid #[id number] at verified pharmacy on file. Micronor discontinued. Patient verbalizes understanding and is agreeable.   Routing to provider for final review. Patient is agreeable to disposition. Will close encounter.

## 2016-11-01 NOTE — Addendum Note (Signed)
Addended by: Burnice Logan on: 11/01/2016 12:11 PM   Modules accepted: Orders

## 2016-11-01 NOTE — Telephone Encounter (Signed)
Spoke with patient. Patient states she is now on her 3rd pack of Micronor. Patient states started in Nov 2017 to "work on uterine fibroids and pain". Patient states she has been bleeding the whole month of January. Patient reports little more than spotting, not heavy. Patient reports pain has not gone away, worse at time. Patient states pain is mostly in hip, causing a limp and interrupts sleep. Advised patient can experience irregular cycle during first 3 months of taking OCP. Advised patient to continue to monitor cycles and keep f/u appointment with Dr. Quincy Simmonds on 11/19/16. Advised patient will review with Dr. Quincy Simmonds and return call for any additional recommendations. Patient is agreeable.  Dr. Quincy Simmonds -any additional recommendations?  Routing to provider for final review. Patient is agreeable to disposition. Will close encounter.

## 2016-11-01 NOTE — Telephone Encounter (Signed)
Switch to Aygestin 5 mg po bid to try to control bleeding and hopefully pain.  Dispense:  60, RF none.  Has had tubal ligation.  Ibuprofen 800 mg po q 8 hours prn if she can take this.  Keep appointment and call back if pain or bleeding increases.

## 2016-11-01 NOTE — Telephone Encounter (Signed)
Patient is asking to talk with Dr.Silva's nurse regarding her current birth control and irregular cycles.

## 2016-11-19 ENCOUNTER — Ambulatory Visit (INDEPENDENT_AMBULATORY_CARE_PROVIDER_SITE_OTHER): Payer: 59 | Admitting: Obstetrics and Gynecology

## 2016-11-19 ENCOUNTER — Encounter: Payer: Self-pay | Admitting: Obstetrics and Gynecology

## 2016-11-19 VITALS — BP 102/64 | HR 70 | Ht 64.0 in | Wt 166.2 lb

## 2016-11-19 DIAGNOSIS — N946 Dysmenorrhea, unspecified: Secondary | ICD-10-CM

## 2016-11-19 DIAGNOSIS — M25551 Pain in right hip: Secondary | ICD-10-CM | POA: Diagnosis not present

## 2016-11-19 DIAGNOSIS — D259 Leiomyoma of uterus, unspecified: Secondary | ICD-10-CM | POA: Diagnosis not present

## 2016-11-19 NOTE — Patient Instructions (Signed)
We will contact your insurance about Depo Lupron.

## 2016-11-19 NOTE — Progress Notes (Signed)
GYNECOLOGY  VISIT   HPI: 56 y.o.   Widowed  Caucasian  female   G2P2002 with Patient's last menstrual period was 10/08/2016 (approximate).   here for 3 month follow up. Still with right sided pain.  Last ultrasound done on 08/16/16: Has known uterine fibroids.  Largest is 4.9 9 cm. Had collapsed 1.5 cm CL cyst.  Left ovary normal.  No free fluid.   Given a trial of Micronor to treat pelvic pain.  Has breakthrough bleeding with old blood and was switched to Aygestin 5 mg po bid.  Bleeding is now controlled.  Had spotting only once.   Still having right sided pain at night when she is lying down.  Has a constant dull ache in right hip and radiates around the right.  May be a little less frequent.  Pain can wake her up.  Did take 3 Advil for pain and was able to sleep better. Still has some hydrocodone but is not taking it often.  Able to work because she stands a lot. Not able to walk a lot.   Movement does seems to help the pain. She wants to start getting on the treadmill.   Pain is definitely worse with the onset of her cycles. Menses were once a month until January when had the breakthrough bleeding.   Lots of night sweats.  Last steroid injection was in early 2017.   Feels more anxious and negative. Takes antianxiety medication.   Patient is a Emergency planning/management officer.  GYNECOLOGIC HISTORY: Patient's last menstrual period was 10/08/2016 (approximate). Contraception:  Tubal Menopausal hormone therapy:  none Last mamm[facility]:  [facility]/08/[facility] Last pap smear:   03/28/16 WNL neg HR HPV          OB History    Gravida Para Term Preterm AB Living   2 2 2     2    SAB TAB Ectopic Multiple Live Births                     Patient Active Problem List   Diagnosis Date Noted  . Right ovarian cyst 07/05/2016  . Social anxiety disorder     Past Medical History:  Diagnosis Date  . C. difficile colitis   . Chondromalacia   . Dysplasia of cervix, low grade  (CIN 1) 1991  . Fibroid   . HSV-1 infection 2010  . Lumbar spondylosis   . Social anxiety disorder 1998    Past Surgical History:  Procedure Laterality Date  . APPENDECTOMY    . BREAST ENHANCEMENT SURGERY  2008   [facility])  . TUBAL LIGATION      Current Outpatient Prescriptions  Medication Sig Dispense Refill  . ALPRAZolam (XANAX) 0.5 MG tablet Take 0.5 mg by mouth at bedtime as needed for sleep.    . Amphetamine-Dextroamphetamine (ADDERALL PO) Take 1 tablet by mouth.     . escitalopram (LEXAPRO) 20 MG tablet Take 20 mg by mouth daily.    Marland Kitchen HYDROcodone-acetaminophen (NORCO/VICODIN) 5-325 MG per tablet Take 1 tablet by mouth every 6 (six) hours as needed for moderate pain.    Marland Kitchen imipramine (TOFRANIL) 10 MG tablet Take 1 tablet by mouth at bedtime as needed.  0  . norethindrone (AYGESTIN) 5 MG tablet Take 1 tablet (5 mg total) by mouth 2 (two) times daily. 60 tablet 0  . valACYclovir (VALTREX) 500 MG tablet Take 500 mg by mouth 2 (two) times daily.     No current  facility-administered medications for this visit.      ALLERGIES: Cephalosporins  Family History  Problem Relation Age of Onset  . Cancer Father     lung  . Cancer Brother     testicular cancer  . Osteoarthritis Mother   . Migraines Mother   . Thyroid disease Mother   . Osteoarthritis Maternal Grandmother   . Hypertension Paternal Grandmother   . Stroke Paternal Grandmother     Social History   Social History  . Marital status: Widowed    Spouse name: N/A  . Number of children: N/A  . Years of education: N/A   Occupational History  . Not on file.   Social History Main Topics  . Smoking status: Never Smoker  . Smokeless tobacco: Never Used  . Alcohol use 1.2 oz/week    2 Standard drinks or equivalent per week     Comment: 1-2 glasses of wine per week  . Drug use: No  . Sexual activity: Yes    Partners: Male    Birth control/ protection: Surgical     Comment: BTL   Other Topics Concern  .  Not on file   Social History Narrative  . No narrative on file    ROS:  Pertinent items are noted in HPI.  PHYSICAL EXAMINATION:    BP 102/64 (BP Location: Right Arm, Patient Position: Sitting, Cuff Size: Normal)   Pulse 70   Ht 5\' 4"  (1.626 m)   Wt 166 lb 3.2 oz (75.4 kg)   LMP 10/08/2016 (Approximate)   BMI 28.53 kg/m     General appearance: alert, cooperative and appears stated age   Pelvic: External genitalia:  no lesions              Urethra:  normal appearing urethra with no masses, tenderness or lesions              Bartholins and Skenes: normal                 Vagina: normal appearing vagina with normal color and discharge, no lesions              Cervix: no lesions                Bimanual Exam:  Uterus:  10 week size and retroverted, position, consistency, mobility, non-tender              Adnexa: no mass, fullness, tenderness              Rectal exam: Yes.  .  Confirms.  I can feel fibroid on the posterior uterine wall and extending to the right side.               Anus:  normal sphincter tone, no lesions  Chaperone was present for exam.  ASSESSMENT  Pelvic pain/right back pain.  Failed course of Micronor. Currently on Aygestin. Fibroids.  Dysmenorrhea. Lumbar spondylosis. Hx of superficial thrombosis of vein in leg as child? Took combined OCPs in her 69s without thromboembolic event but had emotional changes.   PLAN  Discussed possible etiologies of pelvic pain - lumbar spondylosis, fibroids, endometriosis, fibrosis.  Discussed Depo Lupron and laparoscopic hysterectomy with bilateral salpingectomy, possible bilateral oophorectomy.  Will do Depo Lupron for 6 months.  I discussed side effects of vasomotor symptoms, possible emotional changes.  We can do add back Aygestin if needed.  Will come in for a recheck in 3 months after starting medication.    An  After Visit Summary was printed and given to the patient.  ____25______ minutes face to face time of  which over 50% was spent in counseling.

## 2016-11-22 ENCOUNTER — Telehealth: Payer: Self-pay | Admitting: Obstetrics and Gynecology

## 2016-11-22 NOTE — Telephone Encounter (Signed)
Arbie Cookey from [facility] is calling to see if this patient has been given a summary of benefits and the pre determination form.

## 2016-11-22 NOTE — Telephone Encounter (Signed)
Return call to [facility]. Spoke to [place]. Confirming summary of benefits was received. Advised fax copy was received via fax and pre-determination has been faxed to [facility].

## 2016-11-23 NOTE — Telephone Encounter (Signed)
Call to [facility] by Karmen Bongo RN. Confirmed predetermination form for Lupron and medical records has been received and is in process with nurse reviewer.   Routing to provider for final review. Will close encounter.

## 2016-11-26 ENCOUNTER — Ambulatory Visit
Admission: RE | Admit: 2016-11-26 | Discharge: 2016-11-26 | Disposition: A | Discharge status: Home / Self Care | Payer: 59 | Source: Ambulatory Visit | Attending: Family Medicine | Admitting: Family Medicine

## 2016-11-26 ENCOUNTER — Other Ambulatory Visit: Payer: Self-pay | Admitting: Family Medicine

## 2016-11-26 DIAGNOSIS — M25551 Pain in right hip: Secondary | ICD-10-CM

## 2016-11-27 ENCOUNTER — Other Ambulatory Visit: Payer: Self-pay | Admitting: Family Medicine

## 2016-11-27 DIAGNOSIS — M25551 Pain in right hip: Secondary | ICD-10-CM

## 2016-12-05 ENCOUNTER — Telehealth: Payer: Self-pay

## 2016-12-05 MED ORDER — NORETHINDRONE ACETATE 5 MG PO TABS: 5.0000 mg | ORAL_TABLET | Freq: Two times a day (BID) | ORAL | 0 refills | Status: DC

## 2016-12-05 NOTE — Telephone Encounter (Signed)
Sorry for the oversight.  I was focusing on the Depo Lupron! I would have the patient continue on the Aygestin at least 2 additional weeks after taking the Depo Lupron injection.  The Depo Lupron is designed to stop all bleeding and create temporary menopause. That being said, patients frequently have one final period shortly after taking the first injection.  If she has terrible hot flashes with the Depo Lupron, we have the option of doing low dose daily Aygestin along with the Depo Lupron injection therapy.  If she needs refills of the Aygestin, give her enough for one additional month, as I don't know when she is coming in for the injection.  I hope this answers her questions.

## 2016-12-05 NOTE — Telephone Encounter (Signed)
Depo Lupron treatment is usually 6 months.

## 2016-12-05 NOTE — Telephone Encounter (Signed)
Spoke with patient. Advised of message as seen below from [place]. Patient verbalizes understanding. Rx for Aygestin 5 mg BID #[id number] [phone] sent to pharmacy on file.   Routing to provider for final review. Patient agreeable to disposition. Will close encounter.

## 2016-12-05 NOTE — Telephone Encounter (Signed)
Dr.Silva, please review oral Aygestin 5 mg BID recommendations.

## 2016-12-05 NOTE — Telephone Encounter (Signed)
Spoke with patient. Advised Lupron [place] has been delivered to office. Appointment for first injection scheduled for 12/11/2016 at 9 am. Patient is agreeable to date and time. Patient states she has been taking Aygestin 5 mg two times daily and only has a few days left in her current rx. Patient is asking if she will need a refill and how long she will need to continue taking this medication. Advised I will review this with Dr.Silva and return call with further recommendations. Patient is agreeable.

## 2016-12-11 ENCOUNTER — Ambulatory Visit (INDEPENDENT_AMBULATORY_CARE_PROVIDER_SITE_OTHER): Payer: 59

## 2016-12-11 VITALS — BP 118/74 | HR 80 | Resp 20 | Ht 64.0 in | Wt 173.0 lb

## 2016-12-11 DIAGNOSIS — D259 Leiomyoma of uterus, unspecified: Secondary | ICD-10-CM

## 2016-12-11 MED ORDER — LEUPROLIDE ACETATE (3 MONTH) 11.25 MG IM KIT
11.2500 mg | PACK | Freq: Once | INTRAMUSCULAR | Status: AC
  Administered 2016-12-11: 11.25 mg via INTRAMUSCULAR

## 2016-12-11 NOTE — Progress Notes (Signed)
56 y.o. Married Caucasian female here for her first Depo Lupron injection.  Indication for injection:  Uterine Leiomyomas  LMP:  10/08/16  Contraception:  Postpartum Sterilization  Lupron order:  11.25mg  IM x 3 month dosages.  Injection given in [facility].   Order to administer given by Dr. Quincy Simmonds on 11/19/16.

## 2017-01-08 ENCOUNTER — Ambulatory Visit
Admission: RE | Admit: 2017-01-08 | Discharge: 2017-01-08 | Disposition: A | Discharge status: Home / Self Care | Payer: 59 | Source: Ambulatory Visit | Attending: Family Medicine | Admitting: Family Medicine

## 2017-01-08 DIAGNOSIS — M25551 Pain in right hip: Secondary | ICD-10-CM

## 2017-01-08 MED ORDER — IOPAMIDOL (ISOVUE-M 200) INJECTION 41%
1.0000 mL | Freq: Once | INTRAMUSCULAR | Status: AC
  Administered 2017-01-08: 1 mL via INTRA_ARTICULAR

## 2017-01-08 MED ORDER — METHYLPREDNISOLONE ACETATE 40 MG/ML INJ SUSP (RADIOLOG
120.0000 mg | Freq: Once | INTRAMUSCULAR | Status: AC
  Administered 2017-01-08: 120 mg via INTRA_ARTICULAR

## 2017-02-06 ENCOUNTER — Other Ambulatory Visit (HOSPITAL_BASED_OUTPATIENT_CLINIC_OR_DEPARTMENT_OTHER): Payer: Self-pay

## 2017-02-06 DIAGNOSIS — R5383 Other fatigue: Secondary | ICD-10-CM

## 2017-03-05 ENCOUNTER — Telehealth: Payer: Self-pay

## 2017-03-05 NOTE — Telephone Encounter (Signed)
Spoke with Beverlee Nims at [facility] who is calling to schedule delivery of Lupron. Lupron scheduled for delivery on 03/07/2017. Patient's last injection was given on 12/11/2016 and next is due on 03/11/2017. Per OV from Dr.Silva dated 11/19/2016 the patient is to have a follow up 3 months after her first injection. Follow up appointment scheduled for 03/18/2017 at 2 pm with Dr.Silva. Patient declines all earlier appointments. Aware based on recheck she will be due for next injection at that appointment. Patient verbalizes understanding.  Routing to Dr.Silva for review.

## 2017-03-06 NOTE — Telephone Encounter (Signed)
OK to the appointment on 03/18/17.  OK to close encounter.

## 2017-03-18 ENCOUNTER — Ambulatory Visit (INDEPENDENT_AMBULATORY_CARE_PROVIDER_SITE_OTHER): Payer: 59 | Admitting: Obstetrics and Gynecology

## 2017-03-18 ENCOUNTER — Encounter: Payer: Self-pay | Admitting: Obstetrics and Gynecology

## 2017-03-18 VITALS — BP 100/60 | HR 68 | Resp 16 | Ht 64.0 in | Wt 167.2 lb

## 2017-03-18 DIAGNOSIS — R3915 Urgency of urination: Secondary | ICD-10-CM | POA: Diagnosis not present

## 2017-03-18 DIAGNOSIS — B379 Candidiasis, unspecified: Secondary | ICD-10-CM

## 2017-03-18 DIAGNOSIS — D259 Leiomyoma of uterus, unspecified: Secondary | ICD-10-CM | POA: Diagnosis not present

## 2017-03-18 LAB — POCT URINALYSIS DIPSTICK
BILIRUBIN UA: NEGATIVE
Glucose, UA: NEGATIVE
KETONES UA: NEGATIVE
NITRITE UA: NEGATIVE
PH UA: 6 (ref 5.0–8.0)
Protein, UA: NEGATIVE
Urobilinogen, UA: 0.2 E.U./dL

## 2017-03-18 LAB — POCT URINE PREGNANCY: Preg Test, Ur: NEGATIVE

## 2017-03-18 MED ORDER — LEUPROLIDE ACETATE 3.75 MG IM KIT: 3.7500 mg | PACK | Freq: Once | INTRAMUSCULAR | Status: DC

## 2017-03-18 MED ORDER — LEUPROLIDE ACETATE (3 MONTH) 11.25 MG IM KIT
11.2500 mg | PACK | Freq: Once | INTRAMUSCULAR | Status: AC
  Administered 2017-03-18: 11.25 mg via INTRAMUSCULAR

## 2017-03-18 NOTE — Progress Notes (Signed)
GYNECOLOGY  VISIT   HPI: 56 y.o.   Married  Caucasian  female   G2P2002 with Patient's last menstrual period was 02/11/2017.   here for a 3 month recheck after Lupron.  Had nausea and diarrhea following the injection.  Lasted for 8 hours.  Was due for her second Depo Lupron on 03/11/17.   Hs known fibroids and chronic right sided pelvic pain. Feels like she is starting to have pain on the right side again.  Felt like it completely went away after she received the Depo Lupron. This is a big change for the patient!  Had a regular period in April and May.  No menses in June.  Sometimes she has hot flashes, which are bearable.  Vaginal dryness.   She failed a course of Micronor and Aygestin previously.  One week ago had burning with urination.  She took AZO and felt better.  Her PCP prescribed Diflucan and she improved this.  Symptoms are now gone.  Denies dysuria.  Just having urinary urgency.   GYNECOLOGIC HISTORY: Patient's last menstrual period was 02/11/2017. Contraception:  Tubal Menopausal hormone therapy:  none Last mammogram:  05/08/16 [facility] Last pap smear:   03/28/16 WNL neg HR HPV          OB History    Gravida Para Term Preterm AB Living   2 2 2     2    SAB TAB Ectopic Multiple Live Births                     Patient Active Problem List   Diagnosis Date Noted  . Right ovarian cyst 07/05/2016  . Social anxiety disorder     Past Medical History:  Diagnosis Date  . C. difficile colitis   . Chondromalacia   . Dysplasia of cervix, low grade (CIN 1) 1991  . Fibroid   . HSV-1 infection 2010  . Lumbar spondylosis   . Social anxiety disorder 1998    Past Surgical History:  Procedure Laterality Date  . APPENDECTOMY    . BREAST ENHANCEMENT SURGERY  2008   [facility] [place])  . TUBAL LIGATION      Current Outpatient Prescriptions  Medication Sig Dispense Refill  . escitalopram (LEXAPRO) 20 MG tablet Take 20 mg by mouth daily.     . fluconazole (DIFLUCAN) 150 MG tablet     . valACYclovir (VALTREX) 500 MG tablet Take 500 mg by mouth 2 (two) times daily.     No current facility-administered medications for this visit.      ALLERGIES: Cephalosporins  Family History  Problem Relation Age of Onset  . Cancer Father        lung  . Cancer Brother        testicular cancer  . Osteoarthritis Mother   . Migraines Mother   . Thyroid disease Mother   . Osteoarthritis Maternal Grandmother   . Hypertension Paternal Grandmother   . Stroke Paternal Grandmother     Social History   Social History  . Marital status: Married    Spouse name: N/A  . Number of children: N/A  . Years of education: N/A   Occupational History  . Not on file.   Social History Main Topics  . Smoking status: Never Smoker  . Smokeless tobacco: Never Used  . Alcohol use 1.2 oz/week    2 Standard drinks or equivalent per week     Comment: 1-2 glasses of wine per week  .  Drug use: No  . Sexual activity: Yes    Partners: Male    Birth control/ protection: Surgical     Comment: BTL   Other Topics Concern  . Not on file   Social History Narrative  . No narrative on file    ROS:  Pertinent items are noted in HPI.  PHYSICAL EXAMINATION:    BP 100/60 (BP Location: Right Arm, Patient Position: Sitting, Cuff Size: Normal)   Pulse 68   Resp 16   Ht 5\' 4"  (1.626 m)   Wt 167 lb 3.2 oz (75.8 kg)   LMP 02/11/2017   BMI 28.70 kg/m     General appearance: alert, cooperative and appears stated age   Pelvic: External genitalia:  no lesions              Urethra:  normal appearing urethra with no masses, tenderness or lesions              Bartholins and Skenes: normal                 Vagina: normal appearing vagina with normal color and discharge, no lesions              Cervix: no lesions                Bimanual Exam:  Uterus:  normal size, contour, position, consistency, mobility, non-tender, retroverted.               Adnexa: no mass,  fullness, tenderness             Chaperone was present for exam.  ASSESSMENT  Chronic right lower quadrant pain.  Fibroids.  Perimenopausal female.  Status post Depo Lupron x 1.  Urinary urgency.  Recent yeast vaginitis.   PLAN  Affirm, urine micro and culture.  UPT.  If negative, give second Depo Lupron 11.25 mg.  We discussed the benefits of Lupron to treat fibroids and endometriosis.  We hope to transition her to menopause without having hysterectomy or laparoscopic surgery. Follow up in 3 months.    An After Visit Summary was printed and given to the patient.  ___25___ minutes face to face time of which over 50% was spent in counseling.

## 2017-03-19 LAB — URINALYSIS, MICROSCOPIC ONLY
CASTS: NONE SEEN /LPF
WBC, UA: >30 /hpf — AB (ref 0–?)

## 2017-03-19 LAB — URINE CULTURE

## 2017-03-20 NOTE — Progress Notes (Signed)
Lupron injection given in RUOQ. Patient tolerated well.

## 2017-03-22 ENCOUNTER — Other Ambulatory Visit: Payer: Self-pay

## 2017-03-22 ENCOUNTER — Other Ambulatory Visit: Payer: Self-pay | Admitting: *Deleted

## 2017-03-22 DIAGNOSIS — N760 Acute vaginitis: Secondary | ICD-10-CM

## 2017-03-22 LAB — VAGINITIS/VAGINOSIS, DNA PROBE

## 2017-03-24 LAB — NUSWAB BV AND CANDIDA, NAA
CANDIDA ALBICANS, NAA: NEGATIVE
CANDIDA GLABRATA, NAA: NEGATIVE

## 2017-03-24 LAB — SPECIMEN STATUS REPORT

## 2017-03-29 ENCOUNTER — Ambulatory Visit: Payer: BLUE CROSS/BLUE SHIELD | Admitting: Obstetrics and Gynecology

## 2017-04-02 ENCOUNTER — Ambulatory Visit (HOSPITAL_BASED_OUTPATIENT_CLINIC_OR_DEPARTMENT_OTHER): Payer: 59 | Attending: Family Medicine | Admitting: Internal Medicine

## 2017-04-02 DIAGNOSIS — G4733 Obstructive sleep apnea (adult) (pediatric): Secondary | ICD-10-CM | POA: Insufficient documentation

## 2017-04-02 DIAGNOSIS — R5383 Other fatigue: Secondary | ICD-10-CM | POA: Insufficient documentation

## 2017-04-02 DIAGNOSIS — R0683 Snoring: Secondary | ICD-10-CM | POA: Insufficient documentation

## 2017-04-02 DIAGNOSIS — G4730 Sleep apnea, unspecified: Secondary | ICD-10-CM

## 2017-04-02 HISTORY — DX: Sleep apnea, unspecified: G47.30

## 2017-04-09 ENCOUNTER — Other Ambulatory Visit (HOSPITAL_BASED_OUTPATIENT_CLINIC_OR_DEPARTMENT_OTHER): Payer: Self-pay

## 2017-04-09 DIAGNOSIS — R5383 Other fatigue: Secondary | ICD-10-CM

## 2017-04-10 DIAGNOSIS — G4733 Obstructive sleep apnea (adult) (pediatric): Secondary | ICD-10-CM | POA: Diagnosis not present

## 2017-04-10 NOTE — Procedures (Signed)
   Patient Name: Lisa Wade, Lisa Wade Date: 04/02/2017 Gender: Female D.O.B: 19-May-1961 Age (years): 56 Referring Provider: Derinda Late Height (inches): 13 Interpreting Physician: Baird Lyons MD, ABSM Weight (lbs): 165 RPSGT: Jacolyn Reedy BMI: 28 MRN: [id number] Neck Size: 14.00 CLINICAL INFORMATION Sleep Study Type: unattended HST  Indication for sleep study: Fatigue  Epworth Sleepiness Score: 16  SLEEP STUDY TECHNIQUE A multi-channel overnight portable sleep study was performed. The channels recorded were: nasal airflow, thoracic respiratory movement, and oxygen saturation with a pulse oximetry. Snoring was also monitored.  MEDICATIONS Patient self administered medications include: none reported during study.  SLEEP ARCHITECTURE Patient was studied for 495.6 minutes. The sleep efficiency was 99.2 % and the patient was supine for 93.5%. The arousal index was 0.0 per hour.  RESPIRATORY PARAMETERS The overall AHI was 20.1 per hour, with a central apnea index of 0.0 per hour.  The oxygen nadir was 88% during sleep.  CARDIAC DATA Mean heart rate during sleep was 67.0 bpm.  IMPRESSIONS - Moderate obstructive sleep apnea occurred during this study (AHI = 20.1/h). - No significant central sleep apnea occurred during this study (CAI = 0.0/h). - Mild oxygen desaturation was noted during this study (Min O2 = 88%). - Patient snored   DIAGNOSIS - Obstructive Sleep Apnea (327.23 [G47.33 ICD-10])  RECOMMENDATIONS  - CPAP titration to establish therapy. Other options based on clinical judgment. - Positional therapy avoiding supine position during sleep. - Be careful with alcohol, sedatives and other CNS depressants that may worsen sleep apnea and disrupt normal sleep architecture. - Sleep hygiene should be reviewed to assess factors that may improve sleep quality. - Weight management and regular exercise should be initiated or continued.  [Electronically signed]  04/10/2017 09:56 AM  Baird Lyons MD, ABSM Diplomate, American Board of Sleep Medicine   NPI: [id number]  [place], American Board of Sleep Medicine  ELECTRONICALLY SIGNED ON:  04/10/2017, 9:57 AM [place] PH: [phone]   FX: [phone] [place]

## 2017-05-24 NOTE — Progress Notes (Addendum)
56 y.o. G7P2002 Married Caucasian female here for annual exam.    Has fibroids and chronic right sided pelvic pain.  Feels like she is better overall with her pain.  Still some right sided pain if she lays on her right side. Occasional hot flashes.  Status post Depo Lupron x 2.  Failed Micronor and Aygestin.  Does not like emotional changes with estrogen containing OCPs.  Reports swelling and more left ankle swelling.  Did fall recently.   Sold her house where she raised her children.  Living with our boyfriend.   PCP:   Dr. Derinda Late   Patient's last menstrual period was 02/11/2017 (within days).           Sexually active: Yes.    The current method of family planning is tubal ligation.    Exercising: No.  The patient does not participate in regular exercise at present. Smoker:  no  Health Maintenance: Pap: 03-28-16 Neg:Neg HR HPV;02-04-14 Neg:Neg HR HPV History of abnormal Pap:  Yes, Hx of CIN I 1991 MMG: 05-07-16 [facility]/Neg/[facility]ads[facility]:TBC Colonoscopy: 2015 normal with Dr. Brita Romp due 2025 BMD:   n/a  Result  n/a TDaP:  10/08/2010 Gardasil:   no HIV: 03-28-16 Neg Hep C: 03-28-16 Neg Screening Labs: PCP takes care of labs   reports that she has never smoked. She has never used smokeless tobacco. She reports that she drinks about 1.2 oz of alcohol per week . She reports that she does not use drugs.  Past Medical History:  Diagnosis Date  . C. difficile colitis   . Chondromalacia   . Dysplasia of cervix, low grade (CIN 1) 1991  . Fibroid   . HSV-1 infection 2010  . Lumbar spondylosis   . Social anxiety disorder 1998    Past Surgical History:  Procedure Laterality Date  . APPENDECTOMY    . BREAST ENHANCEMENT SURGERY  2008   [facility] [place])  . TUBAL LIGATION      Current Outpatient Prescriptions  Medication Sig Dispense Refill  . escitalopram (LEXAPRO) 20 MG tablet Take 20 mg by mouth daily.    . valACYclovir (VALTREX) 500 MG tablet Take 500 mg by mouth 2  (two) times daily.     No current facility-administered medications for this visit.     Family History  Problem Relation Age of Onset  . Cancer Father        lung  . Cancer Brother        testicular cancer  . Osteoarthritis Mother   . Migraines Mother   . Thyroid disease Mother   . Osteoarthritis Maternal Grandmother   . Hypertension Paternal Grandmother   . Stroke Paternal Grandmother     ROS:  Pertinent items are noted in HPI.  Otherwise, a comprehensive ROS was negative.  Exam:   BP 116/62 (BP Location: Right Arm, Patient Position: Sitting, Cuff Size: Normal)   Pulse 80   Resp 16   Ht 5' 3.25" (1.607 m)   Wt 168 lb (76.2 kg)   LMP 02/11/2017 (Within Days)   BMI 29.53 kg/m     General appearance: alert, cooperative and appears stated age Head: Normocephalic, without obvious abnormality, atraumatic Neck: no adenopathy, supple, symmetrical, trachea midline and thyroid normal to inspection and palpation Lungs: clear to auscultation bilaterally Breasts: Bilateral implants, normal appearance, no masses or tenderness, No nipple retraction or dimpling, No nipple discharge or bleeding, No axillary or supraclavicular adenopathy Heart: regular rate and rhythm Abdomen: soft, non-tender; no masses, no organomegaly  Extremities: extremities normal, atraumatic, no cyanosis or edema Skin: Skin color, texture, turgor normal. No rashes or lesions Lymph nodes: Cervical, supraclavicular, and axillary nodes normal. No abnormal inguinal nodes palpated Neurologic: Grossly normal  Pelvic: External genitalia:  no lesions              Urethra:  normal appearing urethra with no masses, tenderness or lesions              Bartholins and Skenes: normal                 Vagina: normal appearing vagina with normal color and discharge, no lesions              Cervix: no lesions              Pap taken: No. Bimanual Exam:  Uterus:  normal size, contour, position, consistency, mobility, non-tender               Adnexa: no mass, fullness, tenderness              Rectal exam: Yes.  .  Confirms.              Anus:  normal sphincter tone, no lesions  Chaperone was present for exam.  Assessment:   Well woman visit with normal exam. Hx pelvic pain and fibroids.  Status post Depo Lupron x 2.  Second injection still with suppression of menses. Bilateral breast implants.  Plan: Mammogram due.  She will schedule. Recommended self breast awareness. Pap and HR HPV as above. Guidelines for Calcium, Vitamin D, regular exercise program including cardiovascular and weight bearing exercise. Will monitor for recurrent menses and pain.  She will call with either.  Follow up annually and prn.    After visit summary provided.

## 2017-05-27 ENCOUNTER — Ambulatory Visit (INDEPENDENT_AMBULATORY_CARE_PROVIDER_SITE_OTHER): Payer: 59 | Admitting: Obstetrics and Gynecology

## 2017-05-27 ENCOUNTER — Encounter: Payer: Self-pay | Admitting: Obstetrics and Gynecology

## 2017-05-27 VITALS — BP 116/62 | HR 80 | Resp 16 | Ht 63.25 in | Wt 168.0 lb

## 2017-05-27 DIAGNOSIS — Z01419 Encounter for gynecological examination (general) (routine) without abnormal findings: Secondary | ICD-10-CM | POA: Diagnosis not present

## 2017-05-27 NOTE — Patient Instructions (Signed)

## 2017-06-17 ENCOUNTER — Ambulatory Visit: Payer: 59 | Admitting: Obstetrics and Gynecology

## 2017-06-17 ENCOUNTER — Other Ambulatory Visit: Payer: Self-pay | Admitting: Obstetrics and Gynecology

## 2017-06-17 DIAGNOSIS — Z1231 Encounter for screening mammogram for malignant neoplasm of breast: Secondary | ICD-10-CM

## 2017-06-19 NOTE — Progress Notes (Signed)
GYNECOLOGY  VISIT   HPI: 56 y.o.   Married  Caucasian  female   G2P2002 with Patient's last menstrual period was 02/11/2017 (within days).   here for follow up on Lupron.  Had Depo Lupron x 2 for pelvic pain and fibroids.  Second injection was March 18, 2017.  Failed prior Micronor and Aygestin.  Emotional changes with OCPs.  No menses.  Some pain like her menses would start but no bleeding since May.  No night sweats.  Feels warm at night and occasionally.   Feels like she is having more right sided and right hip pain.  Not stabbing pain now.  Her pain was "very present" prior to the Lupron, but is just achy now.   Having increased pain in her body overall. Had negative blood work.  Dr. Sandi Mariscal prescribing Celebrex now.  Now taking hydrocodone any more.   GYNECOLOGIC HISTORY: Patient's last menstrual period was 02/11/2017 (within days). Contraception:  Tubal Menopausal hormone therapy:  none Last mammogram:[facility]/08/16 [facility]  Last pap smear:  03/28/16 Neg:Neg HR HPV, 02-04-14 Neg:Neg HR HPV        OB History    Gravida Para Term Preterm AB Living   2 2 2     2    SAB TAB Ectopic Multiple Live Births                     Patient Active Problem List   Diagnosis Date Noted  . Right ovarian cyst 07/05/2016  . Social anxiety disorder     Past Medical History:  Diagnosis Date  . C. difficile colitis   . Chondromalacia   . Dysplasia of cervix, low grade (CIN 1) 1991  . Fibroid   . HSV-1 infection 2010  . Lumbar spondylosis   . Social anxiety disorder 1998    Past Surgical History:  Procedure Laterality Date  . APPENDECTOMY    . BREAST ENHANCEMENT SURGERY  2008   [facility])  . TUBAL LIGATION      Current Outpatient Prescriptions  Medication Sig Dispense Refill  . celecoxib (CELEBREX) 200 MG capsule Take 1 capsule by mouth 2 (two) times daily.    Marland Kitchen escitalopram (LEXAPRO) 20 MG tablet Take 20 mg by mouth daily.    . valACYclovir  (VALTREX) 500 MG tablet Take 500 mg by mouth 2 (two) times daily.     No current facility-administered medications for this visit.      ALLERGIES: Cephalosporins  Family History  Problem Relation Age of Onset  . Cancer Father        lung  . Cancer Brother        testicular cancer  . Osteoarthritis Mother   . Migraines Mother   . Thyroid disease Mother   . Osteoarthritis Maternal Grandmother   . Hypertension Paternal Grandmother   . Stroke Paternal Grandmother     Social History   Social History  . Marital status: Married    Spouse name: N/A  . Number of children: N/A  . Years of education: N/A   Occupational History  . Not on file.   Social History Main Topics  . Smoking status: Never Smoker  . Smokeless tobacco: Never Used  . Alcohol use 1.2 oz/week    2 Standard drinks or equivalent per week     Comment: 1-2 glasses of wine per week  . Drug use: No  . Sexual activity: Yes    Partners: Male    Birth  control/ protection: Surgical     Comment: BTL   Other Topics Concern  . Not on file   Social History Narrative  . No narrative on file    ROS:  Pertinent items are noted in HPI.  PHYSICAL EXAMINATION:    BP 110/62 (BP Location: Right Arm, Patient Position: Sitting, Cuff Size: Normal)   Pulse 84   Resp 16   Wt 168 lb (76.2 kg)   LMP 02/11/2017 (Within Days)   BMI 29.53 kg/m     General appearance: alert, cooperative and appears stated age  Chaperone was present for exam.  ASSESSMENT  Status post Depo Lupron for pelvic pain and uterine fibroids.  Arthritic type pain may be effect of the Depo Lupron.   PLAN  Will monitor for recurrent pain.  I discussed option for surgical care or Freida Busman if needed.   Call for any future vaginal bleeding.  I would then check an [place] and E2 level. I am hopeful that her arthritic pain will improve off the Depo Lupron.   An After Visit Summary was printed and given to the patient.  ___15___ minutes face to face  time of which over 50% was spent in counseling.

## 2017-06-24 ENCOUNTER — Encounter: Payer: Self-pay | Admitting: Obstetrics and Gynecology

## 2017-06-24 ENCOUNTER — Ambulatory Visit (INDEPENDENT_AMBULATORY_CARE_PROVIDER_SITE_OTHER): Payer: 59 | Admitting: Obstetrics and Gynecology

## 2017-06-24 VITALS — BP 110/62 | HR 84 | Resp 16 | Wt 168.0 lb

## 2017-06-24 DIAGNOSIS — R102 Pelvic and perineal pain: Secondary | ICD-10-CM | POA: Diagnosis not present

## 2017-06-24 DIAGNOSIS — D259 Leiomyoma of uterus, unspecified: Secondary | ICD-10-CM | POA: Diagnosis not present

## 2017-06-24 NOTE — Patient Instructions (Signed)
Please call for any future vaginal bleeding.

## 2017-07-01 ENCOUNTER — Ambulatory Visit
Admission: RE | Admit: 2017-07-01 | Discharge: 2017-07-01 | Disposition: A | Discharge status: Home / Self Care | Payer: 59 | Source: Ambulatory Visit | Attending: Obstetrics and Gynecology | Admitting: Obstetrics and Gynecology

## 2017-07-01 DIAGNOSIS — Z1231 Encounter for screening mammogram for malignant neoplasm of breast: Secondary | ICD-10-CM

## 2017-07-09 ENCOUNTER — Other Ambulatory Visit: Payer: Self-pay | Admitting: Family Medicine

## 2017-07-09 DIAGNOSIS — M7061 Trochanteric bursitis, right hip: Secondary | ICD-10-CM

## 2017-07-23 ENCOUNTER — Ambulatory Visit
Admission: RE | Admit: 2017-07-23 | Discharge: 2017-07-23 | Disposition: A | Discharge status: Home / Self Care | Payer: 59 | Source: Ambulatory Visit | Attending: Family Medicine | Admitting: Family Medicine

## 2017-07-23 DIAGNOSIS — M7061 Trochanteric bursitis, right hip: Secondary | ICD-10-CM

## 2017-07-23 MED ORDER — METHYLPREDNISOLONE ACETATE 40 MG/ML INJ SUSP (RADIOLOG
120.0000 mg | Freq: Once | INTRAMUSCULAR | Status: AC
  Administered 2017-07-23: 120 mg via INTRA_ARTICULAR

## 2017-07-23 MED ORDER — IOPAMIDOL (ISOVUE-M 200) INJECTION 41%
1.0000 mL | Freq: Once | INTRAMUSCULAR | Status: AC
  Administered 2017-07-23: 1 mL via INTRA_ARTICULAR

## 2017-08-22 ENCOUNTER — Telehealth: Payer: Self-pay | Admitting: Obstetrics and Gynecology

## 2017-08-22 DIAGNOSIS — N926 Irregular menstruation, unspecified: Secondary | ICD-10-CM

## 2017-08-22 NOTE — Telephone Encounter (Signed)
Spoke with patient. Patient states menses started this morning, reports flow as light. Last cycle was May 2018. Patient states she was to call with any bleeding, labs would be done.   Patient denies pain, states feels like menses is starting, but nothing like before, "feels great".   Denies any other GYN symptoms.   Reviewed OV notes dated 06/24/17, call with any furtuire bleeding, will check FSH and E2 level. Patient scheduled for lab visit on 08/26/17 at 9:15am.   Advised patient will review with Dr. Quincy Simmonds and return call with any additional recommendations. Patient is agreeable.  Dr. Quincy Simmonds -ok to proceed with labs as scheduled?

## 2017-08-22 NOTE — Telephone Encounter (Signed)
Patient called to report she is having vaginal bleeding again. She said she was told to call if this happened again.  Last seen: 06/24/17

## 2017-08-22 NOTE — Telephone Encounter (Signed)
Ok to proceed with the lab testing to see if this is just resumption of her menses since stopping the Depo Lupron.  I will contact her back when results are available.

## 2017-08-22 NOTE — Telephone Encounter (Signed)
Spoke with patient, advised as seen below per Dr. Quincy Simmonds. Patient agreeable to plan.   Future lab orders placed.   Will close encounter.

## 2017-08-26 ENCOUNTER — Other Ambulatory Visit (INDEPENDENT_AMBULATORY_CARE_PROVIDER_SITE_OTHER): Payer: 59

## 2017-08-26 DIAGNOSIS — N926 Irregular menstruation, unspecified: Secondary | ICD-10-CM

## 2017-08-27 LAB — FOLLICLE STIMULATING HORMONE: FSH: 17 m[IU]/mL

## 2017-08-27 LAB — ESTRADIOL

## 2018-06-04 NOTE — Progress Notes (Signed)
57 y.o. G31P2002 Married Caucasian female here for annual exam. Patient complains of having vulvar itching and some vaginal discharge. Clotrimazole helped but she notices it in the evening.   Had Depo Lupron x 2 for pelvic pain and fibroids.  Second injection was March 18, 2017.  Failed prior Micronor and Aygestin.  Emotional changes with OCPs.  [place] and E2 <5 on 08/26/17.  States her pain on her right side is almost gone.  Had 2 injections for bursitis in her hip and thinks the Lupron helped her right sided pain up higher.   Now having regular menses.   Leaks urine with sneeze.  Stands after voiding and leaks.  DF - hours and hours.  NF - none. Wears a pad. Drinks coffee. FH of incontinence.   Bought a new house and remodeling.   PCP: Dr. Derinda Late     Patient's last menstrual period was 05/03/2018 (within days).           Sexually active: Yes.    The current method of family planning is tubal ligation.    Exercising: Yes.    strengthening training Smoker:  no  Health Maintenance: Pap:  03-28-16 Neg:Neg HR HPV History of abnormal Pap:  Yes, Hx of CIN 1 1991 MMG:  07/01/17 BIRADS 1 negative/density c Colonoscopy:  2015 normal with Dr. Brita Romp due 2025 BMD:   n/a  Result  n/a TDaP:  2012 Gardasil:   no HIV and Hep C: 03/28/16 Negative Screening Labs: PCP   reports that she has never smoked. She has never used smokeless tobacco. She reports that she drinks about 2.0 standard drinks of alcohol per week. She reports that she does not use drugs.  Past Medical History:  Diagnosis Date  . C. difficile colitis   . Chondromalacia   . Dysplasia of cervix, low grade (CIN 1) 1991  . Fibroid   . HSV-1 infection 2010  . Lumbar spondylosis   . Social anxiety disorder 1998    Past Surgical History:  Procedure Laterality Date  . APPENDECTOMY    . BREAST ENHANCEMENT SURGERY  2008   [facility] [place])  . TUBAL LIGATION      Current Outpatient Medications   Medication Sig Dispense Refill  . celecoxib (CELEBREX) 200 MG capsule Take 1 capsule by mouth 2 (two) times daily.    . clotrimazole (GYNE-LOTRIMIN) 1 % vaginal cream Place 1 Applicatorful vaginally as needed.    Marland Kitchen escitalopram (LEXAPRO) 20 MG tablet Take 20 mg by mouth daily.    Marland Kitchen triamcinolone cream (KENALOG) 0.1 % APPLY TO RASH ON HANDS TWICE DAILY AS NEEDED  2  . valACYclovir (VALTREX) 500 MG tablet Take 500 mg by mouth 2 (two) times daily.     No current facility-administered medications for this visit.     Family History  Problem Relation Age of Onset  . Cancer Father        lung  . Cancer Brother        testicular cancer  . Osteoarthritis Mother   . Migraines Mother   . Thyroid disease Mother   . Osteoarthritis Maternal Grandmother   . Hypertension Paternal Grandmother   . Stroke Paternal Grandmother     Review of Systems  Genitourinary: Positive for vaginal discharge.       Vulvar itching Loss of urine with sneeze or cough  All other systems reviewed and are negative.   Exam:   BP 102/60 (BP Location: Right Arm, Patient Position: Sitting, Cuff Size:  Normal)   Pulse 76   Resp 16   Ht 5' 3.5" (1.613 m)   Wt 176 lb (79.8 kg)   LMP 05/03/2018 (Within Days)   BMI 30.69 kg/m     General appearance: alert, cooperative and appears stated age Head: Normocephalic, without obvious abnormality, atraumatic Neck: no adenopathy, supple, symmetrical, trachea midline and thyroid normal to inspection and palpation Lungs: clear to auscultation bilaterally Breasts: normal appearance, no masses or tenderness, bilateral implants,  No nipple retraction or dimpling, No nipple discharge or bleeding, No axillary or supraclavicular adenopathy Heart: regular rate and rhythm Abdomen: soft, non-tender; no masses, no organomegaly Extremities: extremities normal, atraumatic, no cyanosis or edema Skin: Skin color, texture, turgor normal. No rashes or lesions Lymph nodes: Cervical,  supraclavicular, and axillary nodes normal. No abnormal inguinal nodes palpated Neurologic: Grossly normal  Pelvic: External genitalia:  no lesions              Urethra:  normal appearing urethra with no masses, tenderness or lesions              Bartholins and Skenes: normal                 Vagina: normal appearing vagina with normal color and discharge, no lesions              Cervix: no lesions              Pap taken: No. Bimanual Exam:  Uterus:   8 week size, non-tender              Adnexa: no mass, fullness, tenderness              Rectal exam: Yes.  .  Confirms.              Anus:  normal sphincter tone, no lesions  Chaperone was present for exam.  Assessment:   Well woman visit with normal exam. Status post Depo Lupron for pelvic pain and uterine fibroids.  Vulvovaginitis.  Bilateral breast implants.  Stress incontinence.   Plan: Mammogram screening. Recommended self breast awareness. Pap and HR HPV as above. Guidelines for Calcium, Vitamin D, regular exercise program including cardiovascular and weight bearing exercise. Affirm.  Will wait for final results for tx.  Call if no menses for 3 months.  Discussed genuine stress incontinence. Discussed pessary, Impressa, PT and surgery.  She will observe. Follow up annually and prn.   After visit summary provided.

## 2018-06-05 ENCOUNTER — Other Ambulatory Visit: Payer: Self-pay

## 2018-06-05 ENCOUNTER — Encounter: Payer: Self-pay | Admitting: Obstetrics and Gynecology

## 2018-06-05 ENCOUNTER — Ambulatory Visit: Payer: 59 | Admitting: Obstetrics and Gynecology

## 2018-06-05 VITALS — BP 102/60 | HR 76 | Resp 16 | Ht 63.5 in | Wt 176.0 lb

## 2018-06-05 DIAGNOSIS — N760 Acute vaginitis: Secondary | ICD-10-CM | POA: Diagnosis not present

## 2018-06-05 DIAGNOSIS — Z01419 Encounter for gynecological examination (general) (routine) without abnormal findings: Secondary | ICD-10-CM | POA: Diagnosis not present

## 2018-06-05 NOTE — Patient Instructions (Signed)

## 2018-06-06 LAB — VAGINITIS/VAGINOSIS, DNA PROBE
CANDIDA SPECIES: POSITIVE — AB
GARDNERELLA VAGINALIS: NEGATIVE
TRICHOMONAS VAG: NEGATIVE

## 2018-06-09 MED ORDER — FLUCONAZOLE 150 MG PO TABS: 150.0000 mg | ORAL_TABLET | Freq: Once | ORAL | 0 refills | Status: AC

## 2018-06-09 NOTE — Addendum Note (Signed)
Addended by: Yisroel Ramming, Dietrich Pates E on: 06/09/2018 05:01 PM   Modules accepted: Orders

## 2018-09-09 ENCOUNTER — Other Ambulatory Visit: Payer: Self-pay | Admitting: Family Medicine

## 2018-09-09 DIAGNOSIS — R1010 Upper abdominal pain, unspecified: Secondary | ICD-10-CM

## 2018-09-15 ENCOUNTER — Ambulatory Visit
Admission: RE | Admit: 2018-09-15 | Discharge: 2018-09-15 | Disposition: A | Discharge status: Home / Self Care | Payer: 59 | Source: Ambulatory Visit | Attending: Family Medicine | Admitting: Family Medicine

## 2018-09-15 DIAGNOSIS — R1010 Upper abdominal pain, unspecified: Secondary | ICD-10-CM

## 2018-09-22 ENCOUNTER — Other Ambulatory Visit: Payer: 59

## 2018-11-23 IMAGING — CR DG HIP (WITH OR WITHOUT PELVIS) 2-3V*R*
2 series · 2 of 2 positions shown · non-contrast
Comparison: None.

CLINICAL DATA: Right hip pain

EXAM:
DG HIP (WITH OR WITHOUT PELVIS) 2-3V RIGHT

[w hip ap right]
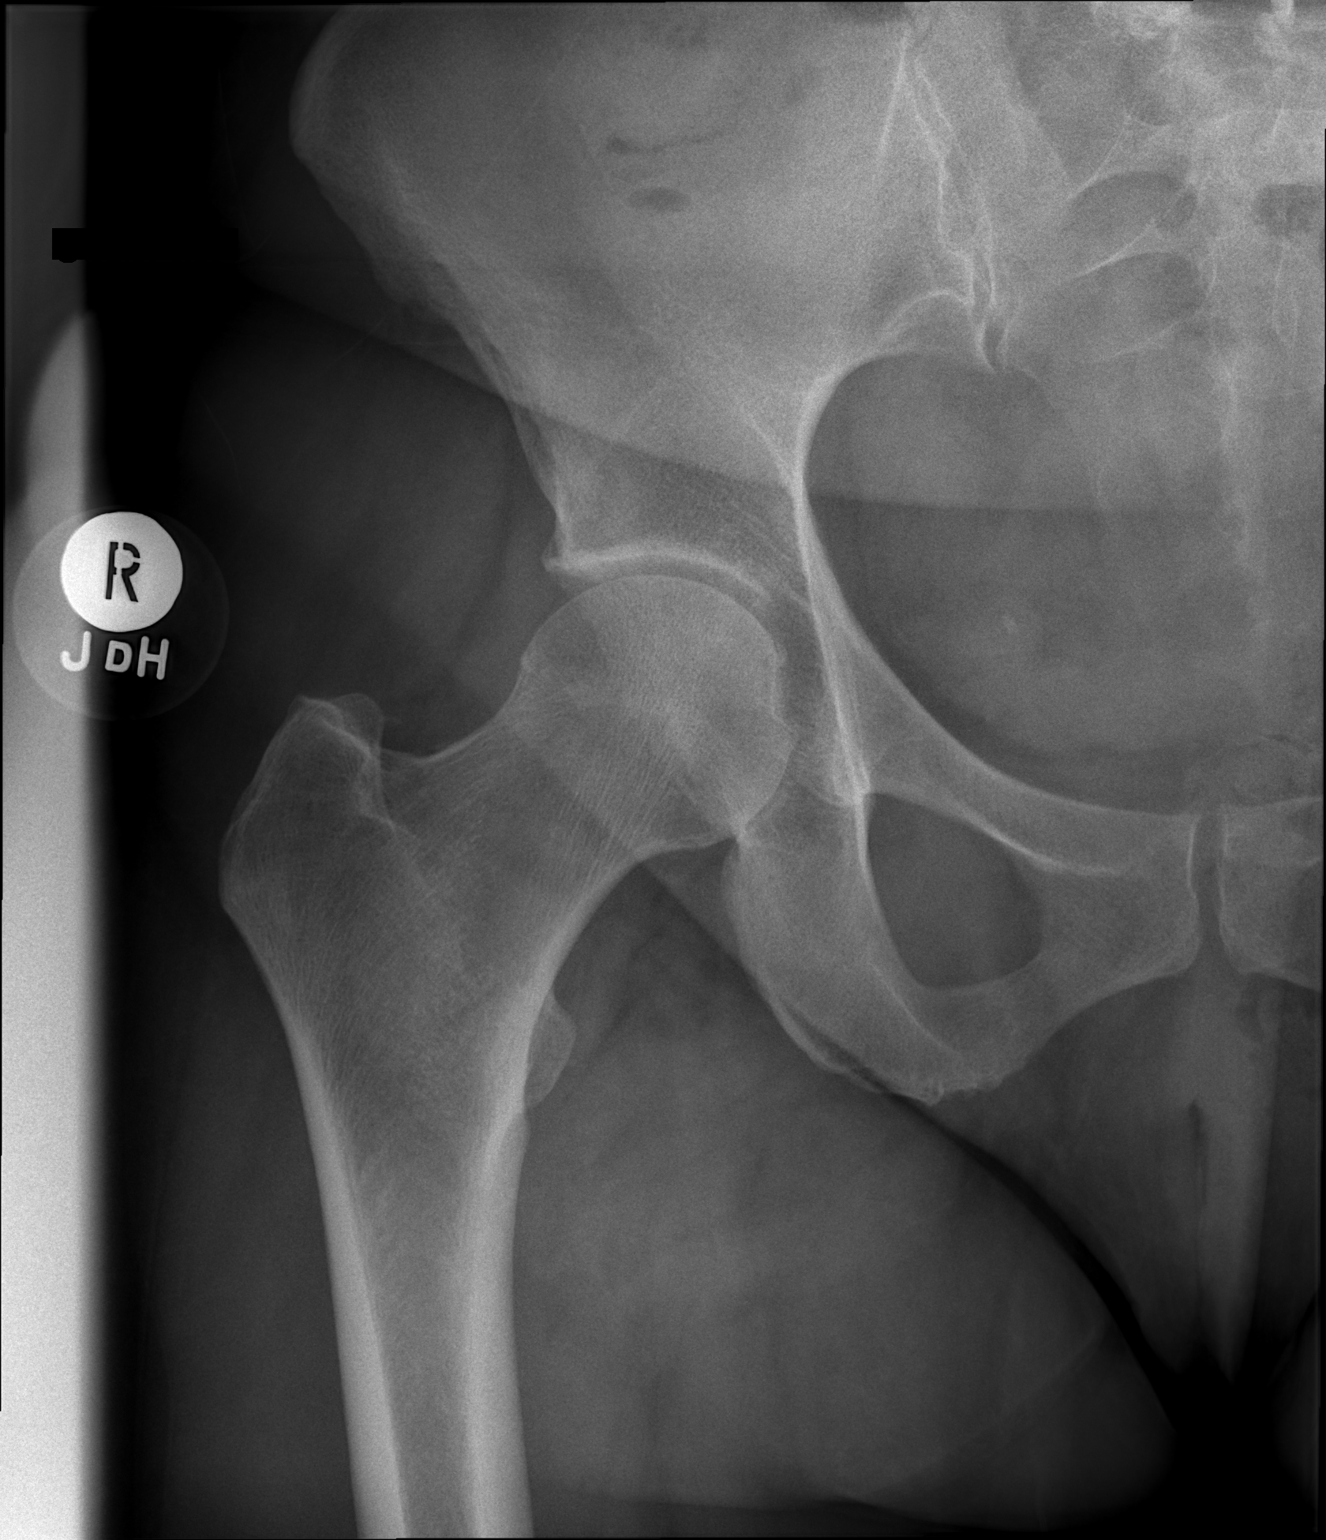

[w hip frog right]
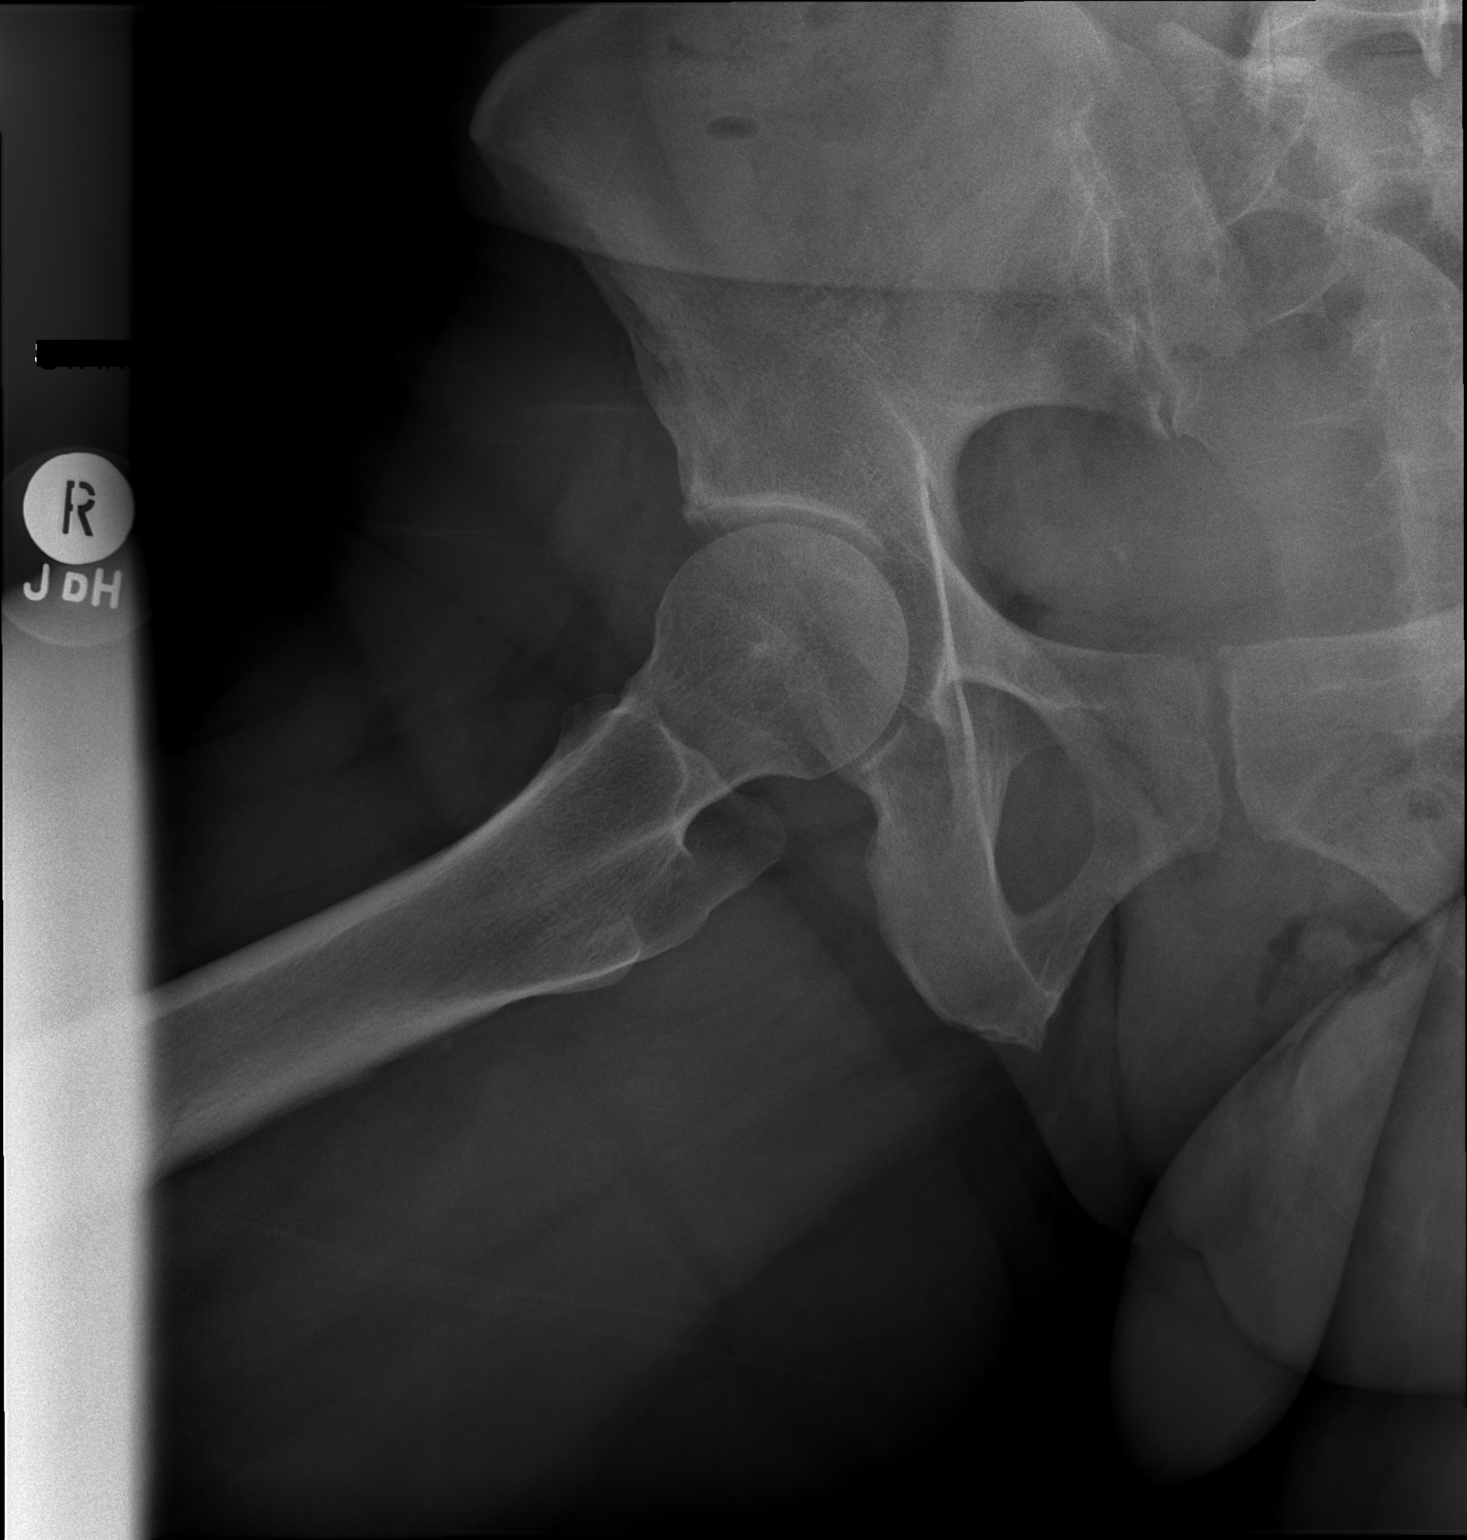

[2 of 2 positions shown; findings below may reference images not displayed]

FINDINGS: There is no evidence of hip fracture or dislocation. There is no
evidence of arthropathy or other focal bone abnormality.
IMPRESSION: Negative.

## 2019-06-04 ENCOUNTER — Other Ambulatory Visit: Payer: Self-pay

## 2019-06-08 ENCOUNTER — Ambulatory Visit: Payer: 59 | Admitting: Obstetrics and Gynecology

## 2019-06-08 ENCOUNTER — Other Ambulatory Visit: Payer: Self-pay

## 2019-06-08 NOTE — Progress Notes (Signed)
58 y.o. G60P2002 Married Caucasian female here for annual exam.    Patient is skipping cycles. No menses Oct - January.  Menses in Feb.  No menses in March and April. Monthly cycles since May, 2020.  Menses last 6 - 7 days.  No hot flashes.   Not experiencing a lot of back or pelvic pain.   She leaks with a hard sneeze or heavy cough. She is not interested in surgical treatment at this time. She does use a pad.   She has a burning sensation around the anal opening.  This is continual.  Does not use Valtrex regularly. She denies rectal bleeding.   Uncertain if she has any hemorrhoids.   Stress at home--son dealing with addiction problems. Started working again in June.   PCP:  Derinda Late, MD   Patient's last menstrual period was 05/19/2019 (approximate).     Period Duration (Days): 7 days Period Pattern: (!) Irregular(skipping cycles) Menstrual Flow: Moderate(can be heavy at times) Menstrual Control: Maxi pad, Tampon(super or ultra tampon first few days with Maxi pad) Menstrual Control Change Freq (Hours): every2 hours on heaviest day Dysmenorrhea: (!) Mild Dysmenorrhea Symptoms: Cramping, Headache     Sexually active: Yes.    The current method of family planning is tubal ligation.    Exercising: No.  The patient does not participate in regular exercise at present. Smoker:  no  Health Maintenance: Pap: 03-28-16 Neg:neg HR HPV, 02-04-14 Neg:Neg HR HPV History of abnormal Pap:  Yes, Hx of CIN 1 1991 MMG: 07-01-17 Implants/Neg/density C/BiRads1 Colonoscopy:  2015 normal;next due 2025 BMD:   n/a  Result  n/a TDaP:  2012 Gardasil:   n/a HIV:03-28-16 NR Hep C:03-28-16 Neg Screening Labs:  PCP/    reports that she has never smoked. She has never used smokeless tobacco. She reports current alcohol use of about 2.0 standard drinks of alcohol per week. She reports that she does not use drugs.  Past Medical History:  Diagnosis Date  . C. difficile colitis   .  Chondromalacia   . Dysplasia of cervix, low grade (CIN 1) 1991  . Fibroid   . HSV-1 infection 2010  . Lumbar spondylosis   . Social anxiety disorder 1998    Past Surgical History:  Procedure Laterality Date  . APPENDECTOMY    . BREAST ENHANCEMENT SURGERY  2008   [facility])  . TUBAL LIGATION      Current Outpatient Medications  Medication Sig Dispense Refill  . celecoxib (CELEBREX) 200 MG capsule Take 1 capsule by mouth 2 (two) times daily.    Marland Kitchen escitalopram (LEXAPRO) 20 MG tablet Take 20 mg by mouth daily.    Marland Kitchen triamcinolone cream (KENALOG) 0.1 % APPLY TO RASH ON HANDS TWICE DAILY AS NEEDED  2  . valACYclovir (VALTREX) 500 MG tablet Take 500 mg by mouth 2 (two) times daily.     No current facility-administered medications for this visit.     Family History  Problem Relation Age of Onset  . Cancer Father        lung  . Cancer Brother        testicular cancer  . Osteoarthritis Mother   . Migraines Mother   . Thyroid disease Mother   . Osteoarthritis Maternal Grandmother   . Hypertension Paternal Grandmother   . Stroke Paternal Grandmother     Review of Systems  Gastrointestinal:       Anal burning  All other systems reviewed and are negative.   Exam:  BP 110/68 (Cuff Size: Large)   Pulse 76   Temp (!) 97.3 F (36.3 C) (Temporal)   Resp 20   Ht 5\' 3"  (1.6 m)   Wt 177 lb 9.6 oz (80.6 kg)   LMP 05/19/2019 (Approximate)   BMI 31.46 kg/m     General appearance: alert, cooperative and appears stated age Head: normocephalic, without obvious abnormality, atraumatic Neck: no adenopathy, supple, symmetrical, trachea midline and thyroid normal to inspection and palpation Lungs: clear to auscultation bilaterally Breasts: bilateral implants, no masses or tenderness, No nipple retraction or dimpling, No nipple discharge or bleeding, No axillary adenopathy Heart: regular rate and rhythm Abdomen: soft, non-tender; no masses, no organomegaly Extremities: extremities  normal, atraumatic, no cyanosis or edema Skin: skin color, texture, turgor normal. No rashes or lesions Lymph nodes: cervical, supraclavicular, and axillary nodes normal. Neurologic: grossly normal  Pelvic: External genitalia:  no lesions              No abnormal inguinal nodes palpated.              Urethra:  normal appearing urethra with no masses, tenderness or lesions              Bartholins and Skenes: normal                 Vagina: normal appearing vagina with normal color and discharge, no lesions              Cervix: no lesions              Pap taken: No. Bimanual Exam:  Uterus:  Uterus 8 - 9 week size with posterior fibroid noted.               Adnexa: no mass, fullness, tenderness              Rectal exam: Yes.  .  Confirms.              Anus:  normal sphincter tone, no lesions  Chaperone was present for exam.  Assessment:   Well woman visit with normal exam. Status post Depo Lupron for pelvic pain and uterine fibroids.  Vulvovaginitis.  Bilateral breast implants.  Stress incontinence.  Hx HSV I.  Possible recurrent outbreaks in anal region.   Plan: Mammogram screening discussed.  She will schedule. Self breast awareness reviewed. Pap and HR HPV as above. Guidelines for Calcium, Vitamin D, regular exercise program including cardiovascular and weight bearing exercise. Flu vaccine recommended.  Pelvic floor PT.  Valtrex Rx.  Follow up annually and prn.   After visit summary provided.

## 2019-06-08 NOTE — Progress Notes (Deleted)
58 y.o. G27P2002 Married Caucasian female here for annual exam.    PCP:     No LMP recorded.           Sexually active: {yes no:314532}  The current method of family planning is tubal ligation.    Exercising: {yes no:314532}  {types:19826} Smoker:  no  Health Maintenance: Pap: 03-28-16 Neg:Neg HR HPV, 02-04-14 Neg:Neg HR HPv History of abnormal Pap:  Yes, Hx of CIN 1 1991 MMG:  ***07-01-17 Implants/Neg/density C/Birads1 Colonoscopy:  2015 normal;next 2025 BMD:   ***  Result  *** TDaP:  2012 Gardasil:   n/a HIV:03-28-16 NR Hep C:03-28-16 Neg Screening Labs:  Hb today: ***, Urine today: ***   reports that she has never smoked. She has never used smokeless tobacco. She reports current alcohol use of about 2.0 standard drinks of alcohol per week. She reports that she does not use drugs.  Past Medical History:  Diagnosis Date  . C. difficile colitis   . Chondromalacia   . Dysplasia of cervix, low grade (CIN 1) 1991  . Fibroid   . HSV-1 infection 2010  . Lumbar spondylosis   . Social anxiety disorder 1998    Past Surgical History:  Procedure Laterality Date  . APPENDECTOMY    . BREAST ENHANCEMENT SURGERY  2008   [facility])  . TUBAL LIGATION      Current Outpatient Medications  Medication Sig Dispense Refill  . celecoxib (CELEBREX) 200 MG capsule Take 1 capsule by mouth 2 (two) times daily.    . clotrimazole (GYNE-LOTRIMIN) 1 % vaginal cream Place 1 Applicatorful vaginally as needed.    Marland Kitchen escitalopram (LEXAPRO) 20 MG tablet Take 20 mg by mouth daily.    Marland Kitchen triamcinolone cream (KENALOG) 0.1 % APPLY TO RASH ON HANDS TWICE DAILY AS NEEDED  2  . valACYclovir (VALTREX) 500 MG tablet Take 500 mg by mouth 2 (two) times daily.     No current facility-administered medications for this visit.     Family History  Problem Relation Age of Onset  . Cancer Father        lung  . Cancer Brother        testicular cancer  . Osteoarthritis Mother   . Migraines Mother   . Thyroid  disease Mother   . Osteoarthritis Maternal Grandmother   . Hypertension Paternal Grandmother   . Stroke Paternal Grandmother     Review of Systems  Exam:   There were no vitals taken for this visit.    General appearance: alert, cooperative and appears stated age Head: normocephalic, without obvious abnormality, atraumatic Neck: no adenopathy, supple, symmetrical, trachea midline and thyroid normal to inspection and palpation Lungs: clear to auscultation bilaterally Breasts: normal appearance, no masses or tenderness, No nipple retraction or dimpling, No nipple discharge or bleeding, No axillary adenopathy Heart: regular rate and rhythm Abdomen: soft, non-tender; no masses, no organomegaly Extremities: extremities normal, atraumatic, no cyanosis or edema Skin: skin color, texture, turgor normal. No rashes or lesions Lymph nodes: cervical, supraclavicular, and axillary nodes normal. Neurologic: grossly normal  Pelvic: External genitalia:  no lesions              No abnormal inguinal nodes palpated.              Urethra:  normal appearing urethra with no masses, tenderness or lesions              Bartholins and Skenes: normal  Vagina: normal appearing vagina with normal color and discharge, no lesions              Cervix: no lesions              Pap taken: {yes no:314532} Bimanual Exam:  Uterus:  normal size, contour, position, consistency, mobility, non-tender              Adnexa: no mass, fullness, tenderness              Rectal exam: {yes no:314532}.  Confirms.              Anus:  normal sphincter tone, no lesions  Chaperone was present for exam.  Assessment:   Well woman visit with normal exam.   Plan: Mammogram screening discussed. Self breast awareness reviewed. Pap and HR HPV as above. Guidelines for Calcium, Vitamin D, regular exercise program including cardiovascular and weight bearing exercise.   Follow up annually and prn.   Additional  counseling given.  {yes [id number]. _______ minutes face to face time of which over 50% was spent in counseling.    After visit summary provided.

## 2019-06-09 ENCOUNTER — Other Ambulatory Visit: Payer: Self-pay | Admitting: Obstetrics and Gynecology

## 2019-06-09 ENCOUNTER — Ambulatory Visit (INDEPENDENT_AMBULATORY_CARE_PROVIDER_SITE_OTHER): Payer: 59 | Admitting: Obstetrics and Gynecology

## 2019-06-09 ENCOUNTER — Encounter: Payer: Self-pay | Admitting: Obstetrics and Gynecology

## 2019-06-09 VITALS — BP 110/68 | HR 76 | Temp 97.3°F | Resp 20 | Ht 63.0 in | Wt 177.6 lb

## 2019-06-09 DIAGNOSIS — Z1231 Encounter for screening mammogram for malignant neoplasm of breast: Secondary | ICD-10-CM

## 2019-06-09 DIAGNOSIS — Z01419 Encounter for gynecological examination (general) (routine) without abnormal findings: Secondary | ICD-10-CM

## 2019-06-09 DIAGNOSIS — N393 Stress incontinence (female) (male): Secondary | ICD-10-CM | POA: Diagnosis not present

## 2019-06-09 MED ORDER — VALACYCLOVIR HCL 500 MG PO TABS: ORAL_TABLET | ORAL | 3 refills | Status: DC

## 2019-06-09 NOTE — Patient Instructions (Signed)

## 2019-07-27 ENCOUNTER — Ambulatory Visit: Payer: 59

## 2019-08-17 ENCOUNTER — Other Ambulatory Visit: Payer: Self-pay

## 2019-08-17 ENCOUNTER — Ambulatory Visit
Admission: RE | Admit: 2019-08-17 | Discharge: 2019-08-17 | Disposition: A | Discharge status: Home / Self Care | Payer: 59 | Source: Ambulatory Visit | Attending: Obstetrics and Gynecology | Admitting: Obstetrics and Gynecology

## 2019-08-17 DIAGNOSIS — Z1231 Encounter for screening mammogram for malignant neoplasm of breast: Secondary | ICD-10-CM

## 2020-05-02 ENCOUNTER — Ambulatory Visit (INDEPENDENT_AMBULATORY_CARE_PROVIDER_SITE_OTHER): Payer: 59

## 2020-05-02 ENCOUNTER — Ambulatory Visit: Payer: 59 | Admitting: Podiatry

## 2020-05-02 ENCOUNTER — Other Ambulatory Visit: Payer: Self-pay

## 2020-05-02 DIAGNOSIS — M722 Plantar fascial fibromatosis: Secondary | ICD-10-CM | POA: Diagnosis not present

## 2020-05-02 DIAGNOSIS — L600 Ingrowing nail: Secondary | ICD-10-CM | POA: Diagnosis not present

## 2020-05-02 DIAGNOSIS — M79671 Pain in right foot: Secondary | ICD-10-CM | POA: Diagnosis not present

## 2020-05-02 MED ORDER — MELOXICAM 15 MG PO TABS: 15.0000 mg | ORAL_TABLET | Freq: Every day | ORAL | 0 refills | Status: DC

## 2020-05-02 MED ORDER — SULFAMETHOXAZOLE-TRIMETHOPRIM 800-160 MG PO TABS: 1.0000 | ORAL_TABLET | Freq: Two times a day (BID) | ORAL | 0 refills | Status: DC

## 2020-05-02 NOTE — Patient Instructions (Addendum)
  For instructions on how to put on your Plantar Fascial Brace, please visit [email or web address]   Place 1/4 cup of epsom salts in a quart of warm tap water.  Submerge your foot or feet in the solution and soak for 20 minutes.  This soak should be done twice a day.  Next, remove your foot or feet from solution, blot dry the affected area. Apply ointment and cover if instructed by your doctor.   IF YOUR SKIN BECOMES IRRITATED WHILE USING THESE INSTRUCTIONS, IT IS OKAY TO SWITCH TO  WHITE VINEGAR AND WATER.  As another alternative soak, you may use antibacterial soap and water.  Monitor for any signs/symptoms of infection. Call the office immediately if any occur or go directly to the emergency room. Call with any questions/concerns.  [facility] Instructions-Post Nail Surgery  You have had your ingrown toenail and root treated with a chemical.  This chemical causes a burn that will drain and ooze like a blister.  This can drain for 6-8 weeks or longer.  It is important to keep this area clean, covered, and follow the soaking instructions dispensed at the time of your surgery.  This area will eventually dry and form a scab.  Once the scab forms you no longer need to soak or apply a dressing.  If at any time you experience an increase in pain, redness, swelling, or drainage, you should contact the office as soon as possible.

## 2020-05-03 NOTE — Progress Notes (Signed)
Subjective:   Patient ID: Lisa Wade, female   DOB: 59 y.o.   MRN: [id number]   HPI 59 year old female presents the office today for couple concerns.  Her main concern she has an ingrown terms of the right big toe, lateral aspect which is been intermittent for the last couple of years but seems to be getting worse recently.  Denies any drainage or pus or any red streaks.  Hurts with pressure.  She also secondary concerns of pain to the bottom of her right heel.  This started when she went into a pool and she was doing more exercise than normal.  Describes discomfort in the bottom of the heel.  No injury or falls.  No swelling or redness.  No rating pain or weakness.  No other concerns today.   Review of Systems  All other systems reviewed and are negative.  Past Medical History:  Diagnosis Date  . C. difficile colitis   . Chondromalacia   . Dysplasia of cervix, low grade (CIN 1) 1991  . Fibroid   . HSV-1 infection 2010  . Lumbar spondylosis   . Social anxiety disorder 1998    Past Surgical History:  Procedure Laterality Date  . APPENDECTOMY    . BREAST ENHANCEMENT SURGERY  2008   [facility])  . TUBAL LIGATION       Current Outpatient Medications:  .  celecoxib (CELEBREX) 200 MG capsule, Take 1 capsule by mouth 2 (two) times daily., Disp: , Rfl:  .  escitalopram (LEXAPRO) 20 MG tablet, Take 20 mg by mouth daily., Disp: , Rfl:  .  meloxicam (MOBIC) 15 MG tablet, Take 1 tablet (15 mg total) by mouth daily., Disp: 30 tablet, Rfl: 0 .  sulfamethoxazole-trimethoprim (BACTRIM DS) 800-160 MG tablet, Take 1 tablet by mouth 2 (two) times daily., Disp: 14 tablet, Rfl: 0 .  triamcinolone cream (KENALOG) 0.1 %, APPLY TO RASH ON HANDS TWICE DAILY AS NEEDED, Disp: , Rfl: 2 .  valACYclovir (VALTREX) 500 MG tablet, Take one tablet (500 mg) by mouth daily.  Take one tablet (500 mg) by mouth twice daily for an outbreak., Disp: 100 tablet, Rfl: 3  Allergies  Allergen Reactions  .  Cephalosporins          Objective:  Physical Exam  General: AAO x3, NAD  Dermatological: Incurvation present to lateral aspect the right hallux toenail with localized edema and erythema there is no drainage or pus or ascending cellulitis.  There is no open lesions.  Vascular: Dorsalis Pedis artery and Posterior Tibial artery pedal pulses are 2/4 bilateral with immedate capillary fill time. There is no pain with calf compression, swelling, warmth, erythema.   Neruologic: Grossly intact via light touch bilateral.  Negative Tinel sign  Musculoskeletal: Tenderness to palpation along the plantar medial tubercle of the calcaneus at the insertion of plantar fascia on the right foot. There is no pain along the course of the plantar fascia within the arch of the foot. Plantar fascia appears to be intact. There is no pain with lateral compression of the calcaneus or pain with vibratory sensation. There is no pain along the course or insertion of the achilles tendon.  Tenderness to the lateral aspect the right hallux toenail.  No other areas of tenderness to bilateral lower extremities. Muscular strength 5/5 in all groups tested bilateral.  Gait: Unassisted, Nonantalgic.       Assessment:   Right lateral hallux ingrown toenail, plantar fasciitis     Plan:  -Treatment  options discussed including all alternatives, risks, and complications -Etiology of symptoms were discussed  1. Ingrown toenail -At this time, the patient is requesting partial nail removal with chemical matricectomy to the symptomatic portion of the nail. Risks and complications were discussed with the patient for which they understand and written consent was obtained. Under sterile conditions a total of 3 mL of a mixture of 2% lidocaine plain and 0.5% Marcaine plain was infiltrated in a hallux block fashion. Once anesthetized, the skin was prepped in sterile fashion. A tourniquet was then applied. Next the lateral aspect of hallux  nail border was then sharply excised making sure to remove the entire offending nail border. Once the nails were ensured to be removed area was debrided and the underlying skin was intact. There is no purulence identified in the procedure. Next phenol was then applied under standard conditions and copiously irrigated. Silvadene was applied. A dry sterile dressing was applied. After application of the dressing the tourniquet was removed and there is found to be an immediate capillary refill time to the digit. The patient tolerated the procedure well any complications. Post procedure instructions were discussed the patient for which he verbally understood. Follow-up in one week for nail check or sooner if any problems are to arise. Discussed signs/symptoms of infection and directed to call the office immediately should any occur or go directly to the emergency room. In the meantime, encouraged to call the office with any questions, concerns, changes symptoms. -Bactrim  2.  Right heel pain, plantar fasciitis -X-rays were obtained and reviewed with the patient.  Notes of acute fracture identified at this time. -Steroid injection performed.  See procedure note below -Stretching, icing daily -Plantar fascial brace -Prescribed mobic. Discussed side effects of the medication and directed to stop if any are to occur and call the office.   Procedure: Injection Tendon/Ligament Discussed alternatives, risks, complications and verbal consent was obtained.  Location: Right plantar fascia at the glabrous junction; medial approach. Skin Prep: Alcohol. Injectate: 0.5cc 0.5% marcaine plain, 0.5 cc 2% lidocaine plain and, 1 cc kenalog 10. Disposition: Patient tolerated procedure well. Injection site dressed with a band-aid.  Post-injection care was discussed and return precautions discussed.   Return in 2 weeks (on 05/16/2020) for nail check-right big toe.  Trula Slade DPM

## 2020-05-09 ENCOUNTER — Ambulatory Visit (INDEPENDENT_AMBULATORY_CARE_PROVIDER_SITE_OTHER): Payer: 59 | Admitting: Orthotics

## 2020-05-09 ENCOUNTER — Other Ambulatory Visit: Payer: Self-pay

## 2020-05-09 DIAGNOSIS — M722 Plantar fascial fibromatosis: Secondary | ICD-10-CM | POA: Diagnosis not present

## 2020-05-09 DIAGNOSIS — M79671 Pain in right foot: Secondary | ICD-10-CM

## 2020-05-09 NOTE — Progress Notes (Signed)

## 2020-05-16 ENCOUNTER — Other Ambulatory Visit: Payer: Self-pay

## 2020-05-16 ENCOUNTER — Ambulatory Visit (INDEPENDENT_AMBULATORY_CARE_PROVIDER_SITE_OTHER): Payer: 59 | Admitting: Podiatry

## 2020-05-16 DIAGNOSIS — M722 Plantar fascial fibromatosis: Secondary | ICD-10-CM

## 2020-05-16 DIAGNOSIS — L600 Ingrowing nail: Secondary | ICD-10-CM

## 2020-05-16 NOTE — Patient Instructions (Signed)

## 2020-05-17 DIAGNOSIS — L600 Ingrowing nail: Secondary | ICD-10-CM | POA: Insufficient documentation

## 2020-05-17 DIAGNOSIS — M722 Plantar fascial fibromatosis: Secondary | ICD-10-CM | POA: Insufficient documentation

## 2020-05-17 NOTE — Progress Notes (Signed)
Subjective: Lisa Wade is a 59 y.o.  Female returns to office today for follow up evaluation after having right Hallux partial nail avulsion performed. Patient has been soaking using epsom salts and applying topical antibiotic covered with bandaid daily.  Still with some discomfort on the medial aspect of the nail distally.  Denies any drainage or pus.  Patient denies fevers, chills, nausea, vomiting. Denies any calf pain, chest pain, SOB.   Objective:  General: Well developed, nourished, in no acute distress, alert and oriented x3   Dermatology: Skin is warm, dry and supple bilateral.  Right hallux nail border appears to be clean, dry, with mild granular tissue and surrounding scab.  On the medial border of the distal portion is smaller granulation tissue present.  There is mild surrounding edema but there is no ascending cellulitis there is no drainage or pus identified today.   Neurovascular status: Intact. No lower extremity swelling; No pain with calf compression bilateral.  Musculoskeletal: Mild tenderness to palpation of the medial hallux nail fold. Muscular strength within normal limits bilateral.   Assesement and Plan: S/p partial nail avulsion, doing well.   -Continue soaking in epsom salts twice a day followed by antibiotic ointment and a band-aid. Can leave uncovered at night. Continue this until completely healed.  -I debrided the medial portion of the nail border any complications.  It appears that there is some granulation tissue and the skin is starting to grow over the nail.  Hopefully continue soaking and washing with soap and water well with this but we did discuss having to remove a small portion of nail and to debride the skin to avoid from becoming ingrown again if needed.  We will continue to monitor this. -Regards the plantar fasciitis continue stretching, icing daily.  Discussed shoe modifications and orthotics.  Injection was helpful for a couple of days. -If the area  has not healed in 2 weeks, call the office for follow-up appointment, or sooner if any problems arise.  -Monitor for any signs/symptoms of infection. Call the office immediately if any occur or go directly to the emergency room. Call with any questions/concerns.  Celesta Gentile, DPM

## 2020-05-30 ENCOUNTER — Other Ambulatory Visit: Payer: Self-pay

## 2020-05-30 ENCOUNTER — Ambulatory Visit: Payer: 59 | Admitting: Podiatry

## 2020-05-30 ENCOUNTER — Encounter: Payer: Self-pay | Admitting: Podiatry

## 2020-05-30 ENCOUNTER — Encounter: Payer: 59 | Admitting: Orthotics

## 2020-05-30 DIAGNOSIS — L600 Ingrowing nail: Secondary | ICD-10-CM

## 2020-05-30 DIAGNOSIS — M722 Plantar fascial fibromatosis: Secondary | ICD-10-CM

## 2020-05-30 NOTE — Patient Instructions (Signed)

## 2020-06-01 NOTE — Progress Notes (Signed)
Subjective: Lisa Wade is a 59 y.o. female returns to office today for follow up evaluation after having right Hallux partial nail avulsion performed. She states that the right big toenail is doing much better. No pain. She is starting to get pain on the left big toenail, medial border but no redness, drainage, or signs of infection. She also presents today to [facility].  Patient denies fevers, chills, nausea, vomiting. Denies any calf pain, chest pain, SOB.   Objective:  General: Well developed, nourished, in no acute distress, alert and oriented x3   Dermatology: Skin is warm, dry and supple bilateral. The right medial nail border is doing better and is clean, dry and healed. No granulation tissue. Slight surrounding erythema without any ascending cellulitis, fluctuation, crepitation. Mild pain to the left medial nail border. No drainage or pus.   Neurovascular status: Intact. No lower extremity swelling; No pain with calf compression bilateral.  Musculoskeletal: No tenderness to palpation of the medial right hallux nail fold. Muscular strength within normal limits bilateral.   Assesement and Plan: S/p partial nail avulsion, doing well; left hallux ingrown toenail; [facility]  -Continue soaking in epsom salts twice a day followed by antibiotic ointment and a band-aid. Can leave uncovered at night. I would do this for the next week. Discussed partial nail avulsion of the left hallux medial nail border but she wants to wait until the right side is completely healed.  -Orthotics were dispensed today with oral and written break-in instructions.   Return in about 4 weeks (around 06/27/2020) for ingrown toenail left big toe.  Trula Slade DPM

## 2020-06-08 ENCOUNTER — Other Ambulatory Visit: Payer: Self-pay

## 2020-06-08 ENCOUNTER — Encounter: Payer: Self-pay | Admitting: Obstetrics and Gynecology

## 2020-06-08 ENCOUNTER — Ambulatory Visit: Payer: 59 | Admitting: Obstetrics and Gynecology

## 2020-06-08 VITALS — BP 122/60 | HR 66 | Resp 16 | Ht 63.25 in | Wt 185.8 lb

## 2020-06-08 DIAGNOSIS — N951 Menopausal and female climacteric states: Secondary | ICD-10-CM

## 2020-06-08 DIAGNOSIS — N393 Stress incontinence (female) (male): Secondary | ICD-10-CM | POA: Diagnosis not present

## 2020-06-08 DIAGNOSIS — Z01419 Encounter for gynecological examination (general) (routine) without abnormal findings: Secondary | ICD-10-CM | POA: Diagnosis not present

## 2020-06-08 NOTE — Progress Notes (Signed)
59 y.o. [id number] Married Caucasian female here for annual exam.    Did physical therapy for stress incontinence and bladder prolapsing.  She used to wear a panty liner and now wears a thick pad due to leakage.   She had about 5 menses in the last year.  They usually last 7 days, last one was 9 days.  Not really noticing hot flashes.   She and her husband are vaccinated against Covid.   PCP: Derinda Late, MD  Patient's last menstrual period was 05/12/2020.           Sexually active: Yes.    The current method of family planning is tubal ligation.    Exercising: No.  The patient does not participate in regular exercise at present. Smoker:  no  Health Maintenance: Pap: 03-28-16 Neg:neg HR HPV, 02-04-14 Neg:Neg HR HPV History of abnormal Pap:  Yes, Hx of CIN 1 1991 MMG:  08-17-19 3D/Implants/Neg/density C/BiRads1 Colonoscopy: 2015 normal;next due 2025 BMD: n/a  Result  n/a TDaP:  10-08-10 Gardasil:   no HIV:03-28-16 NR Hep C:03-28-16 Neg Screening Labs:  PCP.   reports that she has never smoked. She has never used smokeless tobacco. She reports current alcohol use of about 2.0 standard drinks of alcohol per week. She reports that she does not use drugs.  Past Medical History:  Diagnosis Date  . C. difficile colitis   . Chondromalacia   . Dysplasia of cervix, low grade (CIN 1) 1991  . Fibroid   . HSV-1 infection 2010  . Lumbar spondylosis   . Social anxiety disorder 1998    Past Surgical History:  Procedure Laterality Date  . APPENDECTOMY    . BREAST ENHANCEMENT SURGERY  2008   [facility] [place] [facility])  . TUBAL LIGATION      Current Outpatient Medications  Medication Sig Dispense Refill  . ALPRAZolam (XANAX) 0.5 MG tablet Take 0.5 mg by mouth at bedtime as needed for anxiety.    Marland Kitchen azelastine (ASTELIN) 0.1 % nasal spray Place 2 sprays into both nostrils 2 (two) times daily.    Marland Kitchen escitalopram (LEXAPRO) 20 MG tablet Take 20 mg by mouth daily.    . meloxicam (MOBIC) 15 MG tablet  Take 1 tablet (15 mg total) by mouth daily. 30 tablet 0  . rosuvastatin (CRESTOR) 10 MG tablet SMARTSIG:1 Tablet(s) By Mouth Every Evening    . triamcinolone cream (KENALOG) 0.1 % APPLY TO RASH ON HANDS TWICE DAILY AS NEEDED  2  . valACYclovir (VALTREX) 500 MG tablet Take one tablet (500 mg) by mouth daily.  Take one tablet (500 mg) by mouth twice daily for an outbreak. 100 tablet 3   No current facility-administered medications for this visit.    Family History  Problem Relation Age of Onset  . Cancer Father        lung  . Cancer Brother        testicular cancer  . Osteoarthritis Mother   . Migraines Mother   . Thyroid disease Mother   . Osteoarthritis Maternal Grandmother   . Hypertension Paternal Grandmother   . Stroke Paternal Grandmother     Review of Systems  Genitourinary:       Urinary incontinence  All other systems reviewed and are negative.   Exam:   BP 122/60   Pulse 66   Resp 16   Ht 5' 3.25" (1.607 m)   Wt 185 lb 12.8 oz (84.3 kg)   LMP 05/12/2020   BMI 32.65 kg/m  General appearance: alert, cooperative and appears stated age Head: normocephalic, without obvious abnormality, atraumatic Neck: no adenopathy, supple, symmetrical, trachea midline and thyroid normal to inspection and palpation Lungs: clear to auscultation bilaterally Breasts: bilateral implants, no masses or tenderness, No nipple retraction or dimpling, No nipple discharge or bleeding, No axillary adenopathy Heart: regular rate and rhythm Abdomen: soft, non-tender; no masses, no organomegaly Extremities: extremities normal, atraumatic, no cyanosis or edema Skin: skin color, texture, turgor normal. No rashes or lesions Lymph nodes: cervical, supraclavicular, and axillary nodes normal. Neurologic: grossly normal  Pelvic: External genitalia:  no lesions              No abnormal inguinal nodes palpated.              Urethra:  normal appearing urethra with no masses, tenderness or lesions               Bartholins and Skenes: normal                 Vagina: normal appearing vagina with normal color and discharge, no lesions.  First degree cystocele.               Cervix: no lesions              Pap taken: No. Bimanual Exam:  Uterus:  8 week size, irregular, nontender.              Adnexa: no mass, fullness, tenderness              Rectal exam: Yes.  .  Confirms.              Anus:  normal sphincter tone, no lesions  Chaperone was present for exam.  Assessment:   Well woman visit with normal exam. Perimenopause. Status post BTL.  Stress incontinence.  Uterine fibroids.  Hx HSV I.   Status post bilateral breast implants.   Plan: Mammogram screening discussed. Self breast awareness reviewed. Pap and HR HPV 2022. Guidelines for Calcium, Vitamin D, regular exercise program including cardiovascular and weight bearing exercise. Declines refill of Valtrex. Check FSH and estrogen.  Referral to physical therapy for stress incontinence.  We briefly discussed hysterectomy and midurethral sling if surgical care is desired.  Follow up annually and prn.   After visit summary provided.

## 2020-06-08 NOTE — Patient Instructions (Signed)

## 2020-06-09 LAB — FOLLICLE STIMULATING HORMONE: FSH: 29 m[IU]/mL

## 2020-06-09 LAB — ESTRADIOL: Estradiol: 79.5 pg/mL

## 2020-06-27 ENCOUNTER — Ambulatory Visit: Payer: 59 | Admitting: Podiatry

## 2020-06-27 ENCOUNTER — Other Ambulatory Visit: Payer: Self-pay

## 2020-06-27 DIAGNOSIS — L600 Ingrowing nail: Secondary | ICD-10-CM

## 2020-06-27 DIAGNOSIS — M722 Plantar fascial fibromatosis: Secondary | ICD-10-CM

## 2020-06-27 NOTE — Patient Instructions (Signed)
Soak Instructions    THE DAY AFTER THE PROCEDURE  Place 1/4 cup of epsom salts in a quart of warm tap water.  Submerge your foot or feet with outer bandage intact for the initial soak; this will allow the bandage to become moist and wet for easy lift off.  Once you remove your bandage, continue to soak in the solution for 20 minutes.  This soak should be done twice a day.  Next, remove your foot or feet from solution, blot dry the affected area and cover.  You may use a band aid large enough to cover the area or use gauze and tape.  Apply other medications to the area as directed by the doctor such as polysporin neosporin.  IF YOUR SKIN BECOMES IRRITATED WHILE USING THESE INSTRUCTIONS, IT IS OKAY TO SWITCH TO  WHITE VINEGAR AND WATER. Or you may use antibacterial soap and water to keep the toe clean  Monitor for any signs/symptoms of infection. Call the office immediately if any occur or go directly to the emergency room. Call with any questions/concerns.   [facility] Instructions-Post Nail Surgery  You have had your ingrown toenail and root treated with a chemical.  This chemical causes a burn that will drain and ooze like a blister.  This can drain for 6-8 weeks or longer.  It is important to keep this area clean, covered, and follow the soaking instructions dispensed at the time of your surgery.  This area will eventually dry and form a scab.  Once the scab forms you no longer need to soak or apply a dressing.  If at any time you experience an increase in pain, redness, swelling, or drainage, you should contact the office as soon as possible.   Plantar Fasciitis (Heel Spur Syndrome) with Rehab The plantar fascia is a fibrous, ligament-like, soft-tissue structure that spans the bottom of the foot. Plantar fasciitis is a condition that causes pain in the foot due to inflammation of the tissue. SYMPTOMS   Pain and tenderness on the underneath side of the foot.  Pain that worsens with  standing or walking. CAUSES  Plantar fasciitis is caused by irritation and injury to the plantar fascia on the underneath side of the foot. Common mechanisms of injury include:  Direct trauma to bottom of the foot.  Damage to a small nerve that runs under the foot where the main fascia attaches to the heel bone.  Stress placed on the plantar fascia due to bone spurs. RISK INCREASES WITH:   Activities that place stress on the plantar fascia (running, jumping, pivoting, or cutting).  Poor strength and flexibility.  Improperly fitted shoes.  Tight calf muscles.  Flat feet.  Failure to warm-up properly before activity.  Obesity. PREVENTION  Warm up and stretch properly before activity.  Allow for adequate recovery between workouts.  Maintain physical fitness:  Strength, flexibility, and endurance.  Cardiovascular fitness.  Maintain a health body weight.  Avoid stress on the plantar fascia.  Wear properly fitted shoes, including arch supports for individuals who have flat feet.  PROGNOSIS  If treated properly, then the symptoms of plantar fasciitis usually resolve without surgery. However, occasionally surgery is necessary.  RELATED COMPLICATIONS   Recurrent symptoms that may result in a chronic condition.  Problems of the lower back that are caused by compensating for the injury, such as limping.  Pain or weakness of the foot during push-off following surgery.  Chronic inflammation, scarring, and partial or complete fascia tear,  occurring more often from repeated injections.  TREATMENT  Treatment initially involves the use of ice and medication to help reduce pain and inflammation. The use of strengthening and stretching exercises may help reduce pain with activity, especially stretches of the Achilles tendon. These exercises may be performed at home or with a therapist. Your caregiver may recommend that you use heel cups of arch supports to help reduce stress on  the plantar fascia. Occasionally, corticosteroid injections are given to reduce inflammation. If symptoms persist for greater than 6 months despite non-surgical (conservative), then surgery may be recommended.   MEDICATION   If pain medication is necessary, then nonsteroidal anti-inflammatory medications, such as aspirin and ibuprofen, or other minor pain relievers, such as acetaminophen, are often recommended.  Do not take pain medication within 7 days before surgery.  Prescription pain relievers may be given if deemed necessary by your caregiver. Use only as directed and only as much as you need.  Corticosteroid injections may be given by your caregiver. These injections should be reserved for the most serious cases, because they may only be given a certain number of times.  HEAT AND COLD  Cold treatment (icing) relieves pain and reduces inflammation. Cold treatment should be applied for 10 to 15 minutes every 2 to 3 hours for inflammation and pain and immediately after any activity that aggravates your symptoms. Use ice packs or massage the area with a piece of ice (ice massage).  Heat treatment may be used prior to performing the stretching and strengthening activities prescribed by your caregiver, physical therapist, or athletic trainer. Use a heat pack or soak the injury in warm water.  SEEK IMMEDIATE MEDICAL CARE IF:  Treatment seems to offer no benefit, or the condition worsens.  Any medications produce adverse side effects.  EXERCISES- RANGE OF MOTION (ROM) AND STRETCHING EXERCISES - Plantar Fasciitis (Heel Spur Syndrome) These exercises may help you when beginning to rehabilitate your injury. Your symptoms may resolve with or without further involvement from your physician, physical therapist or athletic trainer. While completing these exercises, remember:   Restoring tissue flexibility helps normal motion to return to the joints. This allows healthier, less painful movement and  activity.  An effective stretch should be held for at least 30 seconds.  A stretch should never be painful. You should only feel a gentle lengthening or release in the stretched tissue.  RANGE OF MOTION - Toe Extension, Flexion  Sit with your right / left leg crossed over your opposite knee.  Grasp your toes and gently pull them back toward the top of your foot. You should feel a stretch on the bottom of your toes and/or foot.  Hold this stretch for 10 seconds.  Now, gently pull your toes toward the bottom of your foot. You should feel a stretch on the top of your toes and or foot.  Hold this stretch for 10 seconds. Repeat  times. Complete this stretch 3 times per day.   RANGE OF MOTION - Ankle Dorsiflexion, Active Assisted  Remove shoes and sit on a chair that is preferably not on a carpeted surface.  Place right / left foot under knee. Extend your opposite leg for support.  Keeping your heel down, slide your right / left foot back toward the chair until you feel a stretch at your ankle or calf. If you do not feel a stretch, slide your bottom forward to the edge of the chair, while still keeping your heel down.  Hold this stretch  for 10 seconds. Repeat 3 times. Complete this stretch 2 times per day.   STRETCH  Gastroc, Standing  Place hands on wall.  Extend right / left leg, keeping the front knee somewhat bent.  Slightly point your toes inward on your back foot.  Keeping your right / left heel on the floor and your knee straight, shift your weight toward the wall, not allowing your back to arch.  You should feel a gentle stretch in the right / left calf. Hold this position for 10 seconds. Repeat 3 times. Complete this stretch 2 times per day.  STRETCH  Soleus, Standing  Place hands on wall.  Extend right / left leg, keeping the other knee somewhat bent.  Slightly point your toes inward on your back foot.  Keep your right / left heel on the floor, bend your back  knee, and slightly shift your weight over the back leg so that you feel a gentle stretch deep in your back calf.  Hold this position for 10 seconds. Repeat 3 times. Complete this stretch 2 times per day.  STRETCH  Gastrocsoleus, Standing  Note: This exercise can place a lot of stress on your foot and ankle. Please complete this exercise only if specifically instructed by your caregiver.   Place the ball of your right / left foot on a step, keeping your other foot firmly on the same step.  Hold on to the wall or a rail for balance.  Slowly lift your other foot, allowing your body weight to press your heel down over the edge of the step.  You should feel a stretch in your right / left calf.  Hold this position for 10 seconds.  Repeat this exercise with a slight bend in your right / left knee. Repeat 3 times. Complete this stretch 2 times per day.   STRENGTHENING EXERCISES - Plantar Fasciitis (Heel Spur Syndrome)  These exercises may help you when beginning to rehabilitate your injury. They may resolve your symptoms with or without further involvement from your physician, physical therapist or athletic trainer. While completing these exercises, remember:   Muscles can gain both the endurance and the strength needed for everyday activities through controlled exercises.  Complete these exercises as instructed by your physician, physical therapist or athletic trainer. Progress the resistance and repetitions only as guided.  STRENGTH - Towel Curls  Sit in a chair positioned on a non-carpeted surface.  Place your foot on a towel, keeping your heel on the floor.  Pull the towel toward your heel by only curling your toes. Keep your heel on the floor. Repeat 3 times. Complete this exercise 2 times per day.  STRENGTH - Ankle Inversion  Secure one end of a rubber exercise band/tubing to a fixed object (table, pole). Loop the other end around your foot just before your toes.  Place your  fists between your knees. This will focus your strengthening at your ankle.  Slowly, pull your big toe up and in, making sure the band/tubing is positioned to resist the entire motion.  Hold this position for 10 seconds.  Have your muscles resist the band/tubing as it slowly pulls your foot back to the starting position. Repeat 3 times. Complete this exercises 2 times per day.  Document Released: 09/24/2005 Document Revised: 12/17/2011 Document Reviewed: 01/06/2009 [facility] Patient Information 2014 [place], [place].

## 2020-06-29 NOTE — Progress Notes (Signed)
Subjective: 59 year old female presents the office today for concerns of ingrown toenail left big toe which is causing discomfort.  The right side is been doing well.  She denies any redness or drainage to the tenderness on the left side.  Also she still is in pain of the right heel.  She points to the plantar medial aspect the heel where she gets discomfort.  No recent injury or falls and no increase in swelling.  No rating pain or weakness. Denies any systemic complaints such as fevers, chills, nausea, vomiting. No acute changes since last appointment, and no other complaints at this time.   Objective: AAO x3, NAD DP/PT pulses palpable bilaterally, CRT less than 3 seconds There is tenderness palpation on plantar medial tubercle of the calcaneus at the insertion of plantar fascial right side.  Negative Tinel sign.  No pain with Achilles tendon.  Plantar fascial, Achilles tendon appear to be intact.   Incurvation present to both the medial lateral aspect of the left hallux toenail with localized edema but there is no drainage or pus or ascending cellulitis.  No open lesions.   No pain with calf compression, swelling, warmth, erythema  Assessment: Left ingrown toenail; right plantar fasciitis  Plan: -All treatment options discussed with the patient including all alternatives, risks, complications.   1.  Ingrown toenail, left hallux -At this time, the patient is requesting partial nail removal with chemical matricectomy to the symptomatic portion of the nail. Risks and complications were discussed with the patient for which they understand and written consent was obtained. Under sterile conditions a total of 3 mL of a mixture of 2% lidocaine plain and 0.5% Marcaine plain was infiltrated in a hallux block fashion. Once anesthetized, the skin was prepped in sterile fashion. A tourniquet was then applied. Next the medial/lateral aspect of hallux nail border was then sharply excised making sure to remove  the entire offending nail border. Once the nails were ensured to be removed area was debrided and the underlying skin was intact. There is no purulence identified in the procedure. Next phenol was then applied under standard conditions and copiously irrigated. Silvadene was applied. A dry sterile dressing was applied. After application of the dressing the tourniquet was removed and there is found to be an immediate capillary refill time to the digit. The patient tolerated the procedure well any complications. Post procedure instructions were discussed the patient for which he verbally understood. Follow-up in one week for nail check or sooner if any problems are to arise. Discussed signs/symptoms of infection and directed to call the office immediately should any occur or go directly to the emergency room. In the meantime, encouraged to call the office with any questions, concerns, changes symptoms.   2.  Plantar fasciitis right heel -Steroid injection performed.  See procedure note below.  Continue stretching, icing daily as well as the orthotics.  Procedure: Injection Tendon/Ligament Discussed alternatives, risks, complications and verbal consent was obtained.  Location: Right plantar fascia at the glabrous junction; medial approach. Skin Prep: Alcohol  Injectate: 0.5cc 0.5% marcaine plain, 0.5 cc 2% lidocaine plain and, 1 cc kenalog 10. Disposition: Patient tolerated procedure well. Injection site dressed with a band-aid.  Post-injection care was discussed and return precautions discussed.   No follow-ups on file.  Trula Slade DPM

## 2020-07-11 ENCOUNTER — Ambulatory Visit (INDEPENDENT_AMBULATORY_CARE_PROVIDER_SITE_OTHER): Payer: 59 | Admitting: Podiatry

## 2020-07-11 ENCOUNTER — Other Ambulatory Visit: Payer: Self-pay

## 2020-07-11 DIAGNOSIS — M722 Plantar fascial fibromatosis: Secondary | ICD-10-CM | POA: Diagnosis not present

## 2020-07-11 DIAGNOSIS — L600 Ingrowing nail: Secondary | ICD-10-CM

## 2020-07-11 NOTE — Patient Instructions (Signed)
Soak Instructions    THE DAY AFTER THE PROCEDURE  Place 1/4 cup of epsom salts in a quart of warm tap water.  Submerge your foot or feet with outer bandage intact for the initial soak; this will allow the bandage to become moist and wet for easy lift off.  Once you remove your bandage, continue to soak in the solution for 20 minutes.  This soak should be done twice a day.  Next, remove your foot or feet from solution, blot dry the affected area and cover.  You may use a band aid large enough to cover the area or use gauze and tape.  Apply other medications to the area as directed by the doctor such as polysporin neosporin.  IF YOUR SKIN BECOMES IRRITATED WHILE USING THESE INSTRUCTIONS, IT IS OKAY TO SWITCH TO  WHITE VINEGAR AND WATER. Or you may use antibacterial soap and water to keep the toe clean  Monitor for any signs/symptoms of infection. Call the office immediately if any occur or go directly to the emergency room. Call with any questions/concerns.  Plantar Fasciitis (Heel Spur Syndrome) with Rehab The plantar fascia is a fibrous, ligament-like, soft-tissue structure that spans the bottom of the foot. Plantar fasciitis is a condition that causes pain in the foot due to inflammation of the tissue. SYMPTOMS  Pain and tenderness on the underneath side of the foot. Pain that worsens with standing or walking. CAUSES  Plantar fasciitis is caused by irritation and injury to the plantar fascia on the underneath side of the foot. Common mechanisms of injury include: Direct trauma to bottom of the foot. Damage to a small nerve that runs under the foot where the main fascia attaches to the heel bone. Stress placed on the plantar fascia due to bone spurs. RISK INCREASES WITH:  Activities that place stress on the plantar fascia (running, jumping, pivoting, or cutting). Poor strength and flexibility. Improperly fitted shoes. Tight calf muscles. Flat feet. Failure to warm-up properly before  activity. Obesity. PREVENTION Warm up and stretch properly before activity. Allow for adequate recovery between workouts. Maintain physical fitness: Strength, flexibility, and endurance. Cardiovascular fitness. Maintain a health body weight. Avoid stress on the plantar fascia. Wear properly fitted shoes, including arch supports for individuals who have flat feet.  PROGNOSIS  If treated properly, then the symptoms of plantar fasciitis usually resolve without surgery. However, occasionally surgery is necessary.  RELATED COMPLICATIONS  Recurrent symptoms that may result in a chronic condition. Problems of the lower back that are caused by compensating for the injury, such as limping. Pain or weakness of the foot during push-off following surgery. Chronic inflammation, scarring, and partial or complete fascia tear, occurring more often from repeated injections.  TREATMENT  Treatment initially involves the use of ice and medication to help reduce pain and inflammation. The use of strengthening and stretching exercises may help reduce pain with activity, especially stretches of the Achilles tendon. These exercises may be performed at home or with a therapist. Your caregiver may recommend that you use heel cups of arch supports to help reduce stress on the plantar fascia. Occasionally, corticosteroid injections are given to reduce inflammation. If symptoms persist for greater than 6 months despite non-surgical (conservative), then surgery may be recommended.   MEDICATION  If pain medication is necessary, then nonsteroidal anti-inflammatory medications, such as aspirin and ibuprofen, or other minor pain relievers, such as acetaminophen, are often recommended. Do not take pain medication within 7 days before surgery. Prescription pain relievers   may be given if deemed necessary by your caregiver. Use only as directed and only as much as you need. Corticosteroid injections may be given by your  caregiver. These injections should be reserved for the most serious cases, because they may only be given a certain number of times.  HEAT AND COLD Cold treatment (icing) relieves pain and reduces inflammation. Cold treatment should be applied for 10 to 15 minutes every 2 to 3 hours for inflammation and pain and immediately after any activity that aggravates your symptoms. Use ice packs or massage the area with a piece of ice (ice massage). Heat treatment may be used prior to performing the stretching and strengthening activities prescribed by your caregiver, physical therapist, or athletic trainer. Use a heat pack or soak the injury in warm water.  SEEK IMMEDIATE MEDICAL CARE IF: Treatment seems to offer no benefit, or the condition worsens. Any medications produce adverse side effects.  EXERCISES- RANGE OF MOTION (ROM) AND STRETCHING EXERCISES - Plantar Fasciitis (Heel Spur Syndrome) These exercises may help you when beginning to rehabilitate your injury. Your symptoms may resolve with or without further involvement from your physician, physical therapist or athletic trainer. While completing these exercises, remember:  Restoring tissue flexibility helps normal motion to return to the joints. This allows healthier, less painful movement and activity. An effective stretch should be held for at least 30 seconds. A stretch should never be painful. You should only feel a gentle lengthening or release in the stretched tissue.  RANGE OF MOTION - Toe Extension, Flexion Sit with your right / left leg crossed over your opposite knee. Grasp your toes and gently pull them back toward the top of your foot. You should feel a stretch on the bottom of your toes and/or foot. Hold this stretch for 10 seconds. Now, gently pull your toes toward the bottom of your foot. You should feel a stretch on the top of your toes and or foot. Hold this stretch for 10 seconds. Repeat  times. Complete this stretch 3 times  per day.   RANGE OF MOTION - Ankle Dorsiflexion, Active Assisted Remove shoes and sit on a chair that is preferably not on a carpeted surface. Place right / left foot under knee. Extend your opposite leg for support. Keeping your heel down, slide your right / left foot back toward the chair until you feel a stretch at your ankle or calf. If you do not feel a stretch, slide your bottom forward to the edge of the chair, while still keeping your heel down. Hold this stretch for 10 seconds. Repeat 3 times. Complete this stretch 2 times per day.   STRETCH  Gastroc, Standing Place hands on wall. Extend right / left leg, keeping the front knee somewhat bent. Slightly point your toes inward on your back foot. Keeping your right / left heel on the floor and your knee straight, shift your weight toward the wall, not allowing your back to arch. You should feel a gentle stretch in the right / left calf. Hold this position for 10 seconds. Repeat 3 times. Complete this stretch 2 times per day.  STRETCH  Soleus, Standing Place hands on wall. Extend right / left leg, keeping the other knee somewhat bent. Slightly point your toes inward on your back foot. Keep your right / left heel on the floor, bend your back knee, and slightly shift your weight over the back leg so that you feel a gentle stretch deep in your back calf. Hold   this position for 10 seconds. Repeat 3 times. Complete this stretch 2 times per day.  STRETCH  Gastrocsoleus, Standing  Note: This exercise can place a lot of stress on your foot and ankle. Please complete this exercise only if specifically instructed by your caregiver.  Place the ball of your right / left foot on a step, keeping your other foot firmly on the same step. Hold on to the wall or a rail for balance. Slowly lift your other foot, allowing your body weight to press your heel down over the edge of the step. You should feel a stretch in your right / left calf. Hold this  position for 10 seconds. Repeat this exercise with a slight bend in your right / left knee. Repeat 3 times. Complete this stretch 2 times per day.   STRENGTHENING EXERCISES - Plantar Fasciitis (Heel Spur Syndrome)  These exercises may help you when beginning to rehabilitate your injury. They may resolve your symptoms with or without further involvement from your physician, physical therapist or athletic trainer. While completing these exercises, remember:  Muscles can gain both the endurance and the strength needed for everyday activities through controlled exercises. Complete these exercises as instructed by your physician, physical therapist or athletic trainer. Progress the resistance and repetitions only as guided.  STRENGTH - Towel Curls Sit in a chair positioned on a non-carpeted surface. Place your foot on a towel, keeping your heel on the floor. Pull the towel toward your heel by only curling your toes. Keep your heel on the floor. Repeat 3 times. Complete this exercise 2 times per day.  STRENGTH - Ankle Inversion Secure one end of a rubber exercise band/tubing to a fixed object (table, pole). Loop the other end around your foot just before your toes. Place your fists between your knees. This will focus your strengthening at your ankle. Slowly, pull your big toe up and in, making sure the band/tubing is positioned to resist the entire motion. Hold this position for 10 seconds. Have your muscles resist the band/tubing as it slowly pulls your foot back to the starting position. Repeat 3 times. Complete this exercises 2 times per day.  Document Released: 09/24/2005 Document Revised: 12/17/2011 Document Reviewed: 01/06/2009 [employer] Patient Information 2014 [employer].  

## 2020-07-18 NOTE — Progress Notes (Signed)
Subjective: 59 year old female presents the office today for follow-up evaluation after having left hallux nail borders removed.  She states that she has been doing well denies any drainage or pus.  No swelling or redness or red streaks.  She is having pain to the heels on the right side.  This could be from compensation from the ingrown toenails.  She has been stretching, icing.  She has no other concerns today. Denies any systemic complaints such as fevers, chills, nausea, vomiting. No acute changes since last appointment, and no other complaints at this time.   Objective: AAO x3, NAD DP/PT pulses palpable bilaterally, CRT less than 3 seconds Status post left hallux partial nail avulsion both medial lateral nail borders there is no drainage or pus.  No edema, erythema or ascending cellulitis.  No significant pain.   Mild discomfort still to the plantar medial tubercle of the calcaneus at the insertion plantar fascial.  Plantar fascia appears to be intact.  No pain with lateral compression of calcaneus.  MMT special 5.   No pain with calf compression, swelling, warmth, erythema  Assessment: Status post partial nail avulsion wound healing well; plantar fasciitis  Plan: -All treatment options discussed with the patient including all alternatives, risks, complications.  -From an ingrown toenail standpoint she is doing well.  Continue soaking Epson salts covered antibiotic ointment and a dressing daily leave the area open at night. -I do think her heel pain still not resolved but she has been compensating for the ingrown toenails.  Continue stretching, icing, supportive shoes.  Consider another steroid injection next time if still having symptoms. -Patient encouraged to call the office with any questions, concerns, change in symptoms.   Trula Slade DPM

## 2020-08-22 ENCOUNTER — Other Ambulatory Visit: Payer: Self-pay

## 2020-08-22 ENCOUNTER — Ambulatory Visit: Payer: 59 | Admitting: Podiatry

## 2020-08-22 DIAGNOSIS — M722 Plantar fascial fibromatosis: Secondary | ICD-10-CM | POA: Diagnosis not present

## 2020-08-22 DIAGNOSIS — L600 Ingrowing nail: Secondary | ICD-10-CM

## 2020-08-30 ENCOUNTER — Ambulatory Visit (INDEPENDENT_AMBULATORY_CARE_PROVIDER_SITE_OTHER): Payer: 59 | Admitting: *Deleted

## 2020-08-30 ENCOUNTER — Other Ambulatory Visit: Payer: Self-pay

## 2020-08-30 DIAGNOSIS — M722 Plantar fascial fibromatosis: Secondary | ICD-10-CM

## 2020-08-30 DIAGNOSIS — B351 Tinea unguium: Secondary | ICD-10-CM

## 2020-08-30 NOTE — Patient Instructions (Signed)

## 2020-08-30 NOTE — Progress Notes (Signed)
Patient presents for the 1st EPAT treatment today with complaint of plantar heel pain right. Diagnosed with plantar fasciitis by Dr. Jacqualyn Posey. This has been ongoing for several months. The patient has tried ice, stretching, NSAIDS and supportive shoe gear with no long term relief.   Most of the pain is located plantar heel, edges of the heel and some posteriorly.  ESWT administered and tolerated well.Treatment settings initiated at:   Energy: 15  Ended treatment session today with 3000 shocks at the following settings:   Energy: 15  Frequency: 6.0  Joules: 14.72   Reviewed post EPAT instructions. Advised to avoid ice and NSAIDs throughout the treatment process and to utilize boot or supportive shoes for at least the next 3 days.  Follow up for 2nd treatment in 1 week.

## 2020-08-31 NOTE — Progress Notes (Signed)
Subjective: 59 year old female presents the office today for evaluation of right heel pain, plantar fasciitis.  She states that she still having quite a bit of discomfort at the heel.  She denies any recent injury or falls or changes otherwise.  She still been doing stretching, icing and will try to wear supportive shoes, inserts but still getting pain to her heel.  Regards to ingrown toenail she is doing well these are healing without any signs of infection.  Denies any systemic complaints such as fevers, chills, nausea, vomiting. No acute changes since last appointment, and no other complaints at this time.   Objective: AAO x3, NAD DP/PT pulses palpable bilaterally, CRT less than 3 seconds Status post ingrown toenail removal which are healed. On the right foot there is still continuation of tenderness along the plantar medial tubercle of the calcaneus at the insertion of the plantar fascia.  The plantar fascia appears to be intact.  There is no pain with lateral compression of calcaneus.  No edema, erythema.  MMT 5/5.  No pain with Achilles tendon. No pain with calf compression, swelling, warmth, erythema  Assessment: Right heel pain, plantar fasciitis; resolved ingrown toenail partial nail removals  Plan: -All treatment options discussed with the patient including all alternatives, risks, complications.  -Toenail sites are healed monitor for any reoccurrence. -Regards to the right heel pain she still in quite a bit of discomfort despite conservative treatment.  This time I discussed other treatment options as well and she elected to proceed with EPAT.  We will have her scheduled for this. -Patient encouraged to call the office with any questions, concerns, change in symptoms.   Trula Slade DPM

## 2020-09-09 ENCOUNTER — Ambulatory Visit (INDEPENDENT_AMBULATORY_CARE_PROVIDER_SITE_OTHER): Payer: 59 | Admitting: *Deleted

## 2020-09-09 ENCOUNTER — Other Ambulatory Visit: Payer: Self-pay

## 2020-09-09 DIAGNOSIS — B351 Tinea unguium: Secondary | ICD-10-CM

## 2020-09-09 DIAGNOSIS — M722 Plantar fascial fibromatosis: Secondary | ICD-10-CM

## 2020-09-09 NOTE — Progress Notes (Signed)
Patient presents for the 2nd EPAT treatment today with complaint of plantar heel pain right. Diagnosed with plantar fasciitis by Dr. Jacqualyn Posey. This has been ongoing for several months. The patient has tried ice, stretching, NSAIDS and supportive shoe gear with no long term relief.   Most of the pain is located plantar heel, edges of the heel and some posteriorly. Some better and able to walk more normal.  ESWT administered and tolerated well.Treatment settings initiated at:   Energy: 20  Ended treatment session today with 3000 shocks at the following settings:   Energy: 20  Frequency: 5.0  Joules: 19.62   Reviewed post EPAT instructions. Advised to avoid ice and NSAIDs throughout the treatment process and to utilize boot or supportive shoes for at least the next 3 days.  Follow up for 3rd treatment in 1 week.

## 2020-09-14 ENCOUNTER — Encounter (INDEPENDENT_AMBULATORY_CARE_PROVIDER_SITE_OTHER): Payer: Self-pay

## 2020-09-16 ENCOUNTER — Other Ambulatory Visit: Payer: Self-pay

## 2020-09-16 ENCOUNTER — Ambulatory Visit (INDEPENDENT_AMBULATORY_CARE_PROVIDER_SITE_OTHER): Payer: 59 | Admitting: *Deleted

## 2020-09-16 DIAGNOSIS — B351 Tinea unguium: Secondary | ICD-10-CM

## 2020-09-16 DIAGNOSIS — M722 Plantar fascial fibromatosis: Secondary | ICD-10-CM

## 2020-09-16 NOTE — Progress Notes (Signed)
Patient presents for the 3rd EPAT treatment today with complaint of plantar heel pain right. Diagnosed with plantar fasciitis by Dr. Jacqualyn Posey. This has been ongoing for several months. The patient has tried ice, stretching, NSAIDS and supportive shoe gear with no long term relief.   Most of the pain is located plantar heel only. She has improved a lot and is very happy is able to walk now through the day and not have pain.  ESWT administered and tolerated well.Treatment settings initiated at:   Energy: 25  Ended treatment session today with 3000 shocks at the following settings:   Energy: 25  Frequency: 4.0  Joules: 24.52   Reviewed post EPAT instructions. Advised to avoid ice and NSAIDs throughout the treatment process and to utilize boot or supportive shoes for at least the next 3 days.  Follow up for 4th treatment in 4 weeks due to holiday schedule. She will cancel this appointment if she feels she doesn't need it.

## 2020-10-11 ENCOUNTER — Other Ambulatory Visit: Payer: Self-pay | Admitting: Family Medicine

## 2020-10-11 ENCOUNTER — Other Ambulatory Visit: Payer: Self-pay

## 2020-10-11 ENCOUNTER — Ambulatory Visit
Admission: RE | Admit: 2020-10-11 | Discharge: 2020-10-11 | Disposition: A | Discharge status: Home / Self Care | Payer: 59 | Source: Ambulatory Visit | Attending: Family Medicine | Admitting: Family Medicine

## 2020-10-11 DIAGNOSIS — R079 Chest pain, unspecified: Secondary | ICD-10-CM

## 2020-10-11 DIAGNOSIS — R1310 Dysphagia, unspecified: Secondary | ICD-10-CM

## 2020-10-11 DIAGNOSIS — R12 Heartburn: Secondary | ICD-10-CM

## 2020-10-13 ENCOUNTER — Encounter (INDEPENDENT_AMBULATORY_CARE_PROVIDER_SITE_OTHER): Payer: Self-pay | Admitting: Family Medicine

## 2020-10-13 ENCOUNTER — Ambulatory Visit (INDEPENDENT_AMBULATORY_CARE_PROVIDER_SITE_OTHER): Payer: 59 | Admitting: Family Medicine

## 2020-10-13 ENCOUNTER — Other Ambulatory Visit: Payer: Self-pay

## 2020-10-13 VITALS — BP 102/64 | HR 62 | Temp 97.7°F | Ht 64.0 in | Wt 185.0 lb

## 2020-10-13 DIAGNOSIS — E7849 Other hyperlipidemia: Secondary | ICD-10-CM | POA: Diagnosis not present

## 2020-10-13 DIAGNOSIS — E559 Vitamin D deficiency, unspecified: Secondary | ICD-10-CM | POA: Diagnosis not present

## 2020-10-13 DIAGNOSIS — R0602 Shortness of breath: Secondary | ICD-10-CM

## 2020-10-13 DIAGNOSIS — G4733 Obstructive sleep apnea (adult) (pediatric): Secondary | ICD-10-CM | POA: Diagnosis not present

## 2020-10-13 DIAGNOSIS — Z9189 Other specified personal risk factors, not elsewhere classified: Secondary | ICD-10-CM | POA: Diagnosis not present

## 2020-10-13 DIAGNOSIS — R5383 Other fatigue: Secondary | ICD-10-CM | POA: Diagnosis not present

## 2020-10-13 DIAGNOSIS — Z6831 Body mass index (BMI) 31.0-31.9, adult: Secondary | ICD-10-CM

## 2020-10-13 DIAGNOSIS — K219 Gastro-esophageal reflux disease without esophagitis: Secondary | ICD-10-CM

## 2020-10-13 DIAGNOSIS — Z9989 Dependence on other enabling machines and devices: Secondary | ICD-10-CM

## 2020-10-13 DIAGNOSIS — Z0289 Encounter for other administrative examinations: Secondary | ICD-10-CM

## 2020-10-13 DIAGNOSIS — F39 Unspecified mood [affective] disorder: Secondary | ICD-10-CM

## 2020-10-13 DIAGNOSIS — E669 Obesity, unspecified: Secondary | ICD-10-CM

## 2020-10-13 HISTORY — DX: Shortness of breath: R06.02

## 2020-10-14 LAB — COMPREHENSIVE METABOLIC PANEL
ALT: 25 IU/L (ref 0–32)
AST: 22 IU/L (ref 0–40)
Albumin/Globulin Ratio: 2 (ref 1.2–2.2)
Albumin: 4.7 g/dL (ref 3.8–4.9)
Alkaline Phosphatase: 61 IU/L (ref 44–121)
BUN/Creatinine Ratio: 16 (ref 9–23)
BUN: 10 mg/dL (ref 6–24)
Bilirubin Total: 0.5 mg/dL (ref 0.0–1.2)
CO2: 25 mmol/L (ref 20–29)
Calcium: 9.8 mg/dL (ref 8.7–10.2)
Chloride: 106 mmol/L (ref 96–106)
Creatinine, Ser: 0.64 mg/dL (ref 0.57–1.00)
GFR calc Af Amer: 115 mL/min/{1.73_m2} (ref >59)
GFR calc non Af Amer: 99 mL/min/{1.73_m2} (ref >59)
Globulin, Total: 2.3 g/dL (ref 1.5–4.5)
Glucose: 90 mg/dL (ref 65–99)
Potassium: 4.5 mmol/L (ref 3.5–5.2)
Sodium: 142 mmol/L (ref 134–144)
Total Protein: 7 g/dL (ref 6.0–8.5)

## 2020-10-14 LAB — LIPID PANEL
Chol/HDL Ratio: 2.5 ratio (ref 0.0–4.4)
Cholesterol, Total: 159 mg/dL (ref 100–199)
HDL: 63 mg/dL (ref >39)
LDL Chol Calc (NIH): 75 mg/dL (ref 0–99)
Triglycerides: 116 mg/dL (ref 0–149)
VLDL Cholesterol Cal: 21 mg/dL (ref 5–40)

## 2020-10-14 LAB — CBC WITH DIFFERENTIAL/PLATELET
Basophils Absolute: 0 10*3/uL (ref 0.0–0.2)
Basos: 1 %
EOS (ABSOLUTE): 0.1 10*3/uL (ref 0.0–0.4)
Eos: 1 %
Hematocrit: 43.5 % (ref 34.0–46.6)
Hemoglobin: 14.7 g/dL (ref 11.1–15.9)
Immature Grans (Abs): 0 10*3/uL (ref 0.0–0.1)
Immature Granulocytes: 0 %
Lymphocytes Absolute: 1.8 10*3/uL (ref 0.7–3.1)
Lymphs: 33 %
MCH: 29.2 pg (ref 26.6–33.0)
MCHC: 33.8 g/dL (ref 31.5–35.7)
MCV: 86 fL (ref 79–97)
Monocytes Absolute: 0.5 10*3/uL (ref 0.1–0.9)
Monocytes: 8 %
Neutrophils Absolute: 3.1 10*3/uL (ref 1.4–7.0)
Neutrophils: 57 %
Platelets: 308 10*3/uL (ref 150–450)
RBC: 5.04 x10E6/uL (ref 3.77–5.28)
RDW: 12.2 % (ref 11.7–15.4)
WBC: 5.4 10*3/uL (ref 3.4–10.8)

## 2020-10-14 LAB — VITAMIN D 25 HYDROXY (VIT D DEFICIENCY, FRACTURES): Vit D, 25-Hydroxy: 13 ng/mL — ABNORMAL LOW (ref 30.0–100.0)

## 2020-10-14 LAB — TSH: TSH: 1.07 u[IU]/mL (ref 0.450–4.500)

## 2020-10-14 LAB — VITAMIN B12: Vitamin B-12: 240 pg/mL (ref 232–1245)

## 2020-10-14 LAB — INSULIN, RANDOM: INSULIN: 11.4 u[IU]/mL (ref 2.6–24.9)

## 2020-10-14 LAB — HEMOGLOBIN A1C
Est. average glucose Bld gHb Est-mCnc: 120 mg/dL
Hgb A1c MFr Bld: 5.8 % — ABNORMAL HIGH (ref 4.8–5.6)

## 2020-10-14 LAB — T4, FREE: Free T4: 1.06 ng/dL (ref 0.82–1.77)

## 2020-10-14 LAB — FOLATE: Folate: 11.8 ng/mL (ref >3.0)

## 2020-10-14 LAB — T3: T3, Total: 102 ng/dL (ref 71–180)

## 2020-10-17 ENCOUNTER — Other Ambulatory Visit: Payer: 59

## 2020-10-17 NOTE — Progress Notes (Signed)
Chief Complaint:   OBESITY Lisa Wade (MR# [id number]) is a 60 y.o. female who presents for evaluation and treatment of obesity and related comorbidities. Current BMI is Body mass index is 31.76 kg/m. Lisa Wade has been struggling with her weight for many years and has been unsuccessful in either losing weight, maintaining weight loss, or reaching her healthy weight goal.  Lisa Wade is currently in the action stage of change and ready to dedicate time achieving and maintaining a healthier weight. Lisa Wade is interested in becoming our patient and working on intensive lifestyle modifications including (but not limited to) diet and exercise for weight loss.  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   Her husband, Lisa Wade, is 73 years old.   He is joining the program also today.   Lisa Wade craves sweets and salty crunchy, especially evening snacking.   Lisa Wade's habits were reviewed today and are as follows: Her family eats meals together, she thinks her family will eat healthier with her, she struggles with family and or coworkers weight loss sabotage, her desired weight loss is 45 lbs, she started gaining weight when the pandemic hit, her heaviest weight ever was 190 pounds, she has significant food cravings issues, she snacks frequently in the evenings, she skips meals frequently, she is frequently drinking liquids with calories, she frequently makes poor food choices, she frequently eats larger portions than normal and she struggles with emotional eating.  This is the patient's first visit at [facility].  The patient's NEW PATIENT PACKET that they filled out prior to today's office visit was reviewed at length and information from that paperwork was included within the following office visit note.    Included in the packet: current and past health history, medications, allergies, ROS, gynecologic history (women only), surgical history, family history, social history, weight history, weight loss  surgery history (for those that have had weight loss surgery), nutritional evaluation, mood and food questionnaire along with a depression screening (PHQ9) on all patients, an Epworth questionnaire, sleep habits questionnaire, patient life and health improvement goals questionnaire. These will all be scanned into the patient's chart under media.   During the visit, I independently reviewed the patient's EKG, bioimpedance scale results, and indirect calorimeter results. I used this information to tailor a meal plan for the patient that will help Lisa Wade to lose weight and will improve her obesity-related conditions going forward.  I performed a medically necessary appropriate examination and/or evaluation. I discussed the assessment and treatment plan with the patient. The patient was provided an opportunity to ask questions and all were answered. The patient agreed with the plan and demonstrated an understanding of the instructions. Labs were ordered today (unless patient declined them) and will be reviewed with the patient at our next visit unless more critical results need to be addressed immediately. Clinical information was updated and documented in the EMR.  Time spent on visit including pre-visit chart review and post-visit care was estimated to be 50 minutes.  A separate 15 minutes was spent on risk counseling (see above/below).   Depression Screen [name]'s Food and Mood (modified PHQ-9) score was 14.  Depression screen PHQ 2/9 10/13/2020  Decreased Interest 2  Down, Depressed, Hopeless 1  PHQ - 2 Score 3  Altered sleeping 1  Tired, decreased energy 3  Change in appetite 2  Feeling bad or failure about yourself  1  Trouble concentrating 3  Moving slowly or fidgety/restless 1  Suicidal thoughts 0  PHQ-9 Score  14  Difficult doing work/chores Somewhat difficult    Assessment/Plan:   1. SOBOE (shortness of breath on exertion) Brena notes increasing shortness of breath with exercising and seems  to be worsening over time with weight gain. She notes getting out of breath sooner with activity than she used to. This has gotten worse recently however this is not acute. Chadsity denies shortness of breath at rest or orthopnea.  Plan: Dusty does feel that she gets out of breath more easily that she used to when she exercises. [name]'s shortness of breath appears to be obesity related and exercise induced. She has agreed to work on weight loss and gradually increase exercise to treat her exercise induced shortness of breath. Will continue to monitor closely. Check labs today.  Orders - Vitamin B12 - CBC with Differential/Platelet - Comprehensive metabolic panel - Folate - Hemoglobin A1c - Insulin, random - Lipid panel - T3 - T4, free - TSH - VITAMIN D 25 Hydroxy (Vit-D Deficiency, Fractures)   2. Fatigue, unspecified type Lisa Wade admits to daytime somnolence and denies waking up still tired. Patent has a history of symptoms of daytime fatigue. [name] generally gets 8 hours of sleep per night, and states that she has good sleep quality. Snoring is present. Apneic episodes are present. Epworth Sleepiness Score is 14; has history of OSA.  Plan: Lisa Wade does feel that her weight is causing her energy to be lower than it should be. Fatigue may be related to obesity, depression or many other causes. Labs will be ordered, and in the meanwhile, Lisa Wade will focus on self care including making healthy food choices, increasing physical activity and focusing on stress reduction.  Check labs today.  Orders - Vitamin B12 - CBC with Differential/Platelet - Comprehensive metabolic panel - Folate - Hemoglobin A1c - Insulin, random - Lipid panel - T3 - T4, free - TSH - VITAMIN D 25 Hydroxy (Vit-D Deficiency, Fractures)   3. Other hyperlipidemia Lisa Wade is prescribed Crestor 10 mg. Lisa Wade has hyperlipidemia and has been trying to improve her cholesterol levels with intensive lifestyle modification including a low  saturated fat diet, exercise and weight loss. She denies any chest pain, claudication or myalgias.  Plan: Manage per PCP.  Check labs today.  Practice prudent nutritional plan that is low in saturated and trans fats, exercise eventually. Cardiovascular risk and specific lipid/LDL goals reviewed.  We discussed several lifestyle modifications today and Cherese will continue to work on diet, exercise and weight loss efforts. Orders and follow up as documented in patient record.   Counseling Intensive lifestyle modifications are the first line treatment for this issue. . Dietary changes: Increase soluble fiber. Decrease simple carbohydrates. . Exercise changes: Moderate to vigorous-intensity aerobic activity 150 minutes per week if tolerated. . Lipid-lowering medications: see documented in medical record.   Orders - Vitamin B12 - CBC with Differential/Platelet - Comprehensive metabolic panel - Folate - Hemoglobin A1c - Insulin, random - Lipid panel - T3 - T4, free - TSH - VITAMIN D 25 Hydroxy (Vit-D Deficiency, Fractures)   4. OSA on CPAP  Rethel has a diagnosis of sleep apnea. She reports that she is using a CPAP regularly. She has a ESS score of 14.  Plan:  Check labs today. Continue CPAP use at night.  I counseled patient on the importance of treatment.  Intensive lifestyle modifications are the first line treatment for this issue.  We discussed several lifestyle modifications today and she will continue to work on diet, exercise and weight  loss efforts.  We will continue to monitor.  Orders and follow up as documented in patient record.   Orders - Vitamin B12 - CBC with Differential/Platelet - Comprehensive metabolic panel - Folate - Hemoglobin A1c - Insulin, random - Lipid panel - T3 - T4, free - TSH - VITAMIN D 25 Hydroxy (Vit-D Deficiency, Fractures)   5. Vitamin D deficiency Allyson is currently taking no vitamin D supplement. She denies nausea, vomiting or muscle  weakness.  Plan: Check labs today. Low Vitamin D level contributes to fatigue and are associated with obesity, breast, and colon cancer. She agrees to continue to take prescription Vitamin D @50 ,000 IU every week and will follow-up for routine testing of Vitamin D, at least 2-3 times per year to avoid over-replacement.  Orders - Vitamin B12 - CBC with Differential/Platelet - Comprehensive metabolic panel - Folate - Hemoglobin A1c - Insulin, random - Lipid panel - T3 - T4, free - TSH - VITAMIN D 25 Hydroxy (Vit-D Deficiency, Fractures)   6. Gastroesophageal reflux disease, unspecified whether esophagitis present Zella Ball takes omeprazole daily. She has been experiencing heartburn whenever she is not taking the medicine.   Plan: Robin's symptoms are stable and she denies issues. Intensive lifestyle modifications are the first line treatment for this issue. discussed several lifestyle modifications today and she will continue to work on diet, exercise and weight loss efforts. Orders and follow up as documented in patient record.   Counseling . If a person has gastroesophageal reflux disease (GERD), food and stomach acid move back up into the esophagus and cause symptoms or problems such as damage to the esophagus. . Anti-reflux measures include: raising the head of the bed, avoiding tight clothing or belts, avoiding eating late at night, not lying down shortly after mealtime, and achieving weight loss. . Avoid ASA, NSAID's, caffeine, alcohol, and tobacco.  . OTC Pepcid and/or Tums are often very helpful for as needed use.  Marland Kitchen However, for persisting chronic or daily symptoms, stronger medications like Omeprazole may be needed. . You may need to avoid foods and drinks such as: ? Coffee and tea (with or without caffeine). ? Drinks that contain alcohol. ? Energy drinks and sports drinks. ? Bubbly (carbonated) drinks or sodas. ? Chocolate and cocoa. ? Peppermint and mint flavorings. ? Garlic  and onions. ? Horseradish. ? Spicy and acidic foods. These include peppers, chili powder, curry powder, vinegar, hot sauces, and BBQ sauce. ? Citrus fruit juices and citrus fruits, such as oranges, lemons, and limes. ? Tomato-based foods. These include red sauce, chili, salsa, and pizza with red sauce. ? Fried and fatty foods. These include donuts, french fries, potato chips, and high-fat dressings. ? High-fat meats. These include hot dogs, rib eye steak, sausage, ham, and bacon.  Orders - Vitamin B12 - CBC with Differential/Platelet - Comprehensive metabolic panel - Folate - Hemoglobin A1c - Insulin, random - Lipid panel - T3 - T4, free - TSH - VITAMIN D 25 Hydroxy (Vit-D Deficiency, Fractures)  7. Mood disorder (HCC), with emotional eating Timeka is positive for depression, anxiety, and emotional eating. She has a PHQ score of 14.  However, she feels her mood is well controlled currently and does not feel the need for a counselor. She is prescribed Lexapro and Xanax.  She denies that she needs a medication adjustment as well.   Plan: Continue current treatment plan per PCP. Her symptoms are stable. We will monitor.  Orders  - Vitamin B12 - CBC with Differential/Platelet - Comprehensive  metabolic panel - Folate - Hemoglobin A1c - Insulin, random - Lipid panel - T3 - T4, free - TSH - VITAMIN D 25 Hydroxy (Vit-D Deficiency, Fractures)  8. At risk for heart disease Erma was given approximately 30 minutes of coronary artery disease prevention counseling today. She is 60 y.o. female and has risk factors for heart disease including obesity. We discussed intensive lifestyle modifications today with an emphasis on specific weight loss instructions and strategies.   9. Class 1 obesity with serious comorbidity and body mass index (BMI) of 31.0 to 31.9 in adult, unspecified obesity type Chellie is currently in the action stage of change and her goal is to continue with weight loss  efforts. I recommend Gisell begin the structured treatment plan as follows:  She has agreed to the Category 2 Plan.  Exercise goals: As is   Behavioral modification strategies: increasing lean protein intake, decreasing simple carbohydrates, decreasing liquid calories, no skipping meals, meal planning and cooking strategies and planning for success.  She was informed of the importance of frequent follow-up visits to maximize her success with intensive lifestyle modifications for her multiple health conditions. She was informed we would discuss her lab results at her next visit unless there is a critical issue that needs to be addressed sooner. Lateshia agreed to keep her next visit at the agreed upon time to discuss these results.  Objective:   Blood pressure 102/64, pulse 62, temperature 97.7 F (36.5 C), height 5\' 4"  (1.626 m), weight 185 lb (83.9 kg), last menstrual period 05/12/2020, SpO2 96 %. Body mass index is 31.76 kg/m.  Indirect Calorimeter completed today shows a VO2 of 256 and a REE of 1781.  Her calculated basal metabolic rate is 1610 thus her basal metabolic rate is better than expected.  General: Cooperative, alert, well developed, in no acute distress. HEENT: Conjunctivae and lids unremarkable. Cardiovascular: Regular rhythm.  Lungs: Normal work of breathing. Neurologic: No focal deficits.   Lab Results  Component Value Date   CREATININE 0.64 10/13/2020   BUN 10 10/13/2020   NA 142 10/13/2020   K 4.5 10/13/2020   CL 106 10/13/2020   CO2 25 10/13/2020   Lab Results  Component Value Date   ALT 25 10/13/2020   AST 22 10/13/2020   ALKPHOS 61 10/13/2020   BILITOT 0.5 10/13/2020   Lab Results  Component Value Date   HGBA1C 5.8 (H) 10/13/2020   Lab Results  Component Value Date   INSULIN 11.4 10/13/2020   Lab Results  Component Value Date   TSH 1.070 10/13/2020   Lab Results  Component Value Date   CHOL 159 10/13/2020   HDL 63 10/13/2020   LDLCALC 75  10/13/2020   TRIG 116 10/13/2020   CHOLHDL 2.5 10/13/2020   Lab Results  Component Value Date   WBC 5.4 10/13/2020   HGB 14.7 10/13/2020   HCT 43.5 10/13/2020   MCV 86 10/13/2020   PLT 308 10/13/2020   Attestation Statements:   Reviewed by clinician on day of visit: allergies, medications, problem list, medical history, surgical history, family history, social history, and previous encounter notes.   Edmund Hilda, am acting as Energy manager for [employer], DO.  I have reviewed the above documentation for accuracy and completeness, and I agree with the above. Carlye Grippe, D.O.  The 21st Century Cures Act was signed into law in 2016 which includes the topic of electronic health records.  This provides immediate access to information in  MyChart.  This includes consultation notes, operative notes, office notes, lab results and pathology reports.  If you have any questions about what you read please let us know at your next visit so we can discuss your concerns and take corrective action if need be.  We are right here with you.

## 2020-10-21 ENCOUNTER — Ambulatory Visit (INDEPENDENT_AMBULATORY_CARE_PROVIDER_SITE_OTHER): Payer: Self-pay | Admitting: *Deleted

## 2020-10-21 ENCOUNTER — Other Ambulatory Visit: Payer: Self-pay

## 2020-10-21 DIAGNOSIS — M722 Plantar fascial fibromatosis: Secondary | ICD-10-CM

## 2020-10-21 DIAGNOSIS — B351 Tinea unguium: Secondary | ICD-10-CM

## 2020-10-21 NOTE — Progress Notes (Signed)
Patient presents for the 4th EPAT treatment today with complaint of plantar heel pain right. Diagnosed with plantar fasciitis by Dr. Jacqualyn Posey. This has been ongoing for several months. The patient has tried ice, stretching, NSAIDS and supportive shoe gear with no long term relief.   Most of the pain is located plantar heel only. She is happy she is able to be up on her feet more often throughout the day and not have much pain.  ESWT administered and tolerated well.Treatment settings initiated at:   Energy: 30  Ended treatment session today with 3000 shocks at the following settings:   Energy: 30  Frequency: 4.0  Joules: 29.43   Reviewed post EPAT instructions. Advised to avoid ice and NSAIDs throughout the treatment process and to utilize boot or supportive shoes for at least the next 3 days.  Follow up in 2 weeks to see Dr. Jacqualyn Posey for re-evaluation.

## 2020-10-27 ENCOUNTER — Ambulatory Visit (INDEPENDENT_AMBULATORY_CARE_PROVIDER_SITE_OTHER): Payer: 59 | Admitting: Family Medicine

## 2020-10-27 ENCOUNTER — Other Ambulatory Visit: Payer: Self-pay

## 2020-10-27 ENCOUNTER — Encounter (INDEPENDENT_AMBULATORY_CARE_PROVIDER_SITE_OTHER): Payer: Self-pay | Admitting: Family Medicine

## 2020-10-27 VITALS — BP 102/69 | HR 83 | Temp 97.7°F | Ht 64.0 in | Wt 178.0 lb

## 2020-10-27 DIAGNOSIS — Z9189 Other specified personal risk factors, not elsewhere classified: Secondary | ICD-10-CM | POA: Diagnosis not present

## 2020-10-27 DIAGNOSIS — E559 Vitamin D deficiency, unspecified: Secondary | ICD-10-CM | POA: Diagnosis not present

## 2020-10-27 DIAGNOSIS — E7849 Other hyperlipidemia: Secondary | ICD-10-CM | POA: Diagnosis not present

## 2020-10-27 DIAGNOSIS — R7303 Prediabetes: Secondary | ICD-10-CM

## 2020-10-27 DIAGNOSIS — F39 Unspecified mood [affective] disorder: Secondary | ICD-10-CM

## 2020-10-27 DIAGNOSIS — E66811 Obesity, class 1: Secondary | ICD-10-CM

## 2020-10-27 DIAGNOSIS — E669 Obesity, unspecified: Secondary | ICD-10-CM

## 2020-10-27 DIAGNOSIS — Z6830 Body mass index (BMI) 30.0-30.9, adult: Secondary | ICD-10-CM

## 2020-10-27 MED ORDER — VITAMIN D (ERGOCALCIFEROL) 1.25 MG (50000 UNIT) PO CAPS: 50000.0000 [IU] | ORAL_CAPSULE | ORAL | 0 refills | Status: DC

## 2020-10-31 ENCOUNTER — Ambulatory Visit
Admission: RE | Admit: 2020-10-31 | Discharge: 2020-10-31 | Disposition: A | Discharge status: Home / Self Care | Payer: 59 | Source: Ambulatory Visit | Attending: Family Medicine | Admitting: Family Medicine

## 2020-10-31 DIAGNOSIS — R1310 Dysphagia, unspecified: Secondary | ICD-10-CM

## 2020-10-31 DIAGNOSIS — R12 Heartburn: Secondary | ICD-10-CM

## 2020-10-31 NOTE — Progress Notes (Signed)
Chief Complaint:   OBESITY Lisa Wade is here to discuss her progress with her obesity treatment plan along with follow-up of her obesity related diagnoses. Lisa Wade is on the Category 2 Plan and states she is following her eating plan approximately 95% of the time. Lisa Wade states she is exercising 0 minutes 0 times per week.  Today's visit was #: 2 Starting weight: 185 lbs Starting date: 10/13/2020 Today's weight: 178 lbs Today's date: 10/27/2020 Total lbs lost to date: 7 lbs Total lbs lost since last in-office visit: 7 lbs Total weight loss percentage to date: -3.78%   Interim History:  Lisa Wade is here today to review her NEW Meal Plan and to discuss all recent labs done here and/ or done at outside facilities. This is patient's first follow up visit. Extended time was spent counseling [name] on all new disease processes that were discovered or that are worsening.   Pt denies difficulty following the meal plan. Her husband Lisa Wade started the program at the same time. Going well with no issues. Denies hunger or cravings.   Assessment/Plan:   1. Vitamin D deficiency Discussed labs with patient today. Lisa Wade's Vitamin D level was 13.0 on 10/13/2020. She is currently taking OTC vitamin D each day. She denies nausea, vomiting or muscle weakness.  Ref. Range 10/13/2020 10:56  Vitamin D, 25-Hydroxy Latest Ref Range: 30.0 - 100.0 ng/mL 13.0 (L)   Plan: - Discussed importance of vitamin D to their health and well-being.  - possible symptoms of low Vitamin D can be low energy, depressed mood, muscle aches, joint aches, osteoporosis etc. - low Vitamin D levels may be linked to an increased risk of cardiovascular events and even increased risk of cancers- such as colon and breast.  - I recommend pt take a weekly prescription vit D - see script below   - Informed patient this may be a lifelong thing, and she was encouraged to continue to take the medicine until told otherwise.   - we will need  to monitor levels regularly (every 3-4 mo on average) to keep levels within normal limits.  - weight loss will likely improve availability of vitamin D, thus encouraged Chloeann to continue with meal plan and their weight loss efforts to further improve this condition - pt's questions and concerns regarding this condition addressed.  Start- Vitamin D, Ergocalciferol, (DRISDOL) 1.25 MG (50000 UNIT) CAPS capsule; Take 1 capsule (50,000 Units total) by mouth every 7 (seven) days.  Dispense: 4 capsule; Refill: 0  2. Mood disorder ([place]), with emotional eating  Discussed labs with patient today. Mood is well controlled. She reports mood is actually better now that she is eating healthy and taking better care of herself. She is taking Lexapro daily and rarely uses Xanax.   Plan: Stable and doing quite well. Continue meds. We will continue to closely mmonitor.  3. Other hyperlipidemia Discussed labs with patient today. Pt is taking Crestor. [name] has hyperlipidemia and has been trying to improve her cholesterol levels with intensive lifestyle modification including a low saturated fat diet, exercise and weight loss. She denies any chest pain, claudication or myalgias.  Lab Results  Component Value Date   ALT 25 10/13/2020   AST 22 10/13/2020   ALKPHOS 61 10/13/2020   BILITOT 0.5 10/13/2020   Lab Results  Component Value Date   CHOL 159 10/13/2020   HDL 63 10/13/2020   LDLCALC 75 10/13/2020   TRIG 116 10/13/2020   CHOLHDL 2.5  10/13/2020   Plan: Well controlled with current meds. Eventually increase exercise and continue low saturated and trans fats diet. Cardiovascular risk and specific lipid/LDL goals reviewed.  We discussed several lifestyle modifications today and Lisa Wade will continue to work on diet, exercise and weight loss efforts. Orders and follow up as documented in patient record.   Counseling Intensive lifestyle modifications are the first line treatment for this issue. . Dietary  changes: Increase soluble fiber. Decrease simple carbohydrates. . Exercise changes: Moderate to vigorous-intensity aerobic activity 150 minutes per week if tolerated. . Lipid-lowering medications: see documented in medical record.   4. Pre-diabetes New. Discussed labs with patient today. Pt has never been diagnosed with pre-diabetes or insulin resistance. A1c 5.8 and elevated insulin level. She has never been on meds in the past for this condition. Historically, admits to a carb-rich diet. [name] has a diagnosis of prediabetes based on her elevated HgA1c and was informed this puts her at greater risk of developing diabetes. She continues to work on diet and exercise to decrease her risk of diabetes. She denies nausea or hypoglycemia.  Lab Results  Component Value Date   HGBA1C 5.8 (H) 10/13/2020   Lab Results  Component Value Date   INSULIN 11.4 10/13/2020   Plan: Extensive counseling/education done. Handout on pre-diabetes given. Recommend following meal plan and decreasing simple carbs, weight loss, and increase exercise. Consider meds in the future.  5. At risk for diabetes mellitus - Lisa Wade was given extensive diabetes prevention education and counseling today of more than 24 minutes.  - Counseled patient on pathophysiology of disease and discussed various treatment options which always includes dietary and lifestyle modification as first line.   - Importance of healthy diet with very limited amounts of simple carbohydrates discussed with patient in addition to regular aerobic exercise to an eventual goal of 24min 5d/week or more.  - Handouts provided at patient's desire and or told to go online at the American Diabetes Association website for further information  6. Class 1 obesity with serious comorbidity and body mass index (BMI) of 30.0 to 30.9 in adult, unspecified obesity type [name] is currently in the action stage of change. As such, her goal is to continue with weight loss efforts.  She has agreed to the Category 2 Plan.   Exercise goals: As is  Behavioral modification strategies: meal planning and cooking strategies, keeping healthy foods in the home, ways to avoid boredom eating, better snacking choices, avoiding temptations and planning for success.  Athea has agreed to follow-up with our clinic in 2 weeks. She was informed of the importance of frequent follow-up visits to maximize her success with intensive lifestyle modifications for her multiple health conditions.   Objective:   Blood pressure 102/69, pulse 83, temperature 97.7 F (36.5 C), height 5\' 4"  (1.626 m), weight 178 lb (80.7 kg), last menstrual period 05/12/2020, SpO2 95 %. Body mass index is 30.55 kg/m.  General: Cooperative, alert, well developed, in no acute distress. HEENT: Conjunctivae and lids unremarkable. Cardiovascular: Regular rhythm.  Lungs: Normal work of breathing. Neurologic: No focal deficits.   Lab Results  Component Value Date   CREATININE 0.64 10/13/2020   BUN 10 10/13/2020   NA 142 10/13/2020   K 4.5 10/13/2020   CL 106 10/13/2020   CO2 25 10/13/2020   Lab Results  Component Value Date   ALT 25 10/13/2020   AST 22 10/13/2020   ALKPHOS 61 10/13/2020   BILITOT 0.5 10/13/2020   Lab Results  Component Value Date   HGBA1C 5.8 (H) 10/13/2020   Lab Results  Component Value Date   INSULIN 11.4 10/13/2020   Lab Results  Component Value Date   TSH 1.070 10/13/2020   Lab Results  Component Value Date   CHOL 159 10/13/2020   HDL 63 10/13/2020   LDLCALC 75 10/13/2020   TRIG 116 10/13/2020   CHOLHDL 2.5 10/13/2020   Lab Results  Component Value Date   WBC 5.4 10/13/2020   HGB 14.7 10/13/2020   HCT 43.5 10/13/2020   MCV 86 10/13/2020   PLT 308 10/13/2020   No results found for: IRON, TIBC, FERRITIN  Attestation Statements:   Reviewed by clinician on day of visit: allergies, medications, problem list, medical history, surgical history, family history, social  history, and previous encounter notes.  Coral Ceo, am acting as Location manager for [employer], DO.  I have reviewed the above documentation for accuracy and completeness, and I agree with the above. Marjory Sneddon, D.O.  The [place] was signed into law in 2016 which includes the topic of electronic health records.  This provides immediate access to information in MyChart.  This includes consultation notes, operative notes, office notes, lab results and pathology reports.  If you have any questions about what you read please let us know at your next visit so we can discuss your concerns and take corrective action if need be.  We are right here with you.

## 2020-11-07 ENCOUNTER — Other Ambulatory Visit: Payer: Self-pay

## 2020-11-07 ENCOUNTER — Ambulatory Visit: Payer: 59 | Admitting: Podiatry

## 2020-11-07 DIAGNOSIS — M722 Plantar fascial fibromatosis: Secondary | ICD-10-CM | POA: Diagnosis not present

## 2020-11-07 NOTE — Progress Notes (Signed)
Subjective: 60 year old female presents the office today for follow-up evaluation of right heel pain, plantar fasciitis.  Overall states that she is doing much better not having any significant discomfort.  She continues to get tightness.  No recent injury or trauma.  She feels that the EPAT was helpful.  Denies any systemic complaints such as fevers, chills, nausea, vomiting. No acute changes since last appointment, and no other complaints at this time.   Objective: AAO x3, NAD DP/PT pulses palpable bilaterally, CRT less than 3 seconds There is minimal tenderness palpation of plantar medial tubercle of the calcaneus with insertion of plantar fascia.  The plantar fascia appears to be intact.  No erythema point tenderness.  No edema, erythema.  No pain with Achilles tendon.  No pain with calf compression, swelling, warmth, erythema  Assessment: Resolving right heel pain, plantar fasciitis  Plan: -All treatment options discussed with the patient including all alternatives, risks, complications.  -She has not been consistent with the stretching recommend continue stretching, rehab exercises.  Continue with supportive shoes, inserts.  If symptoms recur we can consider another steroid injection but at this point doing much better.  I will see her back as needed. -Patient encouraged to call the office with any questions, concerns, change in symptoms.   Trula Slade DPM

## 2020-11-07 NOTE — Patient Instructions (Signed)

## 2020-11-09 ENCOUNTER — Other Ambulatory Visit: Payer: Self-pay

## 2020-11-09 ENCOUNTER — Encounter (INDEPENDENT_AMBULATORY_CARE_PROVIDER_SITE_OTHER): Payer: Self-pay | Admitting: Family Medicine

## 2020-11-09 ENCOUNTER — Ambulatory Visit (INDEPENDENT_AMBULATORY_CARE_PROVIDER_SITE_OTHER): Payer: 59 | Admitting: Family Medicine

## 2020-11-09 VITALS — BP 108/71 | HR 79 | Temp 97.7°F | Ht 64.0 in | Wt 179.0 lb

## 2020-11-09 DIAGNOSIS — Z6830 Body mass index (BMI) 30.0-30.9, adult: Secondary | ICD-10-CM

## 2020-11-09 DIAGNOSIS — Z9189 Other specified personal risk factors, not elsewhere classified: Secondary | ICD-10-CM | POA: Diagnosis not present

## 2020-11-09 DIAGNOSIS — E669 Obesity, unspecified: Secondary | ICD-10-CM | POA: Diagnosis not present

## 2020-11-09 DIAGNOSIS — E559 Vitamin D deficiency, unspecified: Secondary | ICD-10-CM | POA: Diagnosis not present

## 2020-11-09 MED ORDER — VITAMIN D (ERGOCALCIFEROL) 1.25 MG (50000 UNIT) PO CAPS: 50000.0000 [IU] | ORAL_CAPSULE | ORAL | 0 refills | Status: DC

## 2020-11-14 NOTE — Progress Notes (Signed)
Chief Complaint:   OBESITY Lisa Wade is here to discuss her progress with her obesity treatment plan along with follow-up of her obesity related diagnoses.   Today's visit was #: 3 Starting weight: 185 lbs Starting date: 10/13/2020 Today's weight: 179 lbs Today's date: 11/09/2020 Total lbs lost to date: 8 lbs Body mass index is 30.73 kg/m.  Total weight loss percentage to date: -3.24%  Interim History: Lisa Wade says she is bored with breakfast, but likes the meal plan.  Nutrition Plan: Category 2 for 80% of the time. Activity: None at this time. This patient is following the prescribed meal plan meal without concerns.  Food recall appears to be consistent with the prescribed plan.  When following the plan, hunger and cravings are well controlled.    Assessment/Plan:   No orders of the defined types were placed in this encounter.  Medications Discontinued During This Encounter  Medication Reason  . Vitamin D, Ergocalciferol, (DRISDOL) 1.25 MG (50000 UNIT) CAPS capsule Reorder    Meds ordered this encounter  Medications  . Vitamin D, Ergocalciferol, (DRISDOL) 1.25 MG (50000 UNIT) CAPS capsule    Sig: Take 1 capsule (50,000 Units total) by mouth every 7 (seven) days.    Dispense:  4 capsule    Refill:  0      1. Vitamin D deficiency Not at goal. Current vitamin D is 13.0, tested on 10/13/2020. Optimal goal > 50 ng/dL.   Plan: Continue to take prescription Vitamin D @50 ,000 IU every week as prescribed.  Follow-up for routine testing of Vitamin D, at least 2-3 times per year to avoid over-replacement.  - Refill Vitamin D, Ergocalciferol, (DRISDOL) 1.25 MG (50000 UNIT) CAPS capsule; Take 1 capsule (50,000 Units total) by mouth every 7 (seven) days.  Dispense: 4 capsule; Refill: 0  2. At risk for malnutrition Lisa Wade was given extensive malnutrition prevention education and counseling today of more than 9 minutes.  Counseled her that malnutrition refers to inappropriate nutrients or not  the right balance of nutrients for optimal health.  Discussed with Lisa Wade that it is absolutely possible to be malnourished but yet obese.  Risk factors, including but not limited to, inappropriate dietary choices, difficulty with obtaining food due to physical or financial limitations, and various physical and mental health conditions were reviewed with Lisa Wade.   3. Class 1 obesity with serious comorbidity and body mass index (BMI) of 30.0 to 30.9 in adult, unspecified obesity type  Course: Lisa Wade is currently in the action stage of change. As such, her goal is to continue with weight loss efforts.   Nutrition goals: She has agreed to the Category 2 Plan with breakfast options.   Exercise goals: For substantial health benefits, adults should do at least 150 minutes (2 hours and 30 minutes) a week of moderate-intensity, or 75 minutes (1 hour and 15 minutes) a week of vigorous-intensity aerobic physical activity, or an equivalent combination of moderate- and vigorous-intensity aerobic activity. Aerobic activity should be performed in episodes of at least 10 minutes, and preferably, it should be spread throughout the week.  Behavioral modification strategies: meal planning and cooking strategies, keeping healthy foods in the home and planning for success.  Lisa Wade has agreed to follow-up with our clinic in 2 weeks. She was informed of the importance of frequent follow-up visits to maximize her success with intensive lifestyle modifications for her multiple health conditions.   Objective:   Blood pressure 108/71, pulse 79, temperature 97.7 F (36.5  C), height 5\' 4"  (1.626 m), weight 179 lb (81.2 kg), last menstrual period 05/12/2020, SpO2 96 %. Body mass index is 30.73 kg/m.  General: Cooperative, alert, well developed, in no acute distress. HEENT: Conjunctivae and lids unremarkable. Cardiovascular: Regular rhythm.  Lungs: Normal work of breathing. Neurologic: No focal deficits.    Lab Results  Component Value Date   CREATININE 0.64 10/13/2020   BUN 10 10/13/2020   NA 142 10/13/2020   K 4.5 10/13/2020   CL 106 10/13/2020   CO2 25 10/13/2020   Lab Results  Component Value Date   ALT 25 10/13/2020   AST 22 10/13/2020   ALKPHOS 61 10/13/2020   BILITOT 0.5 10/13/2020   Lab Results  Component Value Date   HGBA1C 5.8 (H) 10/13/2020   Lab Results  Component Value Date   INSULIN 11.4 10/13/2020   Lab Results  Component Value Date   TSH 1.070 10/13/2020   Lab Results  Component Value Date   CHOL 159 10/13/2020   HDL 63 10/13/2020   LDLCALC 75 10/13/2020   TRIG 116 10/13/2020   CHOLHDL 2.5 10/13/2020   Lab Results  Component Value Date   WBC 5.4 10/13/2020   HGB 14.7 10/13/2020   HCT 43.5 10/13/2020   MCV 86 10/13/2020   PLT 308 10/13/2020   Attestation Statements:   Reviewed by clinician on day of visit: allergies, medications, problem list, medical history, surgical history, family history, social history, and previous encounter notes.  I, Water quality scientist, CMA, am acting as Location manager for [employer], DO.  I have reviewed the above documentation for accuracy and completeness, and I agree with the above. Marjory Sneddon, D.O.  The [place] was signed into law in 2016 which includes the topic of electronic health records.  This provides immediate access to information in MyChart.  This includes consultation notes, operative notes, office notes, lab results and pathology reports.  If you have any questions about what you read please let us know at your next visit so we can discuss your concerns and take corrective action if need be.  We are right here with you.

## 2020-11-21 ENCOUNTER — Ambulatory Visit (INDEPENDENT_AMBULATORY_CARE_PROVIDER_SITE_OTHER): Payer: 59 | Admitting: Physician Assistant

## 2020-11-21 ENCOUNTER — Ambulatory Visit (INDEPENDENT_AMBULATORY_CARE_PROVIDER_SITE_OTHER): Payer: 59 | Admitting: Family Medicine

## 2020-11-21 ENCOUNTER — Other Ambulatory Visit: Payer: Self-pay

## 2020-11-21 VITALS — BP 102/62 | HR 65 | Temp 97.7°F | Ht 64.0 in | Wt 179.0 lb

## 2020-11-21 DIAGNOSIS — E7849 Other hyperlipidemia: Secondary | ICD-10-CM | POA: Diagnosis not present

## 2020-11-21 DIAGNOSIS — E559 Vitamin D deficiency, unspecified: Secondary | ICD-10-CM

## 2020-11-21 DIAGNOSIS — E669 Obesity, unspecified: Secondary | ICD-10-CM

## 2020-11-21 DIAGNOSIS — Z6830 Body mass index (BMI) 30.0-30.9, adult: Secondary | ICD-10-CM | POA: Diagnosis not present

## 2020-11-21 DIAGNOSIS — Z9189 Other specified personal risk factors, not elsewhere classified: Secondary | ICD-10-CM | POA: Diagnosis not present

## 2020-11-21 MED ORDER — VITAMIN D (ERGOCALCIFEROL) 1.25 MG (50000 UNIT) PO CAPS: 50000.0000 [IU] | ORAL_CAPSULE | ORAL | 0 refills | Status: DC

## 2020-11-22 NOTE — Progress Notes (Unsigned)
Chief Complaint:   OBESITY Lisa Wade is here to discuss her progress with her obesity treatment plan along with follow-up of her obesity related diagnoses. Lisa Wade is on the Category 2 Plan with breakfast options and states she is following her eating plan approximately 80% of the time. Lisa Wade states she is walking and dance videos for 30 minutes 1-2 times per week.  Today's visit was #: 4 Starting weight: 185 lbs Starting date: 10/13/2020 Today's weight: 179 lbs Today's date: 11/21/2020 Total lbs lost to date: 6 Total lbs lost since last in-office visit: 0  Interim History: Lisa Wade is eating 1 boiled egg with Special K protein with Fair life milk for breakfast most days. She is struggling to eat all of the meat on her sandwich at lunch. She is eating peanuts for snacks but she is not portion controlling them.  Subjective:   1. Vitamin D deficiency Neeta is on Vit D. Last Vit D level was at goal.  2. Other hyperlipidemia Maliya is on Crestor, and she is managed by her primary care physician. She denies myalgias or chest pain.  3. At risk for heart disease Nyima is at a higher than average risk for cardiovascular disease due to obesity.   Assessment/Plan:   1. Vitamin D deficiency Low Vitamin D level contributes to fatigue and are associated with obesity, breast, and colon cancer. We will refill prescription Vitamin D for 1 month. Lisa Wade will follow-up for routine testing of Vitamin D, at least 2-3 times per year to avoid over-replacement.  - Vitamin D, Ergocalciferol, (DRISDOL) 1.25 MG (50000 UNIT) CAPS capsule; Take 1 capsule (50,000 Units total) by mouth every 7 (seven) days.  Dispense: 4 capsule; Refill: 0  2. Other hyperlipidemia Cardiovascular risk and specific lipid/LDL goals reviewed. We discussed several lifestyle modifications today. Lisa Wade will continue her medications and will follow up with her primary care physician. She will continue to work on diet, exercise and weight loss efforts.  Orders and follow up as documented in patient record.   Counseling Intensive lifestyle modifications are the first line treatment for this issue. . Dietary changes: Increase soluble fiber. Decrease simple carbohydrates. . Exercise changes: Moderate to vigorous-intensity aerobic activity 150 minutes per week if tolerated. . Lipid-lowering medications: see documented in medical record.  3. At risk for heart disease Sarajean was given approximately 15 minutes of coronary artery disease prevention counseling today. She is 60 y.o. female and has risk factors for heart disease including obesity. We discussed intensive lifestyle modifications today with an emphasis on specific weight loss instructions and strategies.   Repetitive spaced learning was employed today to elicit superior memory formation and behavioral change.  4. Class 1 obesity with serious comorbidity and body mass index (BMI) of 30.0 to 30.9 in adult, unspecified obesity type Lisa Wade is currently in the action stage of change. As such, her goal is to continue with weight loss efforts. She has agreed to the Category 2 Plan.   Lisa Wade will substitute cottage cheese for 2 oz of meat at lunch. She is to consider giving 100 extra snack calories if she is still hungry.  Exercise goals: As is.  Behavioral modification strategies: increasing lean protein intake and meal planning and cooking strategies.  Lisa Wade has agreed to follow-up with our clinic in 2 weeks. She was informed of the importance of frequent follow-up visits to maximize her success with intensive lifestyle modifications for her multiple health conditions.   Objective:   Blood pressure 102/62, pulse 65,  temperature 97.7 F (36.5 C), height 5\' 4"  (1.626 m), weight 179 lb (81.2 kg), last menstrual period 05/12/2020, SpO2 97 %. Body mass index is 30.73 kg/m.  General: Cooperative, alert, well developed, in no acute distress. HEENT: Conjunctivae and lids unremarkable. Cardiovascular:  Regular rhythm.  Lungs: Normal work of breathing. Neurologic: No focal deficits.   Lab Results  Component Value Date   CREATININE 0.64 10/13/2020   BUN 10 10/13/2020   NA 142 10/13/2020   K 4.5 10/13/2020   CL 106 10/13/2020   CO2 25 10/13/2020   Lab Results  Component Value Date   ALT 25 10/13/2020   AST 22 10/13/2020   ALKPHOS 61 10/13/2020   BILITOT 0.5 10/13/2020   Lab Results  Component Value Date   HGBA1C 5.8 (H) 10/13/2020   Lab Results  Component Value Date   INSULIN 11.4 10/13/2020   Lab Results  Component Value Date   TSH 1.070 10/13/2020   Lab Results  Component Value Date   CHOL 159 10/13/2020   HDL 63 10/13/2020   LDLCALC 75 10/13/2020   TRIG 116 10/13/2020   CHOLHDL 2.5 10/13/2020   Lab Results  Component Value Date   WBC 5.4 10/13/2020   HGB 14.7 10/13/2020   HCT 43.5 10/13/2020   MCV 86 10/13/2020   PLT 308 10/13/2020   No results found for: IRON, TIBC, FERRITIN  Attestation Statements:   Reviewed by clinician on day of visit: allergies, medications, problem list, medical history, surgical history, family history, social history, and previous encounter notes.   Wilhemena Durie, am acting as transcriptionist for [employer], PA-C.  I have reviewed the above documentation for accuracy and completeness, and I agree with the above. Abby Potash, PA-C

## 2020-12-19 ENCOUNTER — Ambulatory Visit (INDEPENDENT_AMBULATORY_CARE_PROVIDER_SITE_OTHER): Payer: 59 | Admitting: Family Medicine

## 2020-12-19 ENCOUNTER — Other Ambulatory Visit: Payer: Self-pay

## 2020-12-19 ENCOUNTER — Encounter (INDEPENDENT_AMBULATORY_CARE_PROVIDER_SITE_OTHER): Payer: Self-pay | Admitting: Family Medicine

## 2020-12-19 VITALS — BP 105/64 | HR 76 | Temp 97.8°F | Ht 64.0 in | Wt 173.0 lb

## 2020-12-19 DIAGNOSIS — K219 Gastro-esophageal reflux disease without esophagitis: Secondary | ICD-10-CM | POA: Diagnosis not present

## 2020-12-19 DIAGNOSIS — E669 Obesity, unspecified: Secondary | ICD-10-CM

## 2020-12-19 DIAGNOSIS — Z9189 Other specified personal risk factors, not elsewhere classified: Secondary | ICD-10-CM

## 2020-12-19 DIAGNOSIS — Z6830 Body mass index (BMI) 30.0-30.9, adult: Secondary | ICD-10-CM

## 2020-12-19 DIAGNOSIS — R7303 Prediabetes: Secondary | ICD-10-CM | POA: Diagnosis not present

## 2020-12-19 DIAGNOSIS — E559 Vitamin D deficiency, unspecified: Secondary | ICD-10-CM | POA: Diagnosis not present

## 2020-12-19 MED ORDER — VITAMIN D (ERGOCALCIFEROL) 1.25 MG (50000 UNIT) PO CAPS: 50000.0000 [IU] | ORAL_CAPSULE | ORAL | 0 refills | Status: DC

## 2020-12-26 NOTE — Progress Notes (Signed)
Chief Complaint:   OBESITY Lisa Wade is here to discuss her progress with her obesity treatment plan along with follow-up of her obesity related diagnoses.   Today's visit was #: 5 Starting weight: 185 lbs Starting date: 10/13/2020 Today's weight: 173 lbs Today's date: 12/19/2020 Total lbs lost to date: 12 lbs Body mass index is 29.7 kg/m.  Total weight loss percentage to date: -6.49%  Interim History:  [name] just came back from vacation in [place].  She increased her protein intake and made an effort to eat more of the food on her plan.  Her last office visit was 4 weeks ago.  Current Meal Plan: the Category 2 Plan for 75% of the time.  Current Exercise Plan: None.  Assessment/Plan:   1. Vitamin D deficiency Not at goal. Current vitamin D is 13.0, tested on 10/13/2020. Optimal goal > 50 ng/dL.  She is taking vitamin D 50,000 IU weekly.  Plan: Continue to take prescription Vitamin D @50 ,000 IU every week as prescribed.  Will check vitamin D level at next visit.  - Refill Vitamin D, Ergocalciferol, (DRISDOL) 1.25 MG (50000 UNIT) CAPS capsule; Take 1 capsule (50,000 Units total) by mouth every 7 (seven) days.  Dispense: 4 capsule; Refill: 0 - VITAMIN D 25 Hydroxy (Vit-D Deficiency, Fractures)  2. Gastroesophageal reflux disease, unspecified whether esophagitis present Rosell is taking Prilosec 20 mg twice daily.  She had a flare of symptoms while on vacation and forgot to take her Prilosec twice a day.  She recently had a barium swallow study and it showed a small hernia.  Otherwise normal.  Plan:  Importance of medication adherence discussed today.  Avoid triggers.  Follow-up with PCP if symptoms persist despite treatment.  We reviewed the diagnosis of GERD and importance of treatment. We discussed "red flag" symptoms and the importance of follow up if symptoms persisted despite treatment. We reviewed non-pharmacologic management of GERD symptoms: including: caffeine reduction, dietary  changes, elevate HOB, NPO after supper, reduction of alcohol intake, tobacco cessation, and weight loss.  3. Prediabetes Improving, but not optimized. Goal is HgbA1c < 5.7.  Medication: None.    Plan:  She will continue to focus on protein-rich, low simple carbohydrate foods. We reviewed the importance of hydration, regular exercise for stress reduction, and restorative sleep.  Will check A1c at next visit.  Lab Results  Component Value Date   HGBA1C 5.8 (H) 10/13/2020   Lab Results  Component Value Date   INSULIN 11.4 10/13/2020   - Hemoglobin A1c  4. At risk for deficient intake of food [name] was given extensive education and counseling today of more than 8 minutes on risks associated with deficient food intake.  Counseled her on the importance of following our prescribed meal plan and eating adequate amounts of protein.  Discussed with JEANNIFER DRAKEFORD that inadequate food intake over longer periods of time can slow their metabolism down significantly.   5. Class 1 obesity with serious comorbidity and body mass index (BMI) of 30.0 to 30.9 in adult, unspecified obesity type  Course: [name] is currently in the action stage of change. As such, her goal is to continue with weight loss efforts.   Nutrition goals: She has agreed to the Category 2 Plan.   Exercise goals: All adults should avoid inactivity. Some physical activity is better than none, and adults who participate in any amount of physical activity gain some health benefits.  Behavioral modification strategies: increasing lean protein intake, decreasing simple carbohydrates,  increasing water intake and planning for success.  Verity has agreed to follow-up with our clinic in 4 weeks.  Will obtain vitamin D and A1c then.  She was informed of the importance of frequent follow-up visits to maximize her success with intensive lifestyle modifications for her multiple health conditions.   Objective:   Blood pressure 105/64, pulse 76,  temperature 97.8 F (36.6 C), height 5\' 4"  (1.626 m), weight 173 lb (78.5 kg), last menstrual period 05/12/2020, SpO2 97 %. Body mass index is 29.7 kg/m.  General: Cooperative, alert, well developed, in no acute distress. HEENT: Conjunctivae and lids unremarkable. Cardiovascular: Regular rhythm.  Lungs: Normal work of breathing. Neurologic: No focal deficits.   Lab Results  Component Value Date   CREATININE 0.64 10/13/2020   BUN 10 10/13/2020   NA 142 10/13/2020   K 4.5 10/13/2020   CL 106 10/13/2020   CO2 25 10/13/2020   Lab Results  Component Value Date   ALT 25 10/13/2020   AST 22 10/13/2020   ALKPHOS 61 10/13/2020   BILITOT 0.5 10/13/2020   Lab Results  Component Value Date   HGBA1C 5.8 (H) 10/13/2020   Lab Results  Component Value Date   INSULIN 11.4 10/13/2020   Lab Results  Component Value Date   TSH 1.070 10/13/2020   Lab Results  Component Value Date   CHOL 159 10/13/2020   HDL 63 10/13/2020   LDLCALC 75 10/13/2020   TRIG 116 10/13/2020   CHOLHDL 2.5 10/13/2020   Lab Results  Component Value Date   WBC 5.4 10/13/2020   HGB 14.7 10/13/2020   HCT 43.5 10/13/2020   MCV 86 10/13/2020   PLT 308 10/13/2020   Attestation Statements:   Reviewed by clinician on day of visit: allergies, medications, problem list, medical history, surgical history, family history, social history, and previous encounter notes.  I, Water quality scientist, CMA, am acting as Location manager for [employer], DO.  I have reviewed the above documentation for accuracy and completeness, and I agree with the above. Marjory Sneddon, D.O.  The [place] was signed into law in 2016 which includes the topic of electronic health records.  This provides immediate access to information in MyChart.  This includes consultation notes, operative notes, office notes, lab results and pathology reports.  If you have any questions about what you read please let us know at your next  visit so we can discuss your concerns and take corrective action if need be.  We are right here with you.

## 2021-01-16 ENCOUNTER — Other Ambulatory Visit: Payer: Self-pay

## 2021-01-16 ENCOUNTER — Encounter (INDEPENDENT_AMBULATORY_CARE_PROVIDER_SITE_OTHER): Payer: Self-pay | Admitting: Bariatrics

## 2021-01-16 ENCOUNTER — Ambulatory Visit (INDEPENDENT_AMBULATORY_CARE_PROVIDER_SITE_OTHER): Payer: 59 | Admitting: Bariatrics

## 2021-01-16 VITALS — BP 99/65 | HR 77 | Temp 97.9°F | Ht 64.0 in | Wt 173.0 lb

## 2021-01-16 DIAGNOSIS — E7849 Other hyperlipidemia: Secondary | ICD-10-CM

## 2021-01-16 DIAGNOSIS — Z6831 Body mass index (BMI) 31.0-31.9, adult: Secondary | ICD-10-CM | POA: Diagnosis not present

## 2021-01-16 DIAGNOSIS — K219 Gastro-esophageal reflux disease without esophagitis: Secondary | ICD-10-CM

## 2021-01-16 DIAGNOSIS — E669 Obesity, unspecified: Secondary | ICD-10-CM

## 2021-01-18 ENCOUNTER — Encounter (INDEPENDENT_AMBULATORY_CARE_PROVIDER_SITE_OTHER): Payer: Self-pay | Admitting: Bariatrics

## 2021-01-18 NOTE — Progress Notes (Signed)
Chief Complaint:   OBESITY Lisa Wade is here to discuss her progress with her obesity treatment plan along with follow-up of her obesity related diagnoses. Lisa Wade is on the Category 2 Plan and states she is following her eating plan approximately 50% of the time. Tammi states she is not exercising regularly at this time.  Today's visit was #: 6 Starting weight: 185 lbs Starting date: 10/13/2020 Today's weight: 173 lbs Today's date: 01/16/2021 Total lbs lost to date: 12 lbs Total lbs lost since last in-office visit: 0  Interim History: Lisa Wade weight remains the same.  She has been sick and had a greater appetite.  She struggles with getting enough water.  Subjective:   1. Gastroesophageal reflux disease, unspecified whether esophagitis present She is taking Prilosec.  2. Other hyperlipidemia Lisa Wade has hyperlipidemia and has been trying to improve her cholesterol levels with intensive lifestyle modification including a low saturated fat diet, exercise and weight loss. She denies any chest pain, claudication or myalgias.  Taking Crestor.  Lab Results  Component Value Date   ALT 25 10/13/2020   AST 22 10/13/2020   ALKPHOS 61 10/13/2020   BILITOT 0.5 10/13/2020   Lab Results  Component Value Date   CHOL 159 10/13/2020   HDL 63 10/13/2020   LDLCALC 75 10/13/2020   TRIG 116 10/13/2020   CHOLHDL 2.5 10/13/2020   Assessment/Plan:   1. Gastroesophageal reflux disease, unspecified whether esophagitis present Intensive lifestyle modifications are the first line treatment for this issue. We discussed several lifestyle modifications today and she will continue to work on diet, exercise and weight loss efforts. Orders and follow up as documented in patient record.  Continue Prilosec.  Counseling . If a person has gastroesophageal reflux disease (GERD), food and stomach acid move back up into the esophagus and cause symptoms or problems such as damage to the esophagus. . Anti-reflux measures  include: raising the head of the bed, avoiding tight clothing or belts, avoiding eating late at night, not lying down shortly after mealtime, and achieving weight loss. . Avoid ASA, NSAID's, caffeine, alcohol, and tobacco.  . OTC Pepcid and/or Tums are often very helpful for as needed use.  Marland Kitchen However, for persisting chronic or daily symptoms, stronger medications like Omeprazole may be needed. . You may need to avoid foods and drinks such as: ? Coffee and tea (with or without caffeine). ? Drinks that contain alcohol. ? Energy drinks and sports drinks. ? Bubbly (carbonated) drinks or sodas. ? Chocolate and cocoa. ? Peppermint and mint flavorings. ? Garlic and onions. ? Horseradish. ? Spicy and acidic foods. These include peppers, chili powder, curry powder, vinegar, hot sauces, and BBQ sauce. ? Citrus fruit juices and citrus fruits, such as oranges, lemons, and limes. ? Tomato-based foods. These include red sauce, chili, salsa, and pizza with red sauce. ? Fried and fatty foods. These include donuts, french fries, potato chips, and high-fat dressings. ? High-fat meats. These include hot dogs, rib eye steak, sausage, ham, and bacon.  2. Other hyperlipidemia Cardiovascular risk and specific lipid/LDL goals reviewed.  We discussed several lifestyle modifications today and Lisa Wade will continue to work on diet, exercise and weight loss efforts. Orders and follow up as documented in patient record.  Continue Crestor.  Counseling Intensive lifestyle modifications are the first line treatment for this issue. . Dietary changes: Increase soluble fiber. Decrease simple carbohydrates. . Exercise changes: Moderate to vigorous-intensity aerobic activity 150 minutes per week if tolerated. . Lipid-lowering medications: see documented  in medical record.  3. Obesity, current BMI 31  Lisa Wade is currently in the action stage of change. As such, her goal is to continue with weight loss efforts. She has agreed to  the Category 2 Plan.   She will work on adhering closely to the plan.  Mindful eating.  Increasing her water intake (will use a bottle and carry) and using a water app.  Exercise goals: All adults should avoid inactivity. Some physical activity is better than none, and adults who participate in any amount of physical activity gain some health benefits.  Behavioral modification strategies: increasing lean protein intake, decreasing simple carbohydrates, increasing vegetables, increasing water intake, decreasing eating out, no skipping meals, meal planning and cooking strategies, keeping healthy foods in the home and planning for success.  Lisa Wade has agreed to follow-up with our clinic in 2 weeks with Dr. Raliegh Scarlet or Abby Potash, PA-C. She was informed of the importance of frequent follow-up visits to maximize her success with intensive lifestyle modifications for her multiple health conditions.   Objective:   Blood pressure 99/65, pulse 77, temperature 97.9 F (36.6 C), height 5\' 4"  (1.626 m), weight 173 lb (78.5 kg), last menstrual period 05/12/2020, SpO2 94 %. Body mass index is 29.7 kg/m.  General: Cooperative, alert, well developed, in no acute distress. HEENT: Conjunctivae and lids unremarkable. Cardiovascular: Regular rhythm.  Lungs: Normal work of breathing. Neurologic: No focal deficits.   Lab Results  Component Value Date   CREATININE 0.64 10/13/2020   BUN 10 10/13/2020   NA 142 10/13/2020   K 4.5 10/13/2020   CL 106 10/13/2020   CO2 25 10/13/2020   Lab Results  Component Value Date   ALT 25 10/13/2020   AST 22 10/13/2020   ALKPHOS 61 10/13/2020   BILITOT 0.5 10/13/2020   Lab Results  Component Value Date   HGBA1C 5.8 (H) 10/13/2020   Lab Results  Component Value Date   INSULIN 11.4 10/13/2020   Lab Results  Component Value Date   TSH 1.070 10/13/2020   Lab Results  Component Value Date   CHOL 159 10/13/2020   HDL 63 10/13/2020   LDLCALC 75 10/13/2020    TRIG 116 10/13/2020   CHOLHDL 2.5 10/13/2020   Lab Results  Component Value Date   WBC 5.4 10/13/2020   HGB 14.7 10/13/2020   HCT 43.5 10/13/2020   MCV 86 10/13/2020   PLT 308 10/13/2020   Attestation Statements:   Reviewed by clinician on day of visit: allergies, medications, problem list, medical history, surgical history, family history, social history, and previous encounter notes.  I, Water quality scientist, CMA, am acting as Location manager for [employer], DO  I have reviewed the above documentation for accuracy and completeness, and I agree with the above. Jearld Lesch, DO

## 2021-02-06 ENCOUNTER — Ambulatory Visit (INDEPENDENT_AMBULATORY_CARE_PROVIDER_SITE_OTHER): Payer: 59 | Admitting: Physician Assistant

## 2021-02-06 ENCOUNTER — Other Ambulatory Visit: Payer: Self-pay

## 2021-02-06 ENCOUNTER — Encounter (INDEPENDENT_AMBULATORY_CARE_PROVIDER_SITE_OTHER): Payer: Self-pay | Admitting: Physician Assistant

## 2021-02-06 VITALS — BP 110/70 | HR 73 | Temp 98.2°F | Ht 64.0 in | Wt 173.0 lb

## 2021-02-06 DIAGNOSIS — E559 Vitamin D deficiency, unspecified: Secondary | ICD-10-CM

## 2021-02-06 DIAGNOSIS — Z6830 Body mass index (BMI) 30.0-30.9, adult: Secondary | ICD-10-CM

## 2021-02-06 DIAGNOSIS — R0602 Shortness of breath: Secondary | ICD-10-CM

## 2021-02-06 DIAGNOSIS — E7849 Other hyperlipidemia: Secondary | ICD-10-CM | POA: Diagnosis not present

## 2021-02-06 DIAGNOSIS — R7303 Prediabetes: Secondary | ICD-10-CM

## 2021-02-06 DIAGNOSIS — Z9189 Other specified personal risk factors, not elsewhere classified: Secondary | ICD-10-CM | POA: Diagnosis not present

## 2021-02-06 DIAGNOSIS — E669 Obesity, unspecified: Secondary | ICD-10-CM

## 2021-02-06 MED ORDER — VITAMIN D (ERGOCALCIFEROL) 1.25 MG (50000 UNIT) PO CAPS: 50000.0000 [IU] | ORAL_CAPSULE | ORAL | 0 refills | Status: DC

## 2021-02-07 LAB — COMPREHENSIVE METABOLIC PANEL
ALT: 23 IU/L (ref 0–32)
AST: 19 IU/L (ref 0–40)
Albumin/Globulin Ratio: 2 (ref 1.2–2.2)
Albumin: 4.6 g/dL (ref 3.8–4.9)
Alkaline Phosphatase: 66 IU/L (ref 44–121)
BUN/Creatinine Ratio: 20 (ref 9–23)
BUN: 14 mg/dL (ref 6–24)
Bilirubin Total: 0.6 mg/dL (ref 0.0–1.2)
CO2: 22 mmol/L (ref 20–29)
Calcium: 9.6 mg/dL (ref 8.7–10.2)
Chloride: 109 mmol/L — ABNORMAL HIGH (ref 96–106)
Creatinine, Ser: 0.7 mg/dL (ref 0.57–1.00)
Globulin, Total: 2.3 g/dL (ref 1.5–4.5)
Glucose: 97 mg/dL (ref 65–99)
Potassium: 4.8 mmol/L (ref 3.5–5.2)
Sodium: 147 mmol/L — ABNORMAL HIGH (ref 134–144)
Total Protein: 6.9 g/dL (ref 6.0–8.5)
eGFR: 101 mL/min/{1.73_m2} (ref >59)

## 2021-02-07 LAB — HEMOGLOBIN A1C
Est. average glucose Bld gHb Est-mCnc: 117 mg/dL
Hgb A1c MFr Bld: 5.7 % — ABNORMAL HIGH (ref 4.8–5.6)

## 2021-02-07 LAB — LIPID PANEL
Chol/HDL Ratio: 2.9 ratio (ref 0.0–4.4)
Cholesterol, Total: 177 mg/dL (ref 100–199)
HDL: 61 mg/dL (ref >39)
LDL Chol Calc (NIH): 95 mg/dL (ref 0–99)
Triglycerides: 120 mg/dL (ref 0–149)
VLDL Cholesterol Cal: 21 mg/dL (ref 5–40)

## 2021-02-07 LAB — INSULIN, RANDOM: INSULIN: 11 u[IU]/mL (ref 2.6–24.9)

## 2021-02-07 LAB — VITAMIN D 25 HYDROXY (VIT D DEFICIENCY, FRACTURES): Vit D, 25-Hydroxy: 37.1 ng/mL (ref 30.0–100.0)

## 2021-02-07 NOTE — Progress Notes (Signed)
Chief Complaint:   OBESITY Lisa Wade is here to discuss her progress with her obesity treatment plan along with follow-up of her obesity related diagnoses. Lisa Wade is on the Category 2 Plan and states she is following her eating plan approximately 50% of the time. Lisa Wade states she is doing 0 minutes 0 times per week.  Today's visit was #: 7 Starting weight: 185 lbs Starting date: 10/13/2020 Today's weight: 173 lbs Today's date: 02/06/2021 Total lbs lost to date: 12 Total lbs lost since last in-office visit: 0  Interim History: Lisa Wade reports that she has been overeating her snacks, and eating chips and sugar free cookies on some days. She is not weighing her protein.  Subjective:   1. Vitamin D deficiency Lisa Wade is on Vit D, and she denies nausea, vomiting, or muscle weakness.  2. Pre-diabetes Lisa Wade's last A1c was 5.8, and she is not on medications.  3. Other hyperlipidemia Lisa Wade is on Crestor, and she denies chest pain or myalgias.  4. Shortness of breath with exertion Lisa Wade notes shortness of breath with exertion. She denies dizziness or lightheadedness.  5. At risk for osteoporosis Lisa Wade is at higher risk of osteopenia and osteoporosis due to Vitamin D deficiency.   Assessment/Plan:   1. Vitamin D deficiency Low Vitamin D level contributes to fatigue and are associated with obesity, breast, and colon cancer. We will check labs today, and we will refill prescription Vitamin D 50,000 IU every week for 1 month. Lisa Wade will follow-up for routine testing of Vitamin D, at least 2-3 times per year to avoid over-replacement.  - Vitamin D, Ergocalciferol, (DRISDOL) 1.25 MG (50000 UNIT) CAPS capsule; Take 1 capsule (50,000 Units total) by mouth every 7 (seven) days.  Dispense: 4 capsule; Refill: 0 - VITAMIN D 25 Hydroxy (Vit-D Deficiency, Fractures)  2. Pre-diabetes Lisa Wade will continue to work on weight loss, exercise, and decreasing simple carbohydrates to help decrease the risk of diabetes. We  will check labs today.  - Comprehensive metabolic panel - Hemoglobin A1c - Insulin, random  3. Other hyperlipidemia Cardiovascular risk and specific lipid/LDL goals reviewed. We discussed several lifestyle modifications today. We will check labs today. Lisa Wade will continue to work on diet, exercise and weight loss efforts. Orders and follow up as documented in patient record.   Counseling Intensive lifestyle modifications are the first line treatment for this issue. . Dietary changes: Increase soluble fiber. Decrease simple carbohydrates. . Exercise changes: Moderate to vigorous-intensity aerobic activity 150 minutes per week if tolerated. . Lipid-lowering medications: see documented in medical record.  - Lipid panel  4. Shortness of breath with exertion Lisa Wade was done today. Lisa Wade has agreed to work on weight loss and gradually increase exercise to treat her exercise induced shortness of breath. Will continue to monitor closely.  5. At risk for osteoporosis Lisa Wade was given approximately 15 minutes of osteoporosis prevention counseling today. Lisa Wade is at risk for osteopenia and osteoporosis due to her Vitamin D deficiency. She was encouraged to take her Vitamin D and follow her higher calcium diet and increase strengthening exercise to help strengthen her bones and decrease her risk of osteopenia and osteoporosis.  Repetitive spaced learning was employed today to elicit superior memory formation and behavioral change.  6. Class 1 obesity with serious comorbidity and body mass index (BMI) of 30.0 to 30.9 in adult, unspecified obesity type Lisa Wade is currently in the action stage of change. As such, her goal is to continue with weight loss efforts. She has agreed  to the Category 2 Plan.   Exercise goals: No exercise has been prescribed at this time.  Behavioral modification strategies: better snacking choices and avoiding temptations.  Lisa Wade has agreed to follow-up with our clinic in 3 weeks. She  was informed of the importance of frequent follow-up visits to maximize her success with intensive lifestyle modifications for her multiple health conditions.   Lisa Wade was informed we would discuss her lab results at her next visit unless there is a critical issue that needs to be addressed sooner. Lisa Wade agreed to keep her next visit at the agreed upon time to discuss these results.  Objective:   Blood pressure 110/70, pulse 73, temperature 98.2 F (36.8 C), height 5\' 4"  (1.626 m), weight 173 lb (78.5 kg), last menstrual period 05/12/2020, SpO2 96 %. Body mass index is 29.7 kg/m.  General: Cooperative, alert, well developed, in no acute distress. HEENT: Conjunctivae and lids unremarkable. Cardiovascular: Regular rhythm.  Lungs: Normal work of breathing. Neurologic: No focal deficits.   Lab Results  Component Value Date   CREATININE 0.70 02/06/2021   BUN 14 02/06/2021   NA 147 (H) 02/06/2021   K 4.8 02/06/2021   CL 109 (H) 02/06/2021   CO2 22 02/06/2021   Lab Results  Component Value Date   ALT 23 02/06/2021   AST 19 02/06/2021   ALKPHOS 66 02/06/2021   BILITOT 0.6 02/06/2021   Lab Results  Component Value Date   HGBA1C 5.7 (H) 02/06/2021   HGBA1C 5.8 (H) 10/13/2020   Lab Results  Component Value Date   INSULIN 11.0 02/06/2021   INSULIN 11.4 10/13/2020   Lab Results  Component Value Date   TSH 1.070 10/13/2020   Lab Results  Component Value Date   CHOL 177 02/06/2021   HDL 61 02/06/2021   LDLCALC 95 02/06/2021   TRIG 120 02/06/2021   CHOLHDL 2.9 02/06/2021   Lab Results  Component Value Date   WBC 5.4 10/13/2020   HGB 14.7 10/13/2020   HCT 43.5 10/13/2020   MCV 86 10/13/2020   PLT 308 10/13/2020   No results found for: IRON, TIBC, FERRITIN  Attestation Statements:   Reviewed by clinician on day of visit: allergies, medications, problem list, medical history, surgical history, family history, social history, and previous encounter notes.   Wilhemena Durie, am acting as transcriptionist for [employer], PA-C.  I have reviewed the above documentation for accuracy and completeness, and I agree with the above. Abby Potash, PA-C

## 2021-02-20 ENCOUNTER — Ambulatory Visit (INDEPENDENT_AMBULATORY_CARE_PROVIDER_SITE_OTHER): Payer: 59 | Admitting: Physician Assistant

## 2021-02-20 ENCOUNTER — Other Ambulatory Visit: Payer: Self-pay

## 2021-02-20 ENCOUNTER — Encounter (INDEPENDENT_AMBULATORY_CARE_PROVIDER_SITE_OTHER): Payer: Self-pay | Admitting: Physician Assistant

## 2021-02-20 VITALS — BP 97/61 | HR 69 | Temp 97.9°F | Ht 64.0 in | Wt 174.0 lb

## 2021-02-20 DIAGNOSIS — Z6830 Body mass index (BMI) 30.0-30.9, adult: Secondary | ICD-10-CM | POA: Diagnosis not present

## 2021-02-20 DIAGNOSIS — E559 Vitamin D deficiency, unspecified: Secondary | ICD-10-CM | POA: Diagnosis not present

## 2021-02-20 DIAGNOSIS — Z9189 Other specified personal risk factors, not elsewhere classified: Secondary | ICD-10-CM | POA: Diagnosis not present

## 2021-02-20 DIAGNOSIS — R7303 Prediabetes: Secondary | ICD-10-CM | POA: Diagnosis not present

## 2021-02-20 DIAGNOSIS — E669 Obesity, unspecified: Secondary | ICD-10-CM | POA: Diagnosis not present

## 2021-02-20 MED ORDER — VITAMIN D (ERGOCALCIFEROL) 1.25 MG (50000 UNIT) PO CAPS: 50000.0000 [IU] | ORAL_CAPSULE | ORAL | 0 refills | Status: DC

## 2021-02-20 NOTE — Progress Notes (Signed)
Chief Complaint:   OBESITY Lisa Wade is here to discuss her progress with her obesity treatment plan along with follow-up of her obesity related diagnoses. Lisa Wade is on the Category 2 Plan and states she is following her eating plan approximately 50% of the time. Lisa Wade states she is doing 0 minutes 0 times per week.  Today's visit was #: 8 Starting weight: 185 lbs Starting date: 10/13/2020 Today's weight: 174 lbs Today's date: 02/20/2021 Total lbs lost to date: 11 Total lbs lost since last in-office visit: 0  Interim History: Lisa Wade reports that the last 2 weeks have been very stressful and that she has been doing some stress eating. She has not been walking but she is willing to start.  Subjective:   1. Vitamin D deficiency Lisa Wade's last Vit D level was 37.1, improved from 13.0. I discussed labs with the patient today.  2. Pre-diabetes Lisa Wade's last A1c was 5.7. She is not on medications, and she is having cravings but denies polyphagia. I discussed labs with the patient today.  3. At risk for diabetes mellitus Lisa Wade is at higher than average risk for developing diabetes due to obesity.   Assessment/Plan:   1. Vitamin D deficiency Low Vitamin D level contributes to fatigue and are associated with obesity, breast, and colon cancer. We will refill prescription Vitamin D for 1 month. Mccall will follow-up for routine testing of Vitamin D, at least 2-3 times per year to avoid over-replacement.  - Vitamin D, Ergocalciferol, (DRISDOL) 1.25 MG (50000 UNIT) CAPS capsule; Take 1 capsule (50,000 Units total) by mouth every 7 (seven) days.  Dispense: 4 capsule; Refill: 0  2. Pre-diabetes Lisa Wade will continue with her meal plan, exercise, and decreasing simple carbohydrates to help decrease the risk of diabetes.   3. At risk for diabetes mellitus Lisa Wade was given approximately 15 minutes of diabetes education and counseling today. We discussed intensive lifestyle modifications today with an emphasis on  weight loss as well as increasing exercise and decreasing simple carbohydrates in her diet. We also reviewed medication options with an emphasis on risk versus benefit of those discussed.   Repetitive spaced learning was employed today to elicit superior memory formation and behavioral change.  4. Class 1 obesity with serious comorbidity and body mass index (BMI) of 30.0 to 30.9 in adult, unspecified obesity type Lisa Wade is currently in the action stage of change. As such, her goal is to continue with weight loss efforts. She has agreed to the Category 2 Plan.   Exercise goals: No exercise has been prescribed at this time.  Behavioral modification strategies: meal planning and cooking strategies and keeping healthy foods in the home.  Lisa Wade has agreed to follow-up with our clinic in 2 weeks. She was informed of the importance of frequent follow-up visits to maximize her success with intensive lifestyle modifications for her multiple health conditions.   Objective:   Blood pressure 97/61, pulse 69, temperature 97.9 F (36.6 C), height 5\' 4"  (1.626 m), weight 174 lb (78.9 kg), last menstrual period 05/12/2020, SpO2 97 %. Body mass index is 29.87 kg/m.  General: Cooperative, alert, well developed, in no acute distress. HEENT: Conjunctivae and lids unremarkable. Cardiovascular: Regular rhythm.  Lungs: Normal work of breathing. Neurologic: No focal deficits.   Lab Results  Component Value Date   CREATININE 0.70 02/06/2021   BUN 14 02/06/2021   NA 147 (H) 02/06/2021   K 4.8 02/06/2021   CL 109 (H) 02/06/2021   CO2 22 02/06/2021  Lab Results  Component Value Date   ALT 23 02/06/2021   AST 19 02/06/2021   ALKPHOS 66 02/06/2021   BILITOT 0.6 02/06/2021   Lab Results  Component Value Date   HGBA1C 5.7 (H) 02/06/2021   HGBA1C 5.8 (H) 10/13/2020   Lab Results  Component Value Date   INSULIN 11.0 02/06/2021   INSULIN 11.4 10/13/2020   Lab Results  Component Value Date   TSH  1.070 10/13/2020   Lab Results  Component Value Date   CHOL 177 02/06/2021   HDL 61 02/06/2021   LDLCALC 95 02/06/2021   TRIG 120 02/06/2021   CHOLHDL 2.9 02/06/2021   Lab Results  Component Value Date   WBC 5.4 10/13/2020   HGB 14.7 10/13/2020   HCT 43.5 10/13/2020   MCV 86 10/13/2020   PLT 308 10/13/2020   No results found for: IRON, TIBC, FERRITIN  Attestation Statements:   Reviewed by clinician on day of visit: allergies, medications, problem list, medical history, surgical history, family history, social history, and previous encounter notes.   Wilhemena Durie, am acting as transcriptionist for [employer], PA-C.  I have reviewed the above documentation for accuracy and completeness, and I agree with the above. Abby Potash, PA-C

## 2021-03-13 ENCOUNTER — Ambulatory Visit (INDEPENDENT_AMBULATORY_CARE_PROVIDER_SITE_OTHER): Payer: 59 | Admitting: Physician Assistant

## 2021-03-15 ENCOUNTER — Other Ambulatory Visit: Payer: Self-pay

## 2021-03-15 ENCOUNTER — Ambulatory Visit (INDEPENDENT_AMBULATORY_CARE_PROVIDER_SITE_OTHER): Payer: 59 | Admitting: Family Medicine

## 2021-03-15 ENCOUNTER — Encounter (INDEPENDENT_AMBULATORY_CARE_PROVIDER_SITE_OTHER): Payer: Self-pay | Admitting: Family Medicine

## 2021-03-15 VITALS — BP 90/59 | HR 79 | Temp 98.1°F | Ht 64.0 in | Wt 176.0 lb

## 2021-03-15 DIAGNOSIS — R7303 Prediabetes: Secondary | ICD-10-CM

## 2021-03-15 DIAGNOSIS — F39 Unspecified mood [affective] disorder: Secondary | ICD-10-CM | POA: Diagnosis not present

## 2021-03-15 DIAGNOSIS — E669 Obesity, unspecified: Secondary | ICD-10-CM

## 2021-03-15 DIAGNOSIS — Z6831 Body mass index (BMI) 31.0-31.9, adult: Secondary | ICD-10-CM

## 2021-03-15 DIAGNOSIS — E559 Vitamin D deficiency, unspecified: Secondary | ICD-10-CM

## 2021-03-15 DIAGNOSIS — Z9189 Other specified personal risk factors, not elsewhere classified: Secondary | ICD-10-CM | POA: Diagnosis not present

## 2021-03-15 MED ORDER — BUPROPION HCL 75 MG PO TABS: 75.0000 mg | ORAL_TABLET | Freq: Every morning | ORAL | 0 refills | Status: DC

## 2021-03-22 NOTE — Progress Notes (Signed)
Chief Complaint:   OBESITY Lisa Wade is here to discuss her progress with her obesity treatment plan along with follow-up of her obesity related diagnoses.   Today's visit was #: 9 Starting weight: 185 lbs Starting date: 10/13/2020 Today's weight: 176 lbs Today's date: 03/15/2021 Weight change since last visit: +2 Total lbs lost to date: 9 lbs Body mass index is 30.21 kg/m.  Total weight loss percentage to date: -4.86%  Interim History: Lisa Wade is struggling with water intake - gets very little.  Difficult to meal prep and orders out a lot.  Gets fried chicken from [employer] - eats out 6 days per week.  Struggling emotionally.  No time to care for herself.  Plan:  Consider changing to journaling plan in the future.  Current Meal Plan: the Category 2 Plan for 50% of the time.  Current Exercise Plan: None.  Assessment/Plan:   Meds ordered this encounter  Medications   buPROPion (WELLBUTRIN) 75 MG tablet    Sig: Take 1 tablet (75 mg total) by mouth in the morning.    Dispense:  30 tablet    Refill:  0    Ov for rf    1. Prediabetes At goal. Goal is HgbA1c < 5.7.  Medication: None.    Plan:  She will continue to focus on protein-rich, low simple carbohydrate foods. We reviewed the importance of hydration, regular exercise for stress reduction, and restorative sleep.   Lab Results  Component Value Date   HGBA1C 5.7 (H) 02/06/2021   Lab Results  Component Value Date   INSULIN 11.0 02/06/2021   INSULIN 11.4 10/13/2020   2. Vitamin D deficiency Not at goal. Current vitamin D is 37.1, tested on 02/06/2021. Optimal goal > 50 ng/dL.   She is taking vitamin D 50,000 IU weekly.  Only takes it 50% of the time or less.    Plan: Continue to take prescription Vitamin D @50 ,000 IU every week as prescribed.  Follow-up for routine testing of Vitamin D, at least 2-3 times per year to avoid over-replacement.  Denies need for refill; picked up new prescription yesterday.  3. Mood disorder  (Lisa Wade), with emotional eating Not at goal. Medication: Lexapro 20 mg daily.  Increased stress with son who is an addict.    Plan:  In addition to Lexapro, start Wellbutrin 75 mg in the morning only.  Behavior modification techniques were discussed today to help deal with emotional/non-hunger eating behaviors.  - Start buPROPion (WELLBUTRIN) 75 MG tablet; Take 1 tablet (75 mg total) by mouth in the morning.  Dispense: 30 tablet; Refill: 0  4. At risk for depression Lisa Wade was given approximately 22 minutes of depression prevention counseling today due to their higher than average risk for this condition. The patient has several risk factors for depression such as chronic medical conditions, sleep issues, major life stressors/events, etc., and we discussed these today.  Lisa Wade was also counseled on the importance of a healthy work-life balance, a healthy relationship with food, and a good support system.  We discussed various strategies to help cope with these emotions as well.  I recommended counseling, meditation or prayer, healthy eating habits, sleep hygiene, and exercising to help manage these feelings.   5. Obesity, current BMI 30.2  Course: Lisa Wade is currently in the action stage of change. As such, her goal is to continue with weight loss efforts.   Nutrition goals: She has agreed to the Category 2 Plan.   Exercise  goals: All adults should avoid inactivity. Some physical activity is better than none, and adults who participate in any amount of physical activity gain some health benefits.  Behavioral modification strategies: meal planning and cooking strategies, keeping healthy foods in the home, and planning for success.  Lisa Wade has agreed to follow-up with our clinic in 2-3 weeks. She was informed of the importance of frequent follow-up visits to maximize her success with intensive lifestyle modifications for her multiple health conditions.   Objective:   Blood pressure  (!) 90/59, pulse 79, temperature 98.1 F (36.7 C), height 5\' 4"  (1.626 m), weight 176 lb (79.8 kg), last menstrual period 05/12/2020, SpO2 98 %. Body mass index is 30.21 kg/m.  General: Cooperative, alert, well developed, in no acute distress. HEENT: Conjunctivae and lids unremarkable. Cardiovascular: Regular rhythm.  Lungs: Normal work of breathing. Neurologic: No focal deficits.   Lab Results  Component Value Date   CREATININE 0.70 02/06/2021   BUN 14 02/06/2021   NA 147 (H) 02/06/2021   K 4.8 02/06/2021   CL 109 (H) 02/06/2021   CO2 22 02/06/2021   Lab Results  Component Value Date   ALT 23 02/06/2021   AST 19 02/06/2021   ALKPHOS 66 02/06/2021   BILITOT 0.6 02/06/2021   Lab Results  Component Value Date   HGBA1C 5.7 (H) 02/06/2021   HGBA1C 5.8 (H) 10/13/2020   Lab Results  Component Value Date   INSULIN 11.0 02/06/2021   INSULIN 11.4 10/13/2020   Lab Results  Component Value Date   TSH 1.070 10/13/2020   Lab Results  Component Value Date   CHOL 177 02/06/2021   HDL 61 02/06/2021   LDLCALC 95 02/06/2021   TRIG 120 02/06/2021   CHOLHDL 2.9 02/06/2021   Lab Results  Component Value Date   WBC 5.4 10/13/2020   HGB 14.7 10/13/2020   HCT 43.5 10/13/2020   MCV 86 10/13/2020   PLT 308 10/13/2020   Attestation Statements:   Reviewed by clinician on day of visit: allergies, medications, problem list, medical history, surgical history, family history, social history, and previous encounter notes.  I, Water quality scientist, CMA, am acting as Location manager for [employer], DO.  I have reviewed the above documentation for accuracy and completeness, and I agree with the above. Marjory Sneddon, D.O.  The [place] was signed into law in 2016 which includes the topic of electronic health records.  This provides immediate access to information in MyChart.  This includes consultation notes, operative notes, office notes, lab results and pathology  reports.  If you have any questions about what you read please let us know at your next visit so we can discuss your concerns and take corrective action if need be.  We are right here with you.

## 2021-04-03 ENCOUNTER — Other Ambulatory Visit: Payer: Self-pay

## 2021-04-03 ENCOUNTER — Ambulatory Visit (INDEPENDENT_AMBULATORY_CARE_PROVIDER_SITE_OTHER): Payer: 59 | Admitting: Family Medicine

## 2021-04-03 VITALS — BP 91/57 | HR 73 | Temp 98.1°F | Ht 64.0 in | Wt 175.0 lb

## 2021-04-03 DIAGNOSIS — R7303 Prediabetes: Secondary | ICD-10-CM | POA: Diagnosis not present

## 2021-04-03 DIAGNOSIS — E559 Vitamin D deficiency, unspecified: Secondary | ICD-10-CM

## 2021-04-03 DIAGNOSIS — Z6831 Body mass index (BMI) 31.0-31.9, adult: Secondary | ICD-10-CM

## 2021-04-03 DIAGNOSIS — Z9189 Other specified personal risk factors, not elsewhere classified: Secondary | ICD-10-CM

## 2021-04-03 DIAGNOSIS — E669 Obesity, unspecified: Secondary | ICD-10-CM

## 2021-04-03 DIAGNOSIS — F39 Unspecified mood [affective] disorder: Secondary | ICD-10-CM | POA: Diagnosis not present

## 2021-04-03 MED ORDER — BUPROPION HCL 75 MG PO TABS: 75.0000 mg | ORAL_TABLET | Freq: Every morning | ORAL | 0 refills | Status: DC

## 2021-04-03 MED ORDER — VITAMIN D (ERGOCALCIFEROL) 1.25 MG (50000 UNIT) PO CAPS: 50000.0000 [IU] | ORAL_CAPSULE | ORAL | 0 refills | Status: DC

## 2021-04-06 NOTE — Progress Notes (Signed)
Chief Complaint:   OBESITY Lisa Wade is here to discuss her progress with her obesity treatment plan along with follow-up of her obesity related diagnoses.   Today's visit was #: 10 Starting weight: 185 lbs Starting date: 10/13/2020 Today's weight: 175 lbs Today's date: 04/03/2021 Weight change since last visit: 1 lb Total lbs lost to date: 10 lbs Body mass index is 30.04 kg/m.  Total weight loss percentage to date: -5.41%  Interim History: Lisa Wade is trying to stay on plan, but had a birthday over the weekend and had more celebration eating over most of the weekend.  Water intake is 40 ounces per day, most of the time.  Current Meal Plan: the Category 2 Plan for 75% of the time.  Current Exercise Plan: None.  Assessment/Plan:   Medications Discontinued During This Encounter  Medication Reason   Vitamin D, Ergocalciferol, (DRISDOL) 1.25 MG (50000 UNIT) CAPS capsule Reorder   buPROPion (WELLBUTRIN) 75 MG tablet Reorder   Meds ordered this encounter  Medications   buPROPion (WELLBUTRIN) 75 MG tablet    Sig: Take 1 tablet (75 mg total) by mouth in the morning.    Dispense:  30 tablet    Refill:  0    Ov for rf   Vitamin D, Ergocalciferol, (DRISDOL) 1.25 MG (50000 UNIT) CAPS capsule    Sig: Take 1 capsule (50,000 Units total) by mouth every 7 (seven) days.    Dispense:  4 capsule    Refill:  0   1. Prediabetes At goal. Goal is HgbA1c < 5.7.  Medication: None.  She endorses carb cravings, but sh ehas been eating lately due to her birthday.  Plan:  discussed metformin, and since she recently started Wellbutrin, will hold off for now.  Handouts on metformin given after counseling done.  She will increase her protein intake.  She will continue to focus on protein-rich, low simple carbohydrate foods. We reviewed the importance of hydration, regular exercise for stress reduction, and restorative sleep.   Lab Results  Component Value Date   HGBA1C 5.7 (H) 02/06/2021   Lab Results   Component Value Date   INSULIN 11.0 02/06/2021   INSULIN 11.4 10/13/2020   2. Vitamin D deficiency Not at goal. She is taking vitamin D 50,000 IU weekly.  Plan: Continue to take prescription Vitamin D @50 ,000 IU every week as prescribed.  Follow-up for routine testing of Vitamin D, at least 2-3 times per year to avoid over-replacement.  Lab Results  Component Value Date   VD25OH 37.1 02/06/2021   VD25OH 13.0 (L) 10/13/2020   - Refill Vitamin D, Ergocalciferol, (DRISDOL) 1.25 MG (50000 UNIT) CAPS capsule; Take 1 capsule (50,000 Units total) by mouth every 7 (seven) days.  Dispense: 4 capsule; Refill: 0  3. Mood disorder, with emotional eating Controlled. Medication: Wellbutrin 75 mg daily, Lexapro 20 mg daily.  Started Wellbutrin at last office visit.  Going well and tolerating well.  More calm and less emotional eating than before.  Plan:  Refill Wellbutrin.  Continue Lexapro.  Stress management techniques discussed with patient.  - Refill buPROPion (WELLBUTRIN) 75 MG tablet; Take 1 tablet (75 mg total) by mouth in the morning.  Dispense: 30 tablet; Refill: 0  4. At risk for deficient intake of food Reathel was given extensive education and counseling today of more than 9 minutes on risks associated with deficient food intake.  Counseled her on the importance of following our prescribed meal plan and eating adequate amounts of protein.  Discussed with Lisa Wade that inadequate food intake over longer periods of time can slow their metabolism down significantly.   5. Class 1 obesity with serious comorbidity and body mass index (BMI) of 31.0 to 31.9 in adult, unspecified obesity type  Course: [name] is currently in the action stage of change. As such, her goal is to continue with weight loss efforts.   Nutrition goals: She has agreed to the Category 2 Plan.   Exercise goals: All adults should avoid inactivity. Some physical activity is better than none, and adults who participate in  any amount of physical activity gain some health benefits.  Behavioral modification strategies: increasing lean protein intake, decreasing simple carbohydrates, and no skipping meals.  Lisa Wade has agreed to follow-up with our clinic in 2-3 weeks. She was informed of the importance of frequent follow-up visits to maximize her success with intensive lifestyle modifications for her multiple health conditions.   Objective:   Blood pressure (!) 91/57, pulse 73, temperature 98.1 F (36.7 C), height 5\' 4"  (1.626 m), weight 175 lb (79.4 kg), last menstrual period 05/12/2020, SpO2 95 %. Body mass index is 30.04 kg/m.  General: Cooperative, alert, well developed, in no acute distress. HEENT: Conjunctivae and lids unremarkable. Cardiovascular: Regular rhythm.  Lungs: Normal work of breathing. Neurologic: No focal deficits.   Lab Results  Component Value Date   CREATININE 0.70 02/06/2021   BUN 14 02/06/2021   NA 147 (H) 02/06/2021   K 4.8 02/06/2021   CL 109 (H) 02/06/2021   CO2 22 02/06/2021   Lab Results  Component Value Date   ALT 23 02/06/2021   AST 19 02/06/2021   ALKPHOS 66 02/06/2021   BILITOT 0.6 02/06/2021   Lab Results  Component Value Date   HGBA1C 5.7 (H) 02/06/2021   HGBA1C 5.8 (H) 10/13/2020   Lab Results  Component Value Date   INSULIN 11.0 02/06/2021   INSULIN 11.4 10/13/2020   Lab Results  Component Value Date   TSH 1.070 10/13/2020   Lab Results  Component Value Date   CHOL 177 02/06/2021   HDL 61 02/06/2021   LDLCALC 95 02/06/2021   TRIG 120 02/06/2021   CHOLHDL 2.9 02/06/2021   Lab Results  Component Value Date   VD25OH 37.1 02/06/2021   VD25OH 13.0 (L) 10/13/2020   Lab Results  Component Value Date   WBC 5.4 10/13/2020   HGB 14.7 10/13/2020   HCT 43.5 10/13/2020   MCV 86 10/13/2020   PLT 308 10/13/2020   Attestation Statements:   Reviewed by clinician on day of visit: allergies, medications, problem list, medical history, surgical history,  family history, social history, and previous encounter notes.  I, Water quality scientist, CMA, am acting as Location manager for [employer], DO.  I have reviewed the above documentation for accuracy and completeness, and I agree with the above. Marjory Sneddon, D.O.  The [place] was signed into law in 2016 which includes the topic of electronic health records.  This provides immediate access to information in MyChart.  This includes consultation notes, operative notes, office notes, lab results and pathology reports.  If you have any questions about what you read please let us know at your next visit so we can discuss your concerns and take corrective action if need be.  We are right here with you.

## 2021-04-10 ENCOUNTER — Other Ambulatory Visit (INDEPENDENT_AMBULATORY_CARE_PROVIDER_SITE_OTHER): Payer: Self-pay | Admitting: Family Medicine

## 2021-04-10 DIAGNOSIS — F39 Unspecified mood [affective] disorder: Secondary | ICD-10-CM

## 2021-04-11 NOTE — Telephone Encounter (Signed)
Last OV with Dr Opalski 

## 2021-04-24 ENCOUNTER — Ambulatory Visit (INDEPENDENT_AMBULATORY_CARE_PROVIDER_SITE_OTHER): Payer: 59 | Admitting: Family Medicine

## 2021-05-02 ENCOUNTER — Encounter (INDEPENDENT_AMBULATORY_CARE_PROVIDER_SITE_OTHER): Payer: Self-pay | Admitting: Family Medicine

## 2021-05-02 ENCOUNTER — Ambulatory Visit (INDEPENDENT_AMBULATORY_CARE_PROVIDER_SITE_OTHER): Payer: 59 | Admitting: Family Medicine

## 2021-05-02 ENCOUNTER — Other Ambulatory Visit: Payer: Self-pay

## 2021-05-02 VITALS — BP 94/59 | HR 80 | Temp 98.2°F | Ht 64.0 in | Wt 177.0 lb

## 2021-05-02 DIAGNOSIS — E559 Vitamin D deficiency, unspecified: Secondary | ICD-10-CM | POA: Diagnosis not present

## 2021-05-02 DIAGNOSIS — E669 Obesity, unspecified: Secondary | ICD-10-CM

## 2021-05-02 DIAGNOSIS — F39 Unspecified mood [affective] disorder: Secondary | ICD-10-CM

## 2021-05-02 DIAGNOSIS — Z9189 Other specified personal risk factors, not elsewhere classified: Secondary | ICD-10-CM

## 2021-05-02 DIAGNOSIS — Z6831 Body mass index (BMI) 31.0-31.9, adult: Secondary | ICD-10-CM

## 2021-05-02 MED ORDER — BUPROPION HCL 75 MG PO TABS: 75.0000 mg | ORAL_TABLET | Freq: Every morning | ORAL | 0 refills | Status: DC

## 2021-05-02 MED ORDER — VITAMIN D (ERGOCALCIFEROL) 1.25 MG (50000 UNIT) PO CAPS: 50000.0000 [IU] | ORAL_CAPSULE | ORAL | 0 refills | Status: DC

## 2021-05-08 NOTE — Progress Notes (Signed)
Chief Complaint:   OBESITY Lisa Wade is here to discuss her progress with her obesity treatment plan along with follow-up of her obesity related diagnoses. Lisa Wade is on the Category 2 Plan and states she is following her eating plan approximately 75% of the time. Lisa Wade states she is doing 0 minutes 0 times per week.  Today's visit was #: 11 Starting weight: 185 lbs Starting date: 10/13/2020 Today's weight: 177 lbs Today's date: 05/02/2021 Total lbs lost to date: 8 Total lbs lost since last in-office visit: 0  Interim History: Shondrika notes her challenge is eating out a lot more lately, and she hasn't been exercising lately. She has no issues with the meal plan, just that she needs to follow it more.  Subjective:   1. Vitamin D deficiency Lisa Wade is currently taking prescription vitamin D 50,000 IU each week. She denies nausea, vomiting or muscle weakness.  2. Mood disorder, with emotional eating Lisa Wade is doing much less drinking ETOH, and she notes much less stress eating. She has no issues with sleep.  3. At risk for dehydration Lisa Wade is at risk for dehydration due to inadequate water intake.  Assessment/Plan:  No orders of the defined types were placed in this encounter.   Medications Discontinued During This Encounter  Medication Reason   buPROPion (WELLBUTRIN) 75 MG tablet Reorder   Vitamin D, Ergocalciferol, (DRISDOL) 1.25 MG (50000 UNIT) CAPS capsule Reorder     Meds ordered this encounter  Medications   buPROPion (WELLBUTRIN) 75 MG tablet    Sig: Take 1 tablet (75 mg total) by mouth in the morning.    Dispense:  30 tablet    Refill:  0    Ov for rf   Vitamin D, Ergocalciferol, (DRISDOL) 1.25 MG (50000 UNIT) CAPS capsule    Sig: Take 1 capsule (50,000 Units total) by mouth every 7 (seven) days.    Dispense:  4 capsule    Refill:  0     1. Vitamin D deficiency Low Vitamin D level contributes to fatigue and are associated with obesity, breast, and colon cancer. We will  refill prescription Vitamin D for 1 month. Realynn will follow-up for routine testing of Vitamin D, at least 2-3 times per year to avoid over-replacement.  - Vitamin D, Ergocalciferol, (DRISDOL) 1.25 MG (50000 UNIT) CAPS capsule; Take 1 capsule (50,000 Units total) by mouth every 7 (seven) days.  Dispense: 4 capsule; Refill: 0  2. Mood disorder, with emotional eating Behavior modification techniques were discussed today to help Lisa Wade deal with her emotional/non-hunger eating behaviors. We will refill Wellbutrin for 1 month. Baila declines the need for change in dose at this time. Orders and follow up as documented in patient record.   - buPROPion (WELLBUTRIN) 75 MG tablet; Take 1 tablet (75 mg total) by mouth in the morning.  Dispense: 30 tablet; Refill: 0  3. At risk for dehydration Lisa Wade was given approximately 9 minutes dehydration prevention counseling today. Lisa Wade is at risk for dehydration due to weight loss and current medication(s). She was encouraged to hydrate and monitor fluid status to avoid dehydration as well as weight loss plateaus.   4. Obesity with current BMI Of 30.5 Lisa Wade is currently in the action stage of change. As such, her goal is to continue with weight loss efforts. She has agreed to the Category 2 Plan.   Less eating out and more food prep, and increase exercise.  Exercise goals: Walking for 2 days per week for 30 minutes  at a time.  Behavioral modification strategies: decreasing eating out, no skipping meals, and meal planning and cooking strategies.  Lisa Wade has agreed to follow-up with our clinic in 3 weeks. She was informed of the importance of frequent follow-up visits to maximize her success with intensive lifestyle modifications for her multiple health conditions.   Objective:   Blood pressure (!) 94/59, pulse 80, temperature 98.2 F (36.8 C), height '5\' 4"'$  (1.626 m), weight 177 lb (80.3 kg), last menstrual period 05/12/2020, SpO2 96 %. Body mass index is 30.38  kg/m.  General: Cooperative, alert, well developed, in no acute distress. HEENT: Conjunctivae and lids unremarkable. Cardiovascular: Regular rhythm.  Lungs: Normal work of breathing. Neurologic: No focal deficits.   Lab Results  Component Value Date   CREATININE 0.70 02/06/2021   BUN 14 02/06/2021   NA 147 (H) 02/06/2021   K 4.8 02/06/2021   CL 109 (H) 02/06/2021   CO2 22 02/06/2021   Lab Results  Component Value Date   ALT 23 02/06/2021   AST 19 02/06/2021   ALKPHOS 66 02/06/2021   BILITOT 0.6 02/06/2021   Lab Results  Component Value Date   HGBA1C 5.7 (H) 02/06/2021   HGBA1C 5.8 (H) 10/13/2020   Lab Results  Component Value Date   INSULIN 11.0 02/06/2021   INSULIN 11.4 10/13/2020   Lab Results  Component Value Date   TSH 1.070 10/13/2020   Lab Results  Component Value Date   CHOL 177 02/06/2021   HDL 61 02/06/2021   LDLCALC 95 02/06/2021   TRIG 120 02/06/2021   CHOLHDL 2.9 02/06/2021   Lab Results  Component Value Date   VD25OH 37.1 02/06/2021   VD25OH 13.0 (L) 10/13/2020   Lab Results  Component Value Date   WBC 5.4 10/13/2020   HGB 14.7 10/13/2020   HCT 43.5 10/13/2020   MCV 86 10/13/2020   PLT 308 10/13/2020   No results found for: IRON, TIBC, FERRITIN  Attestation Statements:   Reviewed by clinician on day of visit: allergies, medications, problem list, medical history, surgical history, family history, social history, and previous encounter notes.   Wilhemena Durie, am acting as transcriptionist for [employer], DO.  I have reviewed the above documentation for accuracy and completeness, and I agree with the above. Marjory Sneddon, D.O.  The [place] was signed into law in 2016 which includes the topic of electronic health records.  This provides immediate access to information in MyChart.  This includes consultation notes, operative notes, office notes, lab results and pathology reports.  If you have any questions  about what you read please let us know at your next visit so we can discuss your concerns and take corrective action if need be.  We are right here with you.

## 2021-05-24 ENCOUNTER — Encounter (INDEPENDENT_AMBULATORY_CARE_PROVIDER_SITE_OTHER): Payer: Self-pay | Admitting: Family Medicine

## 2021-05-24 ENCOUNTER — Other Ambulatory Visit: Payer: Self-pay

## 2021-05-24 ENCOUNTER — Ambulatory Visit (INDEPENDENT_AMBULATORY_CARE_PROVIDER_SITE_OTHER): Payer: 59 | Admitting: Family Medicine

## 2021-05-24 VITALS — BP 95/59 | HR 73 | Temp 97.8°F | Ht 64.0 in | Wt 178.0 lb

## 2021-05-24 DIAGNOSIS — E669 Obesity, unspecified: Secondary | ICD-10-CM

## 2021-05-24 DIAGNOSIS — Z9189 Other specified personal risk factors, not elsewhere classified: Secondary | ICD-10-CM

## 2021-05-24 DIAGNOSIS — E559 Vitamin D deficiency, unspecified: Secondary | ICD-10-CM | POA: Diagnosis not present

## 2021-05-24 DIAGNOSIS — E66811 Obesity, class 1: Secondary | ICD-10-CM

## 2021-05-24 DIAGNOSIS — Z6831 Body mass index (BMI) 31.0-31.9, adult: Secondary | ICD-10-CM

## 2021-05-24 DIAGNOSIS — F39 Unspecified mood [affective] disorder: Secondary | ICD-10-CM

## 2021-05-24 MED ORDER — VITAMIN D (ERGOCALCIFEROL) 1.25 MG (50000 UNIT) PO CAPS: 50000.0000 [IU] | ORAL_CAPSULE | ORAL | 0 refills | Status: DC

## 2021-05-24 MED ORDER — BUPROPION HCL 75 MG PO TABS: 75.0000 mg | ORAL_TABLET | Freq: Every morning | ORAL | 0 refills | Status: DC

## 2021-05-25 NOTE — Progress Notes (Signed)
Chief Complaint:   OBESITY Lisa Wade is here to discuss her progress with her obesity treatment plan along with follow-up of her obesity related diagnoses. Lisa Wade is on the Category 2 Plan and states she is following her eating plan approximately 50-60% of the time. Lisa Wade states she is not currently exercising.  Today's visit was #: 12 Starting weight: 185 lbs Starting date: 10/13/2020 Today's weight: 178 lbs Today's date: 05/24/2021 Total lbs lost to date: 7 Total lbs lost since last in-office visit: +1  Interim History: Lisa Wade gave in to ice cream and wine at times but bupropion is helping to decrease cravings for these things. She has no issues with plan but has been distracted lately due to stress.  Subjective:   1. Mood disorder, with emotional eating Lisa Wade's son is back in a drug rehab facility again. She has had a lot of stress and stress eating on ice cream.  2. Vitamin D deficiency Lisa Wade is tolerating medication(s) well without side effects.  Medication compliance is good and patient appears to be taking it as prescribed.  Denies additional concerns regarding this condition.   Lab Results  Component Value Date   VD25OH 37.1 02/06/2021   VD25OH 13.0 (L) 10/13/2020    Assessment/Plan:  No orders of the defined types were placed in this encounter.   Medications Discontinued During This Encounter  Medication Reason   ALPRAZolam (XANAX) 0.5 MG tablet Error   buPROPion (WELLBUTRIN) 75 MG tablet Reorder   Vitamin D, Ergocalciferol, (DRISDOL) 1.25 MG (50000 UNIT) CAPS capsule Reorder     Meds ordered this encounter  Medications   buPROPion (WELLBUTRIN) 75 MG tablet    Sig: Take 1 tablet (75 mg total) by mouth in the morning.    Dispense:  30 tablet    Refill:  0    Ov for rf   Vitamin D, Ergocalciferol, (DRISDOL) 1.25 MG (50000 UNIT) CAPS capsule    Sig: Take 1 capsule (50,000 Units total) by mouth every 7 (seven) days.    Dispense:  4 capsule    Refill:  0     Ov for rf     1. Mood disorder, with emotional eating Continue Wellbutrin and Lexapro. Increase walking and stress management activities. Behavior modification techniques were discussed today to help Lisa Wade deal with her emotional/non-hunger eating behaviors.  Orders and follow up as documented in patient record.   Refill- buPROPion (WELLBUTRIN) 75 MG tablet; Take 1 tablet (75 mg total) by mouth in the morning.  Dispense: 30 tablet; Refill: 0  2. Vitamin D deficiency Low Vitamin D level contributes to fatigue and are associated with obesity, breast, and colon cancer. She agrees to continue to take prescription Vitamin D 50,000 IU every week and will follow-up for routine testing of Vitamin D, at least 2-3 times per year to avoid over-replacement.  Refill- Vitamin D, Ergocalciferol, (DRISDOL) 1.25 MG (50000 UNIT) CAPS capsule; Take 1 capsule (50,000 Units total) by mouth every 7 (seven) days.  Dispense: 4 capsule; Refill: 0  3. At risk for malnutrition Lisa Wade was given extensive malnutrition prevention education and counseling today of more than 9 minutes.  Counseled her that malnutrition refers to inappropriate nutrients or not the right balance of nutrients for optimal health.  Discussed with Lisa Wade that it is absolutely possible to be malnourished but yet obese.  Risk factors, including but not limited to, inappropriate dietary choices, difficulty with obtaining food due to physical or financial limitations, and various  physical and mental health conditions were reviewed with Lisa Wade.   4. Obesity with current BMI of 30.7  Lisa Wade is currently in the action stage of change. As such, her goal is to continue with weight loss efforts. She has agreed to the Category 2 Plan.   Pt's goal for next OV is to exercise.  Exercise goals:  Start walking 30 minutes twice weekly.   Behavioral modification strategies: keeping healthy foods in the home and avoiding temptations.  Lisa Wade has agreed  to follow-up with our clinic in 3-4 weeks. She was informed of the importance of frequent follow-up visits to maximize her success with intensive lifestyle modifications for her multiple health conditions.   Objective:   Blood pressure (!) 95/59, pulse 73, temperature 97.8 F (36.6 C), height '5\' 4"'$  (1.626 m), weight 178 lb (80.7 kg), last menstrual period 05/12/2020, SpO2 97 %. Body mass index is 30.55 kg/m.  General: Cooperative, alert, well developed, in no acute distress. HEENT: Conjunctivae and lids unremarkable. Cardiovascular: Regular rhythm.  Lungs: Normal work of breathing. Neurologic: No focal deficits.   Lab Results  Component Value Date   CREATININE 0.70 02/06/2021   BUN 14 02/06/2021   NA 147 (H) 02/06/2021   K 4.8 02/06/2021   CL 109 (H) 02/06/2021   CO2 22 02/06/2021   Lab Results  Component Value Date   ALT 23 02/06/2021   AST 19 02/06/2021   ALKPHOS 66 02/06/2021   BILITOT 0.6 02/06/2021   Lab Results  Component Value Date   HGBA1C 5.7 (H) 02/06/2021   HGBA1C 5.8 (H) 10/13/2020   Lab Results  Component Value Date   INSULIN 11.0 02/06/2021   INSULIN 11.4 10/13/2020   Lab Results  Component Value Date   TSH 1.070 10/13/2020   Lab Results  Component Value Date   CHOL 177 02/06/2021   HDL 61 02/06/2021   LDLCALC 95 02/06/2021   TRIG 120 02/06/2021   CHOLHDL 2.9 02/06/2021   Lab Results  Component Value Date   VD25OH 37.1 02/06/2021   VD25OH 13.0 (L) 10/13/2020   Lab Results  Component Value Date   WBC 5.4 10/13/2020   HGB 14.7 10/13/2020   HCT 43.5 10/13/2020   MCV 86 10/13/2020   PLT 308 10/13/2020    Attestation Statements:   Reviewed by clinician on day of visit: allergies, medications, problem list, medical history, surgical history, family history, social history, and previous encounter notes.  Coral Ceo, CMA, am acting as transcriptionist for [employer], DO.  I have reviewed the above documentation for accuracy  and completeness, and I agree with the above. Marjory Sneddon, D.O.  The [place] was signed into law in 2016 which includes the topic of electronic health records.  This provides immediate access to information in MyChart.  This includes consultation notes, operative notes, office notes, lab results and pathology reports.  If you have any questions about what you read please let us know at your next visit so we can discuss your concerns and take corrective action if need be.  We are right here with you.

## 2021-05-29 ENCOUNTER — Ambulatory Visit: Payer: 59 | Admitting: Dermatology

## 2021-05-29 ENCOUNTER — Other Ambulatory Visit: Payer: Self-pay

## 2021-05-29 ENCOUNTER — Encounter: Payer: Self-pay | Admitting: Dermatology

## 2021-05-29 DIAGNOSIS — D1800 Hemangioma unspecified site: Secondary | ICD-10-CM

## 2021-05-29 DIAGNOSIS — D485 Neoplasm of uncertain behavior of skin: Secondary | ICD-10-CM | POA: Diagnosis not present

## 2021-05-29 DIAGNOSIS — L814 Other melanin hyperpigmentation: Secondary | ICD-10-CM

## 2021-05-29 DIAGNOSIS — L918 Other hypertrophic disorders of the skin: Secondary | ICD-10-CM | POA: Diagnosis not present

## 2021-05-29 DIAGNOSIS — L738 Other specified follicular disorders: Secondary | ICD-10-CM

## 2021-05-29 DIAGNOSIS — Z1283 Encounter for screening for malignant neoplasm of skin: Secondary | ICD-10-CM | POA: Diagnosis not present

## 2021-05-29 DIAGNOSIS — D239 Other benign neoplasm of skin, unspecified: Secondary | ICD-10-CM

## 2021-05-29 DIAGNOSIS — D2371 Other benign neoplasm of skin of right lower limb, including hip: Secondary | ICD-10-CM

## 2021-06-05 DIAGNOSIS — Z8616 Personal history of COVID-19: Secondary | ICD-10-CM

## 2021-06-05 HISTORY — DX: Personal history of COVID-19: Z86.16

## 2021-06-09 ENCOUNTER — Encounter: Payer: Self-pay | Admitting: Dermatology

## 2021-06-09 NOTE — Addendum Note (Signed)
Addended by: Lavonna Monarch on: 06/09/2021 07:15 AM   Modules accepted: Level of Service

## 2021-06-09 NOTE — Progress Notes (Signed)
   Follow-Up Visit   Subjective  Lisa Wade is a 60 y.o. female who presents for the following: Annual Exam (Few spots she wants checked- back - x years- burns and itches ).  General skin examination, 1 lesion under left arm has grown Location:  Duration:  Quality:  Associated Signs/Symptoms: Modifying Factors:  Severity:  Timing: Context:   Objective  Well appearing patient in no apparent distress; mood and affect are within normal limits. Torso - Posterior (Back) Full body skin examination: No atypical pigmented lesions or nonmelanoma skin cancer.  Mid Forehead Two millimeter delled flesh-colored papule  Left Abdomen (side) - Lower Multiple 1 to 3 mm smooth red dermal papules  Right Lower Leg - Anterior Firm 4 mm pink dermal papule  Left Nasal Sidewall 3 mm monochrome light brown macule; dermoscopy shows no atypia  Left Axilla 5 mm polypoid flesh-colored papule    A full examination was performed including scalp, head, eyes, ears, nose, lips, neck, chest, axillae, abdomen, back, buttocks, bilateral upper extremities, bilateral lower extremities, hands, feet, fingers, toes, fingernails, and toenails. All findings within normal limits unless otherwise noted below.  Areas beneath undergarments not fully examined   Assessment & Plan    Encounter for screening for malignant neoplasm of skin Torso - Posterior (Back)  Annual skin examination, encouraged to self examine with spouse twice annually.  Continued ultraviolet protection.  Sebaceous hyperplasia of face Mid Forehead  No intervention required but told of similar appearance of early BCC so if there is growth or bleeding return for biopsy  Hemangioma, unspecified site Left Abdomen (side) - Lower  No intervention necessary  Dermatofibroma Right Lower Leg - Anterior  Leave if stable  Solar lentigo Left Nasal Sidewall  Leave if stable  Neoplasm of uncertain behavior of skin Left Axilla  Skin /  nail biopsy Type of biopsy: tangential   Informed consent: discussed and consent obtained   Timeout: patient name, date of birth, surgical site, and procedure verified   Anesthesia: the lesion was anesthetized in a standard fashion   Anesthetic:  1% lidocaine w/ epinephrine 1-100,000 local infiltration Instrument used: flexible razor blade   Hemostasis achieved with: aluminum chloride and electrodesiccation   Outcome: patient tolerated procedure well   Post-procedure details: wound care instructions given    Specimen 1 - Surgical pathology Differential Diagnosis: skin tag  Check Margins: No  Biopsy at patient's request.      I, Lavonna Monarch, MD, have reviewed all documentation for this visit.  The documentation on 06/09/21 for the exam, diagnosis, procedures, and orders are all accurate and complete.

## 2021-06-15 ENCOUNTER — Encounter: Payer: Self-pay | Admitting: Obstetrics and Gynecology

## 2021-06-15 ENCOUNTER — Other Ambulatory Visit (HOSPITAL_COMMUNITY)
Admission: RE | Admit: 2021-06-15 | Discharge: 2021-06-15 | Disposition: A | Discharge status: Home / Self Care | Payer: 59 | Source: Ambulatory Visit | Attending: Obstetrics and Gynecology | Admitting: Obstetrics and Gynecology

## 2021-06-15 ENCOUNTER — Ambulatory Visit (INDEPENDENT_AMBULATORY_CARE_PROVIDER_SITE_OTHER): Payer: 59 | Admitting: Obstetrics and Gynecology

## 2021-06-15 ENCOUNTER — Other Ambulatory Visit: Payer: Self-pay

## 2021-06-15 VITALS — BP 112/60 | HR 78 | Ht 63.0 in | Wt 186.0 lb

## 2021-06-15 DIAGNOSIS — Z01419 Encounter for gynecological examination (general) (routine) without abnormal findings: Secondary | ICD-10-CM | POA: Insufficient documentation

## 2021-06-15 MED ORDER — VALACYCLOVIR HCL 500 MG PO TABS: ORAL_TABLET | ORAL | 3 refills | Status: DC

## 2021-06-15 NOTE — Patient Instructions (Signed)

## 2021-06-15 NOTE — Progress Notes (Signed)
60 y.o. G0P0000 Married Caucasian female here for annual exam.    Had Covid.  Developed low back pain again and took ibuprofen and this relieved the pain.  Liked doing pelvic floor PT for her urinary incontinence.   No hot flashes until had Covid.  Doing ok with them.  PCP:  Derinda Late, MD   Patient's last menstrual period was 05/12/2020.           Sexually active: Yes.    The current method of family planning is tubal ligation.    Exercising: No.  The patient does not participate in regular exercise at present. Smoker:  no  Health Maintenance: Pap:  03-28-16 Neg:neg HR HPV, 02-04-14 Neg:Neg HR HPV History of abnormal Pap:  yes,  1991 Hx CIN 1 MMG:   08-17-19 3D/Implants/Neg/density C/BiRads1 Colonoscopy:  2015 normal;next due 2025 BMD:   n/a  Result  n/a TDaP:  PCP Gardasil:   no HIV: 03-28-16 NR Hep C: 03-28-16 Neg Screening Labs:  PCP   reports that she quit smoking about 10 years ago. Her smoking use included cigarettes. She has never used smokeless tobacco. She reports current alcohol use of about 2.0 standard drinks per week. She reports that she does not use drugs.  Past Medical History:  Diagnosis Date   Acid reflux    Anxiety    Back pain    Bladder prolapse, female, acquired    C. difficile colitis    Chondromalacia    Depression    Dysplasia of cervix, low grade (CIN 1) 1991   Fibroid    Fibroids    uterine   Heartburn    High cholesterol    History of COVID-19 06/05/2021   HSV-1 infection 2010   Lumbar spondylosis    Mild depression (HCC)    Palpitations    Plantar fasciitis, bilateral    Sleep apnea    SOBOE (shortness of breath on exertion)    Social anxiety disorder 1998   Swallowing difficulty    Swelling of both lower extremities    Vitamin D deficiency     Past Surgical History:  Procedure Laterality Date   APPENDECTOMY     BREAST ENHANCEMENT SURGERY  2008   [facility] Mares)   TUBAL LIGATION      Current Outpatient Medications   Medication Sig Dispense Refill   buPROPion (WELLBUTRIN) 75 MG tablet Take 1 tablet (75 mg total) by mouth in the morning. 30 tablet 0   chlorpheniramine-HYDROcodone (TUSSIONEX) 10-8 MG/5ML SUER SMARTSIG:1 Teaspoon By Mouth Every 12 Hours PRN     escitalopram (LEXAPRO) 20 MG tablet Take 20 mg by mouth daily.     neomycin-polymyxin-hydrocortisone (CORTISPORIN) 3.5-10000-1 OTIC suspension 4 drops 3 (three) times daily.     omeprazole (PRILOSEC) 20 MG capsule Take 20 mg by mouth as needed.     rosuvastatin (CRESTOR) 10 MG tablet SMARTSIG:1 Tablet(s) By Mouth Every Evening     triamcinolone cream (KENALOG) 0.1 % APPLY TO RASH ON HANDS TWICE DAILY AS NEEDED  2   valACYclovir (VALTREX) 500 MG tablet Take one tablet (500 mg) by mouth daily.  Take one tablet (500 mg) by mouth twice daily for an outbreak. 100 tablet 3   Vitamin D, Ergocalciferol, (DRISDOL) 1.25 MG (50000 UNIT) CAPS capsule Take 1 capsule (50,000 Units total) by mouth every 7 (seven) days. 4 capsule 0   No current facility-administered medications for this visit.    Family History  Problem Relation Age of Onset   Cancer Father  lung   Thyroid disease Father    Cancer Brother        testicular cancer   Osteoarthritis Mother    Migraines Mother    Thyroid disease Mother    Depression Mother    Anxiety disorder Mother    Obesity Mother    Osteoarthritis Maternal Grandmother    Hypertension Paternal Grandmother    Stroke Paternal Grandmother     Review of Systems  Musculoskeletal:  Positive for back pain (denies any urinary symptoms).  All other systems reviewed and are negative.  Exam:   BP 112/60   Pulse 78   Ht '5\' 3"'$  (1.6 m)   Wt 186 lb (84.4 kg)   LMP 05/12/2020   SpO2 99%   BMI 32.95 kg/m     General appearance: alert, cooperative and appears stated age Head: normocephalic, without obvious abnormality, atraumatic Neck: no adenopathy, supple, symmetrical, trachea midline and thyroid normal to inspection  and palpation Lungs: clear to auscultation bilaterally Breasts: normal appearance, bilateral augmentation, no masses or tenderness, No nipple retraction or dimpling, No nipple discharge or bleeding, No axillary adenopathy Heart: regular rate and rhythm Abdomen: soft, non-tender; no masses, no organomegaly Extremities: extremities normal, atraumatic, no cyanosis or edema Skin: skin color, texture, turgor normal. No rashes or lesions Lymph nodes: cervical, supraclavicular, and axillary nodes normal. Neurologic: grossly normal  Pelvic: External genitalia:  no lesions              No abnormal inguinal nodes palpated.              Urethra:  normal appearing urethra with no masses, tenderness or lesions              Bartholins and Skenes: normal                 Vagina: normal appearing vagina with normal color and discharge, no lesions              Cervix: no lesions              Pap taken: yes Bimanual Exam:  Uterus: 8 week size with posterior fibroid formation.              Adnexa: no mass, fullness, tenderness              Rectal exam: yes.  Confirms.              Anus:  normal sphincter tone, no lesions  Chaperone was present for exam:  Estill Bamberg, CMA  Assessment:   Well woman visit with gynecologic exam. Postmenopausal female. Status post BTL.  Stress incontinence.  Uterine fibroids.  Hx HSV I.   Status post bilateral breast implants.   Plan: Mammogram screening discussed.  She will schedule. Self breast awareness reviewed. Pap and HR HPV as above. Call for any future vaginal bleedin.  Guidelines for Calcium, Vitamin D, regular exercise program including cardiovascular and weight bearing exercise. Refill of Valtrex.  Follow up annually and prn.   After visit summary provided.

## 2021-06-20 LAB — CYTOLOGY - PAP
Comment: NEGATIVE
Diagnosis: NEGATIVE
High risk HPV: NEGATIVE

## 2021-06-21 ENCOUNTER — Other Ambulatory Visit: Payer: Self-pay

## 2021-06-21 ENCOUNTER — Encounter (INDEPENDENT_AMBULATORY_CARE_PROVIDER_SITE_OTHER): Payer: Self-pay | Admitting: Family Medicine

## 2021-06-21 ENCOUNTER — Ambulatory Visit (INDEPENDENT_AMBULATORY_CARE_PROVIDER_SITE_OTHER): Payer: 59 | Admitting: Family Medicine

## 2021-06-21 VITALS — BP 103/68 | HR 78 | Temp 97.8°F | Ht 63.0 in | Wt 180.0 lb

## 2021-06-21 DIAGNOSIS — Z9189 Other specified personal risk factors, not elsewhere classified: Secondary | ICD-10-CM | POA: Diagnosis not present

## 2021-06-21 DIAGNOSIS — F39 Unspecified mood [affective] disorder: Secondary | ICD-10-CM | POA: Diagnosis not present

## 2021-06-21 DIAGNOSIS — E669 Obesity, unspecified: Secondary | ICD-10-CM

## 2021-06-21 DIAGNOSIS — Z6831 Body mass index (BMI) 31.0-31.9, adult: Secondary | ICD-10-CM | POA: Diagnosis not present

## 2021-06-21 DIAGNOSIS — E559 Vitamin D deficiency, unspecified: Secondary | ICD-10-CM

## 2021-06-21 MED ORDER — BUPROPION HCL ER (SMOKING DET) 150 MG PO TB12: ORAL_TABLET | ORAL | 0 refills | Status: DC

## 2021-06-21 MED ORDER — VITAMIN D (ERGOCALCIFEROL) 1.25 MG (50000 UNIT) PO CAPS: 50000.0000 [IU] | ORAL_CAPSULE | ORAL | 0 refills | Status: DC

## 2021-06-22 NOTE — Progress Notes (Signed)
Chief Complaint:   OBESITY Lisa Wade is here to discuss her progress with her obesity treatment plan along with follow-up of her obesity related diagnoses. Lisa Wade is on the Category 2 Plan and states she is following her eating plan approximately 25% of the time. Lisa Wade states she is not currently exercising.  Today's visit was #: 13 Starting weight: 185 lbs Starting date: 10/13/2020 Today's weight: 180 lbs Today's date: 06/21/2021 Total lbs lost to date: 5 Total lbs lost since last in-office visit: +2  Interim History: Lisa Wade has eaten junk lately, like lice cream and jello. She is also skipping meals due to being busy at work.  Subjective:   1. Mood disorder, with emotional eating Lisa Wade is experiencing emotional eating behaviors.  2. Vitamin D deficiency She is currently taking prescription vitamin D 50,000 IU each week. She denies nausea, vomiting or muscle weakness.  Lab Results  Component Value Date   VD25OH 37.1 02/06/2021   VD25OH 13.0 (L) 10/13/2020   3. At risk for side effect of medication Lisa Wade is at risk for side effects of medication due to increasing dose of bupropion.  Assessment/Plan:  No orders of the defined types were placed in this encounter.   Medications Discontinued During This Encounter  Medication Reason   buPROPion (WELLBUTRIN) 75 MG tablet    Vitamin D, Ergocalciferol, (DRISDOL) 1.25 MG (50000 UNIT) CAPS capsule Reorder     Meds ordered this encounter  Medications   Vitamin D, Ergocalciferol, (DRISDOL) 1.25 MG (50000 UNIT) CAPS capsule    Sig: Take 1 capsule (50,000 Units total) by mouth every 7 (seven) days.    Dispense:  4 capsule    Refill:  0    Ov for rf   buPROPion (ZYBAN) 150 MG 12 hr tablet    Sig: 1 po q am    Dispense:  30 tablet    Refill:  0    30 d supply;  ** OV for RF **   Do not send RF request     1. Mood disorder, with emotional eating Behavior modification techniques were discussed today to help Lisa Wade deal with her  emotional/non-hunger eating behaviors.  Orders and follow up as documented in patient record. Increase Wellbutrin from 75 mg QD to 150 mg QD.  Increase and Refill- buPROPion (ZYBAN) 150 MG 12 hr tablet; 1 po q am  Dispense: 30 tablet; Refill: 0  2. Vitamin D deficiency Low Vitamin D level contributes to fatigue and are associated with obesity, breast, and colon cancer. She agrees to continue to take prescription Vitamin D 50,000 IU every week and will follow-up for routine testing of Vitamin D, at least 2-3 times per year to avoid over-replacement.  Refill- Vitamin D, Ergocalciferol, (DRISDOL) 1.25 MG (50000 UNIT) CAPS capsule; Take 1 capsule (50,000 Units total) by mouth every 7 (seven) days.  Dispense: 4 capsule; Refill: 0  3. At risk for side effect of medication Due to Lisa Wade's current conditions and medications, she is at a higher risk for drug side effect.  At least 9 minutes was spent on counseling her about these concerns today.  We discussed the benefits and potential risks of these medications, and all of patient's concerns were addressed and questions were answered.  she will call us, or their PCP or other specialists who treat their conditions with medications, with any questions or concerns that may develop.    4. Obesity with current BMI of 31.0  Lisa Wade is currently in the action stage  of change. As such, her goal is to continue with weight loss efforts. She has agreed to the Category 2 Plan with 4-6 oz protein at lunch and 8-10 oz protein at dinner with breakfast and lunch options.   Exercise goals:  As is  Behavioral modification strategies: no skipping meals and planning for success. Discussed better self-care with pt!  Lisa Wade has agreed to follow-up with our clinic in 2-3 weeks. She was informed of the importance of frequent follow-up visits to maximize her success with intensive lifestyle modifications for her multiple health conditions.   Objective:   Blood pressure 103/68,  pulse 78, temperature 97.8 F (36.6 C), height '5\' 3"'$  (1.6 m), weight 180 lb (81.6 kg), last menstrual period 05/12/2020, SpO2 96 %. Body mass index is 31.89 kg/m.  General: Cooperative, alert, well developed, in no acute distress. HEENT: Conjunctivae and lids unremarkable. Cardiovascular: Regular rhythm.  Lungs: Normal work of breathing. Neurologic: No focal deficits.   Lab Results  Component Value Date   CREATININE 0.70 02/06/2021   BUN 14 02/06/2021   NA 147 (H) 02/06/2021   K 4.8 02/06/2021   CL 109 (H) 02/06/2021   CO2 22 02/06/2021   Lab Results  Component Value Date   ALT 23 02/06/2021   AST 19 02/06/2021   ALKPHOS 66 02/06/2021   BILITOT 0.6 02/06/2021   Lab Results  Component Value Date   HGBA1C 5.7 (H) 02/06/2021   HGBA1C 5.8 (H) 10/13/2020   Lab Results  Component Value Date   INSULIN 11.0 02/06/2021   INSULIN 11.4 10/13/2020   Lab Results  Component Value Date   TSH 1.070 10/13/2020   Lab Results  Component Value Date   CHOL 177 02/06/2021   HDL 61 02/06/2021   LDLCALC 95 02/06/2021   TRIG 120 02/06/2021   CHOLHDL 2.9 02/06/2021   Lab Results  Component Value Date   VD25OH 37.1 02/06/2021   VD25OH 13.0 (L) 10/13/2020   Lab Results  Component Value Date   WBC 5.4 10/13/2020   HGB 14.7 10/13/2020   HCT 43.5 10/13/2020   MCV 86 10/13/2020   PLT 308 10/13/2020    Attestation Statements:   Reviewed by clinician on day of visit: allergies, medications, problem list, medical history, surgical history, family history, social history, and previous encounter notes.  Coral Ceo, CMA, am acting as transcriptionist for [employer], DO.  I have reviewed the above documentation for accuracy and completeness, and I agree with the above. Marjory Sneddon, D.O.  The [place] was signed into law in 2016 which includes the topic of electronic health records.  This provides immediate access to information in MyChart.  This  includes consultation notes, operative notes, office notes, lab results and pathology reports.  If you have any questions about what you read please let us know at your next visit so we can discuss your concerns and take corrective action if need be.  We are right here with you.

## 2021-07-11 ENCOUNTER — Other Ambulatory Visit: Payer: Self-pay

## 2021-07-11 ENCOUNTER — Encounter (INDEPENDENT_AMBULATORY_CARE_PROVIDER_SITE_OTHER): Payer: Self-pay | Admitting: Family Medicine

## 2021-07-11 ENCOUNTER — Ambulatory Visit (INDEPENDENT_AMBULATORY_CARE_PROVIDER_SITE_OTHER): Payer: 59 | Admitting: Family Medicine

## 2021-07-11 VITALS — BP 98/64 | HR 78 | Temp 97.7°F | Ht 63.0 in | Wt 180.0 lb

## 2021-07-11 DIAGNOSIS — E559 Vitamin D deficiency, unspecified: Secondary | ICD-10-CM

## 2021-07-11 DIAGNOSIS — F39 Unspecified mood [affective] disorder: Secondary | ICD-10-CM

## 2021-07-11 DIAGNOSIS — E669 Obesity, unspecified: Secondary | ICD-10-CM | POA: Diagnosis not present

## 2021-07-11 DIAGNOSIS — Z6831 Body mass index (BMI) 31.0-31.9, adult: Secondary | ICD-10-CM

## 2021-07-11 DIAGNOSIS — Z9189 Other specified personal risk factors, not elsewhere classified: Secondary | ICD-10-CM | POA: Diagnosis not present

## 2021-07-11 MED ORDER — VITAMIN D (ERGOCALCIFEROL) 1.25 MG (50000 UNIT) PO CAPS: 50000.0000 [IU] | ORAL_CAPSULE | ORAL | 0 refills | Status: DC

## 2021-07-11 MED ORDER — BUPROPION HCL ER (SR) 150 MG PO TB12
150.0000 mg | ORAL_TABLET | Freq: Every day | ORAL | 0 refills | Status: DC
Start: 2021-07-11 — End: 2021-08-07

## 2021-07-11 NOTE — Progress Notes (Signed)
Chief Complaint:   OBESITY Lisa Wade is here to discuss her progress with her obesity treatment plan along with follow-up of her obesity related diagnoses. Lisa Wade is on the Category 2 Plan with 4-6 oz of protein at lunch and 8-10 oz of protein at dinner, with breakfast and lunch options and states she is following her eating plan approximately 50% of the time. Lisa Wade states she is walking for 30 minutes 1 time per week.  Today's visit was #: 14 Starting weight: 185 lbs Starting date: 10/13/2020 Today's weight: 180 lbs Today's date: 07/11/2021 Total lbs lost to date: 5 Total lbs lost since last in-office visit: 0  Interim History: Lisa Wade is trying to eat more and not skip meals as much, and she is doing a little better with that.  She is eating lunch more often, but she is eating [place] [employer] subs 3 days per week usually.  She is not able to follow the meal plan, and she knows she needs to change to journaling to be more accountable.  Subjective:   1. Vitamin D deficiency Lisa Wade is currently taking prescription vitamin D 50,000 IU each week. She denies nausea, vomiting or muscle weakness.  2. Mood disorder, with emotional eating We increased Lisa Wade's Wellbutrin from 75 mg to 150 mg q daily.  3. At risk for deficient intake of food Lisa Wade is at a higher than average risk of deficient intake of food due to inadequate intake still.   Assessment/Plan:  No orders of the defined types were placed in this encounter.   Medications Discontinued During This Encounter  Medication Reason   Vitamin D, Ergocalciferol, (DRISDOL) 1.25 MG (50000 UNIT) CAPS capsule Reorder   buPROPion (ZYBAN) 150 MG 12 hr tablet      Meds ordered this encounter  Medications   Vitamin D, Ergocalciferol, (DRISDOL) 1.25 MG (50000 UNIT) CAPS capsule    Sig: Take 1 capsule (50,000 Units total) by mouth every 7 (seven) days.    Dispense:  4 capsule    Refill:  0    Ov for rf   buPROPion (WELLBUTRIN SR) 150 MG 12 hr tablet     Sig: Take 1 tablet (150 mg total) by mouth daily.    Dispense:  30 tablet    Refill:  0    30 d supply;  ** OV for RF **   Do not send RF request     1. Vitamin D deficiency Low Vitamin D level contributes to fatigue and are associated with obesity, breast, and colon cancer. We will refill prescription Vitamin D 50,000 IU every week for 1 month. Dannya will follow-up for routine testing of Vitamin D, at least 2-3 times per year to avoid over-replacement.  - Vitamin D, Ergocalciferol, (DRISDOL) 1.25 MG (50000 UNIT) CAPS capsule; Take 1 capsule (50,000 Units total) by mouth every 7 (seven) days.  Dispense: 4 capsule; Refill: 0  2. Mood disorder, with emotional eating Behavior modification techniques were discussed today to help Lisa Wade deal with her emotional/non-hunger eating behaviors. Lisa Wade will continue Lexapro, and we will refill Wellbutrin SR for 1 month. Orders and follow up as documented in patient record.   - buPROPion (WELLBUTRIN SR) 150 MG 12 hr tablet; Take 1 tablet (150 mg total) by mouth daily.  Dispense: 30 tablet; Refill: 0  3. At risk for deficient intake of food Lisa Wade was given approximately 9 minutes of deficit intake of food prevention counseling today. Lisa Wade is at risk for eating too few calories based  on current food recall. She was encouraged to focus on meeting caloric and protein goals according to her recommended meal plan.   4. Obesity with current BMI of 31.9 Lisa Wade is currently in the action stage of change. As such, her goal is to continue with weight loss efforts. She has agreed to change to keeping a food journal and adhering to recommended goals of 1500-1600 calories and 100+ grams of protein daily.   Exercise goals: For substantial health benefits, adults should do at least 150 minutes (2 hours and 30 minutes) a week of moderate-intensity, or 75 minutes (1 hour and 15 minutes) a week of vigorous-intensity aerobic physical activity, or an equivalent combination of  moderate- and vigorous-intensity aerobic activity. Aerobic activity should be performed in episodes of at least 10 minutes, and preferably, it should be spread throughout the week. Increase as tolerated.  Behavioral modification strategies: planning for success and keeping a strict food journal.  Lisa Wade has agreed to follow-up with our clinic in 2 to 3 weeks. She was informed of the importance of frequent follow-up visits to maximize her success with intensive lifestyle modifications for her multiple health conditions.   Objective:   Blood pressure 98/64, pulse 78, temperature 97.7 F (36.5 C), height 5\' 3"  (1.6 m), weight 180 lb (81.6 kg), last menstrual period 05/12/2020, SpO2 96 %. Body mass index is 31.89 kg/m.  General: Cooperative, alert, well developed, in no acute distress. HEENT: Conjunctivae and lids unremarkable. Cardiovascular: Regular rhythm.  Lungs: Normal work of breathing. Neurologic: No focal deficits.   Lab Results  Component Value Date   CREATININE 0.70 02/06/2021   BUN 14 02/06/2021   NA 147 (H) 02/06/2021   K 4.8 02/06/2021   CL 109 (H) 02/06/2021   CO2 22 02/06/2021   Lab Results  Component Value Date   ALT 23 02/06/2021   AST 19 02/06/2021   ALKPHOS 66 02/06/2021   BILITOT 0.6 02/06/2021   Lab Results  Component Value Date   HGBA1C 5.7 (H) 02/06/2021   HGBA1C 5.8 (H) 10/13/2020   Lab Results  Component Value Date   INSULIN 11.0 02/06/2021   INSULIN 11.4 10/13/2020   Lab Results  Component Value Date   TSH 1.070 10/13/2020   Lab Results  Component Value Date   CHOL 177 02/06/2021   HDL 61 02/06/2021   LDLCALC 95 02/06/2021   TRIG 120 02/06/2021   CHOLHDL 2.9 02/06/2021   Lab Results  Component Value Date   VD25OH 37.1 02/06/2021   VD25OH 13.0 (L) 10/13/2020   Lab Results  Component Value Date   WBC 5.4 10/13/2020   HGB 14.7 10/13/2020   HCT 43.5 10/13/2020   MCV 86 10/13/2020   PLT 308 10/13/2020   No results found for: IRON,  TIBC, FERRITIN  Attestation Statements:   Reviewed by clinician on day of visit: allergies, medications, problem list, medical history, surgical history, family history, social history, and previous encounter notes.   Wilhemena Durie, am acting as transcriptionist for [employer], DO.  I have reviewed the above documentation for accuracy and completeness, and I agree with the above. Marjory Sneddon, D.O.  The [place] was signed into law in 2016 which includes the topic of electronic health records.  This provides immediate access to information in MyChart.  This includes consultation notes, operative notes, office notes, lab results and pathology reports.  If you have any questions about what you read please let us know at  your next visit so we can discuss your concerns and take corrective action if need be.  We are right here with you.

## 2021-07-12 ENCOUNTER — Other Ambulatory Visit: Payer: Self-pay | Admitting: Obstetrics and Gynecology

## 2021-07-12 DIAGNOSIS — Z1231 Encounter for screening mammogram for malignant neoplasm of breast: Secondary | ICD-10-CM

## 2021-07-21 ENCOUNTER — Other Ambulatory Visit (INDEPENDENT_AMBULATORY_CARE_PROVIDER_SITE_OTHER): Payer: Self-pay | Admitting: Family Medicine

## 2021-07-21 DIAGNOSIS — F39 Unspecified mood [affective] disorder: Secondary | ICD-10-CM

## 2021-07-24 ENCOUNTER — Encounter (INDEPENDENT_AMBULATORY_CARE_PROVIDER_SITE_OTHER): Payer: Self-pay | Admitting: Family Medicine

## 2021-07-24 ENCOUNTER — Other Ambulatory Visit: Payer: Self-pay

## 2021-07-24 ENCOUNTER — Ambulatory Visit (INDEPENDENT_AMBULATORY_CARE_PROVIDER_SITE_OTHER): Payer: 59 | Admitting: Family Medicine

## 2021-07-24 VITALS — BP 102/67 | HR 69 | Temp 98.0°F | Ht 63.0 in | Wt 183.0 lb

## 2021-07-24 DIAGNOSIS — E7849 Other hyperlipidemia: Secondary | ICD-10-CM

## 2021-07-24 DIAGNOSIS — Z6831 Body mass index (BMI) 31.0-31.9, adult: Secondary | ICD-10-CM

## 2021-07-24 DIAGNOSIS — F439 Reaction to severe stress, unspecified: Secondary | ICD-10-CM | POA: Diagnosis not present

## 2021-07-24 DIAGNOSIS — E669 Obesity, unspecified: Secondary | ICD-10-CM | POA: Diagnosis not present

## 2021-07-24 NOTE — Progress Notes (Signed)
Chief Complaint:   OBESITY Lisa Wade is here to discuss her progress with her obesity treatment plan along with follow-up of her obesity related diagnoses. Lisa Wade is on keeping a food journal and adhering to recommended goals of 1500-1600 calories and 100+ grams of protein daily and states she is following her eating plan approximately 50% of the time. Lisa Wade states she is doing 0 minutes 0 times per week.  Today's visit was #: 15 Starting weight: 185 lbs Starting date: 10/13/2020 Today's weight: 182 lbs Today's date: 07/24/2021 Total lbs lost to date: 3 Total lbs lost since last in-office visit: 0  Interim History: Lisa Wade has been on vacation and has tried to be mindful. She has started back to journaling, but struggles to meet her protein goals.   Subjective:   1. Other hyperlipidemia Lisa Wade is stable on Crestor, and she is working on diet or weight loss.  2. Stress Lisa Wade has increased stress with her family recently, and this has affected her sleep which can affect her weight loss efforts.  Assessment/Plan:   1. Other hyperlipidemia Cardiovascular risk and specific lipid/LDL goals reviewed. We discussed several lifestyle modifications today. Lisa Wade will continue to work on diet, exercise and weight loss efforts. Low cholesterol foods were discussed. Orders and follow up as documented in patient record.   2. Stress Lisa Wade is to be mindful, and will continue to monitor.  3. Obesity with current BMI of 32.3 Lisa Wade is currently in the action stage of change. As such, her goal is to continue with weight loss efforts. She has agreed to keeping a food journal and adhering to recommended goals of 1500-1600 calories and 100+ grams of protein daily.   Protein rich recipes were discussed. Lisa Wade was educated about the 10:1 protein ratio guideline.   Behavioral modification strategies: increasing lean protein intake.  Lisa Wade has agreed to follow-up with our clinic in 2 to 3 weeks. She was informed of the  importance of frequent follow-up visits to maximize her success with intensive lifestyle modifications for her multiple health conditions.   Objective:   Blood pressure 102/67, pulse 69, temperature 98 F (36.7 C), height 5\' 3"  (1.6 m), weight 183 lb (83 kg), last menstrual period 05/12/2020, SpO2 97 %. Body mass index is 32.42 kg/m.  General: Cooperative, alert, well developed, in no acute distress. HEENT: Conjunctivae and lids unremarkable. Cardiovascular: Regular rhythm.  Lungs: Normal work of breathing. Neurologic: No focal deficits.   Lab Results  Component Value Date   CREATININE 0.70 02/06/2021   BUN 14 02/06/2021   NA 147 (H) 02/06/2021   K 4.8 02/06/2021   CL 109 (H) 02/06/2021   CO2 22 02/06/2021   Lab Results  Component Value Date   ALT 23 02/06/2021   AST 19 02/06/2021   ALKPHOS 66 02/06/2021   BILITOT 0.6 02/06/2021   Lab Results  Component Value Date   HGBA1C 5.7 (H) 02/06/2021   HGBA1C 5.8 (H) 10/13/2020   Lab Results  Component Value Date   INSULIN 11.0 02/06/2021   INSULIN 11.4 10/13/2020   Lab Results  Component Value Date   TSH 1.070 10/13/2020   Lab Results  Component Value Date   CHOL 177 02/06/2021   HDL 61 02/06/2021   LDLCALC 95 02/06/2021   TRIG 120 02/06/2021   CHOLHDL 2.9 02/06/2021   Lab Results  Component Value Date   VD25OH 37.1 02/06/2021   VD25OH 13.0 (L) 10/13/2020   Lab Results  Component Value Date  WBC 5.4 10/13/2020   HGB 14.7 10/13/2020   HCT 43.5 10/13/2020   MCV 86 10/13/2020   PLT 308 10/13/2020   No results found for: IRON, TIBC, FERRITIN  Attestation Statements:   Reviewed by clinician on day of visit: allergies, medications, problem list, medical history, surgical history, family history, social history, and previous encounter notes.  Time spent on visit including pre-visit chart review and post-visit care and charting was 32 minutes.    I, Trixie Dredge, am acting as transcriptionist for Dennard Nip, MD.  I have reviewed the above documentation for accuracy and completeness, and I agree with the above. -  Dennard Nip, MD

## 2021-08-07 ENCOUNTER — Encounter (INDEPENDENT_AMBULATORY_CARE_PROVIDER_SITE_OTHER): Payer: Self-pay | Admitting: Family Medicine

## 2021-08-07 ENCOUNTER — Ambulatory Visit (INDEPENDENT_AMBULATORY_CARE_PROVIDER_SITE_OTHER): Payer: 59 | Admitting: Family Medicine

## 2021-08-07 ENCOUNTER — Other Ambulatory Visit: Payer: Self-pay

## 2021-08-07 VITALS — BP 101/68 | Ht 63.0 in | Wt 183.0 lb

## 2021-08-07 DIAGNOSIS — Z9189 Other specified personal risk factors, not elsewhere classified: Secondary | ICD-10-CM | POA: Diagnosis not present

## 2021-08-07 DIAGNOSIS — E559 Vitamin D deficiency, unspecified: Secondary | ICD-10-CM

## 2021-08-07 DIAGNOSIS — R140 Abdominal distension (gaseous): Secondary | ICD-10-CM | POA: Diagnosis not present

## 2021-08-07 DIAGNOSIS — F39 Unspecified mood [affective] disorder: Secondary | ICD-10-CM | POA: Diagnosis not present

## 2021-08-07 DIAGNOSIS — R7303 Prediabetes: Secondary | ICD-10-CM | POA: Diagnosis not present

## 2021-08-07 DIAGNOSIS — E669 Obesity, unspecified: Secondary | ICD-10-CM

## 2021-08-07 DIAGNOSIS — Z6831 Body mass index (BMI) 31.0-31.9, adult: Secondary | ICD-10-CM

## 2021-08-07 MED ORDER — VITAMIN D (ERGOCALCIFEROL) 1.25 MG (50000 UNIT) PO CAPS: 50000.0000 [IU] | ORAL_CAPSULE | ORAL | 0 refills | Status: DC

## 2021-08-07 MED ORDER — BUPROPION HCL ER (SR) 150 MG PO TB12: 150.0000 mg | ORAL_TABLET | Freq: Every day | ORAL | 0 refills | Status: DC

## 2021-08-07 NOTE — Progress Notes (Signed)
Chief Complaint:   OBESITY Lisa Wade is here to discuss her progress with her obesity treatment plan along with follow-up of her obesity related diagnoses. Lisa Wade is on keeping a food journal and adhering to recommended goals of 1500-1600 calories and 100 grams protein and states she is following her eating plan approximately 50% of the time. Lisa Wade states she is walking 25 minutes 1-2 times per week.  Today's visit was #: 70 Starting weight: 185 lbs Starting date: 10/13/2020 Today's weight: 183 lbs Today's date: 08/07/2021 Total lbs lost to date: 2 Total lbs lost since last in-office visit: +1  Interim History: Lisa Wade's last OV was with Dr. Leafy Ro. She did not journal everyday, but the days she did, she did not hit her protein goals.she started walking with her husband last week.  Subjective:   1. Pre-diabetes Lisa Wade's A1c was 5.7 6 months ago. She denies cravings but is stress eating from time to time, especially when she skips meals. She makes bad choices.   2. Abdominal bloating When pt eats off plan, she gets bloated and has changes to stool. She wonders if she has a gluten sensitivity.  3. Mood disorder, with emotional eating Symptoms well controlled currently.  4. Vitamin D deficiency She is currently taking prescription vitamin D 50,000 IU each week. She denies nausea, vomiting or muscle weakness.  5. At risk for malnutrition Lisa Wade is at risk for malnutrition due to too many carbs and too little proteins and not enough fruit and vegetables.  Assessment/Plan:  No orders of the defined types were placed in this encounter.   Medications Discontinued During This Encounter  Medication Reason   Vitamin D, Ergocalciferol, (DRISDOL) 1.25 MG (50000 UNIT) CAPS capsule Reorder   buPROPion (WELLBUTRIN SR) 150 MG 12 hr tablet Reorder     Meds ordered this encounter  Medications   buPROPion (WELLBUTRIN SR) 150 MG 12 hr tablet    Sig: Take 1 tablet (150 mg total) by mouth daily.     Dispense:  30 tablet    Refill:  0    30 d supply;  ** OV for RF **   Do not send RF request   Vitamin D, Ergocalciferol, (DRISDOL) 1.25 MG (50000 UNIT) CAPS capsule    Sig: Take 1 capsule (50,000 Units total) by mouth every 7 (seven) days.    Dispense:  4 capsule    Refill:  0    Ov for rf     1. Pre-diabetes Pt recently had labs with PCP. She declines labwork today and will bring all results to next OV. Can consider meds in the future.  2. Abdominal bloating Pt will discuss her GI symptoms with her PCP at her upcoming OV but follow prudent nutritional plan and decrease simple carbs.  3. Mood disorder, with emotional eating Behavior modification techniques were discussed today to help Lisa Wade deal with her emotional/non-hunger eating behaviors.  Orders and follow up as documented in patient record.   Refill- buPROPion (WELLBUTRIN SR) 150 MG 12 hr tablet; Take 1 tablet (150 mg total) by mouth daily.  Dispense: 30 tablet; Refill: 0  4. Vitamin D deficiency Low Vitamin D level contributes to fatigue and are associated with obesity, breast, and colon cancer. She agrees to continue to take prescription Vitamin D 50,000 IU every week and will follow-up for routine testing of Vitamin D, at least 2-3 times per year to avoid over-replacement. If Vit D level was not checked through recent labs with PCP, we will  check at next OV.  Refill- Vitamin D, Ergocalciferol, (DRISDOL) 1.25 MG (50000 UNIT) CAPS capsule; Take 1 capsule (50,000 Units total) by mouth every 7 (seven) days.  Dispense: 4 capsule; Refill: 0  5. At risk for malnutrition Lisa Wade was given approximately 9 minutes of counseling today regarding prevention of malnutrition and ways to meet macronutrient goals..   6. Class 1 obesity with serious comorbidity and body mass index (BMI) of 31.0 to 31.9 in adult, unspecified obesity type  Lisa Wade is currently in the action stage of change. As such, her goal is to continue with weight loss efforts. She  has agreed to keeping a food journal and adhering to recommended goals of 1500-1600 calories and 100 grams protein.   Log using daily food journal and bring paper to next OV. Do not skip meals. Increase proteins and decrease simple carbs. Bring in all lab results from PCP office.  Exercise goals: For substantial health benefits, adults should do at least 150 minutes (2 hours and 30 minutes) a week of moderate-intensity, or 75 minutes (1 hour and 15 minutes) a week of vigorous-intensity aerobic physical activity, or an equivalent combination of moderate- and vigorous-intensity aerobic activity. Aerobic activity should be performed in episodes of at least 10 minutes, and preferably, it should be spread throughout the week.  Behavioral modification strategies: meal planning and cooking strategies and planning for success.  Lisa Wade has agreed to follow-up with our clinic in 2 weeks. She was informed of the importance of frequent follow-up visits to maximize her success with intensive lifestyle modifications for her multiple health conditions.   Objective:   Blood pressure 101/68, height 5\' 3"  (1.6 m), weight 183 lb (83 kg), last menstrual period 05/12/2020. Body mass index is 32.42 kg/m.  General: Cooperative, alert, well developed, in no acute distress. HEENT: Conjunctivae and lids unremarkable. Cardiovascular: Regular rhythm.  Lungs: Normal work of breathing. Neurologic: No focal deficits.   Lab Results  Component Value Date   CREATININE 0.70 02/06/2021   BUN 14 02/06/2021   NA 147 (H) 02/06/2021   K 4.8 02/06/2021   CL 109 (H) 02/06/2021   CO2 22 02/06/2021   Lab Results  Component Value Date   ALT 23 02/06/2021   AST 19 02/06/2021   ALKPHOS 66 02/06/2021   BILITOT 0.6 02/06/2021   Lab Results  Component Value Date   HGBA1C 5.7 (H) 02/06/2021   HGBA1C 5.8 (H) 10/13/2020   Lab Results  Component Value Date   INSULIN 11.0 02/06/2021   INSULIN 11.4 10/13/2020   Lab Results   Component Value Date   TSH 1.070 10/13/2020   Lab Results  Component Value Date   CHOL 177 02/06/2021   HDL 61 02/06/2021   LDLCALC 95 02/06/2021   TRIG 120 02/06/2021   CHOLHDL 2.9 02/06/2021   Lab Results  Component Value Date   VD25OH 37.1 02/06/2021   VD25OH 13.0 (L) 10/13/2020   Lab Results  Component Value Date   WBC 5.4 10/13/2020   HGB 14.7 10/13/2020   HCT 43.5 10/13/2020   MCV 86 10/13/2020   PLT 308 10/13/2020    Attestation Statements:   Reviewed by clinician on day of visit: allergies, medications, problem list, medical history, surgical history, family history, social history, and previous encounter notes.  Coral Ceo, CMA, am acting as transcriptionist for [employer], DO.  I have reviewed the above documentation for accuracy and completeness, and I agree with the above. Marjory Sneddon, D.O.  The [place] was signed into law in 2016 which includes the topic of electronic health records.  This provides immediate access to information in MyChart.  This includes consultation notes, operative notes, office notes, lab results and pathology reports.  If you have any questions about what you read please let us know at your next visit so we can discuss your concerns and take corrective action if need be.  We are right here with you.

## 2021-08-08 DIAGNOSIS — N8501 Benign endometrial hyperplasia: Secondary | ICD-10-CM

## 2021-08-08 HISTORY — DX: Benign endometrial hyperplasia: N85.01

## 2021-08-12 DIAGNOSIS — U071 COVID-19: Secondary | ICD-10-CM

## 2021-08-12 HISTORY — DX: COVID-19: U07.1

## 2021-08-21 ENCOUNTER — Telehealth: Payer: Self-pay

## 2021-08-21 ENCOUNTER — Other Ambulatory Visit: Payer: Self-pay

## 2021-08-21 ENCOUNTER — Ambulatory Visit
Admission: RE | Admit: 2021-08-21 | Discharge: 2021-08-21 | Disposition: A | Discharge status: Home / Self Care | Payer: 59 | Source: Ambulatory Visit | Attending: Obstetrics and Gynecology | Admitting: Obstetrics and Gynecology

## 2021-08-21 ENCOUNTER — Ambulatory Visit (INDEPENDENT_AMBULATORY_CARE_PROVIDER_SITE_OTHER): Payer: 59 | Admitting: Family Medicine

## 2021-08-21 DIAGNOSIS — Z1231 Encounter for screening mammogram for malignant neoplasm of breast: Secondary | ICD-10-CM

## 2021-08-21 DIAGNOSIS — N950 Postmenopausal bleeding: Secondary | ICD-10-CM

## 2021-08-21 NOTE — Telephone Encounter (Signed)
Per Carmelina Dane patient is scheduled for u/s and endo biopsy tomorrow 08/22/21.

## 2021-08-21 NOTE — Telephone Encounter (Signed)
Left message to call.

## 2021-08-21 NOTE — Telephone Encounter (Signed)
Patient is postmenopausal with no bleeding x 13 months.  She called to report that she is having some light spotting.

## 2021-08-21 NOTE — Telephone Encounter (Signed)
Spoke with patient and informed her. Will have appointment desk call her to schedule.. Orders placed.

## 2021-08-21 NOTE — Telephone Encounter (Signed)
Please schedule office visit, pelvic ultrasound and endometrial biopsy with me.  If there is any delay to schedule these appointments, please schedule the office visit and the EMB with me and then the ultrasound can follow.

## 2021-08-22 ENCOUNTER — Ambulatory Visit (INDEPENDENT_AMBULATORY_CARE_PROVIDER_SITE_OTHER): Payer: 59

## 2021-08-22 ENCOUNTER — Encounter: Payer: Self-pay | Admitting: Obstetrics and Gynecology

## 2021-08-22 ENCOUNTER — Ambulatory Visit: Payer: 59 | Admitting: Obstetrics and Gynecology

## 2021-08-22 ENCOUNTER — Other Ambulatory Visit (HOSPITAL_COMMUNITY)
Admission: RE | Admit: 2021-08-22 | Discharge: 2021-08-22 | Disposition: A | Discharge status: Home / Self Care | Payer: 59 | Source: Ambulatory Visit | Attending: Obstetrics and Gynecology | Admitting: Obstetrics and Gynecology

## 2021-08-22 VITALS — BP 110/60 | HR 70 | Ht 63.0 in | Wt 186.0 lb

## 2021-08-22 DIAGNOSIS — N950 Postmenopausal bleeding: Secondary | ICD-10-CM

## 2021-08-22 DIAGNOSIS — N842 Polyp of vagina: Secondary | ICD-10-CM

## 2021-08-22 NOTE — Patient Instructions (Signed)
Endometrial Biopsy ?An endometrial biopsy is a procedure to remove tissue samples from the endometrium, which is the lining of the uterus. The tissue that is removed can then be checked under a microscope for disease. ?This procedure is used to diagnose conditions such as endometrial cancer, endometrial tuberculosis, polyps, or other inflammatory conditions. This procedure may also be used to investigate uterine bleeding to determine where you are in your menstrual cycle or how your hormone levels are affecting the lining of the uterus. ?Tell a health care provider about: ?Any allergies you have. ?All medicines you are taking, including vitamins, herbs, eye drops, creams, and over-the-counter medicines. ?Any problems you or family members have had with anesthetic medicines. ?Any blood disorders you have. ?Any surgeries you have had. ?Any medical conditions you have. ?Whether you are pregnant or may be pregnant. ?What are the risks? ?Generally, this is a safe procedure. However, problems may occur, including: ?Bleeding. ?Pelvic infection. ?Puncture of the wall of the uterus with the biopsy device (rare). ?Allergic reactions to medicines. ?What happens before the procedure? ?Keep a record of your menstrual cycles as told by your health care provider. You may need to schedule your procedure for a specific time in your cycle. ?You may want to bring a sanitary pad to wear after the procedure. ?Plan to have someone take you home from the hospital or clinic. ?Ask your health care provider about: ?Changing or stopping your regular medicines. This is especially important if you are taking diabetes medicines, arthritis medicines, or blood thinners. ?Taking medicines such as aspirin and ibuprofen. These medicines can thin your blood. Do not take these medicines unless your health care provider tells you to take them. ?Taking over-the-counter medicines, vitamins, herbs, and supplements. ?What happens during the procedure? ?You  will lie on an exam table with your feet and legs supported as in a pelvic exam. ?Your health care provider will insert an instrument (speculum) into your vagina to see your cervix. ?Your cervix will be cleansed with an antiseptic solution. ?A medicine (local anesthetic) will be used to numb the cervix. ?A forceps instrument (tenaculum) will be used to hold your cervix steady for the biopsy. ?A thin, rod-like instrument (uterine sound) will be inserted through your cervix to determine the length of your uterus and the location where the biopsy sample will be removed. ?A thin, flexible tube (catheter) will be inserted through your cervix and into the uterus. The catheter will be used to collect the biopsy sample from your endometrial tissue. ?The catheter and speculum will then be removed, and the tissue sample will be sent to a lab for examination. ?The procedure may vary among health care providers and hospitals. ?What can I expect after procedure? ?You will rest in a recovery area until you are ready to go home. ?You may have mild cramping and a small amount of vaginal bleeding. This is normal. ?You may have a small amount of vaginal bleeding for a few days. This is normal. ?It is up to you to get the results of your procedure. Ask your health care provider, or the department that is doing the procedure, when your results will be ready. ?Follow these instructions at home: ?Take over-the-counter and prescription medicines only as told by your health care provider. ?Do not douche, use tampons, or have sexual intercourse until your health care provider approves. ?Return to your normal activities as told by your health care provider. Ask your health care provider what activities are safe for you. ?Follow instructions   from your health care provider about any activity restrictions, such as restrictions on strenuous exercise or heavy lifting. ?Keep all follow-up visits. This is important. ?Contact a health care  provider: ?You have heavy bleeding, or bleed for longer than 2 days after the procedure. ?You have bad smelling discharge from your vagina. ?You have a fever or chills. ?You have a burning sensation when urinating or you have difficulty urinating. ?You have severe pain in your lower abdomen. ?Get help right away if you: ?You have severe cramps in your stomach or back. ?You pass large blood clots. ?Your bleeding increases. ?You become weak or light-headed, or you faint or lose consciousness. ?Summary ?An endometrial biopsy is a procedure to remove tissue samples is taken from the endometrium, which is the lining of the uterus. ?The tissue sample that is removed will be checked under a microscope for disease. ?This procedure is used to diagnose conditions such as endometrial cancer, endometrial tuberculosis, polyps, or other inflammatory conditions. ?After the procedure, it is common to have mild cramping and a small amount of vaginal bleeding for a few days. ?Do not douche, use tampons, or have sexual intercourse until your health care provider approves. Ask your health care provider which activities are safe for you. ?This information is not intended to replace advice given to you by your health care provider. Make sure you discuss any questions you have with your health care provider. ?Document Revised: 06/07/2021 Document Reviewed: 04/18/2020 ?Elsevier Patient Education ? 2022 Elsevier Inc[employer] ? ?

## 2021-08-22 NOTE — Progress Notes (Signed)
GYNECOLOGY  VISIT   HPI: 60 y.o.   Married  Caucasian  female   G0P0000 with Patient's last menstrual period was 07/08/2020 (approximate).   here for pelvic ultrasound and EMB.    Noticed some reddish-orange in the toilet.  Spotting following this throughout the week following this.  Some lower back pain. Had breast tenderness before the spotting.   Had Covid recently on 08/12/21.   [facility] 06/08/20 29.0 and estradiol 79.5.  Last pelvic ultrasound was 2017.  Patient has been followed for fibroids.   GYNECOLOGIC HISTORY: Patient's last menstrual period was 07/08/2020 (approximate). Contraception:  Tubal Menopausal hormone therapy:  none Last mammogram:  08-21-21 3D/Implants/Neg/BiRads1 Last pap smear:  06-15-21 Neg:Neg HR HPV, 03-28-16 Neg:neg HR HPV, 02-04-14 Neg:Neg HR HPV        OB History     Gravida  0   Para  0   Term  0   Preterm      AB  0   Living         SAB      IAB      Ectopic  0   Multiple      Live Births                 Patient Active Problem List   Diagnosis Date Noted   At risk for depression 03/15/2021   At risk for deficient intake of food 12/19/2020   Prediabetes 12/19/2020   At risk for malnutrition 11/09/2020   SOBOE (shortness of breath on exertion) 10/13/2020   Fatigue 10/13/2020   Other hyperlipidemia 10/13/2020   OSA on CPAP 10/13/2020   Vitamin D deficiency 10/13/2020   Gastroesophageal reflux disease 10/13/2020   Mood disorder ([place]), with emotional eating 10/13/2020   At risk for heart disease 10/13/2020   Ingrown toenail 05/17/2020   Plantar fasciitis 05/17/2020   Social anxiety disorder     Past Medical History:  Diagnosis Date   Acid reflux    Anxiety    Back pain    Bladder prolapse, female, acquired    C. difficile colitis    Chondromalacia    COVID 08/12/2021   Depression    Dysplasia of cervix, low grade (CIN 1) 1991   Fibroid    Fibroids    uterine   Heartburn    High cholesterol    History of  COVID-19 06/05/2021   HSV-1 infection 2010   Lumbar spondylosis    Mild depression    Palpitations    Plantar fasciitis, bilateral    Sleep apnea    SOBOE (shortness of breath on exertion)    Social anxiety disorder 1998   Swallowing difficulty    Swelling of both lower extremities    Vitamin D deficiency     Past Surgical History:  Procedure Laterality Date   APPENDECTOMY     AUGMENTATION MAMMAPLASTY     BREAST ENHANCEMENT SURGERY  10/08/2006   Saline Harlow Mares)   TUBAL LIGATION      Current Outpatient Medications  Medication Sig Dispense Refill   buPROPion (WELLBUTRIN SR) 150 MG 12 hr tablet Take 1 tablet (150 mg total) by mouth daily. 30 tablet 0   escitalopram (LEXAPRO) 20 MG tablet Take 20 mg by mouth daily.     neomycin-polymyxin-hydrocortisone (CORTISPORIN) 3.5-10000-1 OTIC suspension 4 drops 3 (three) times daily.     omeprazole (PRILOSEC) 20 MG capsule Take 20 mg by mouth as needed.     rosuvastatin (CRESTOR) 10 MG tablet SMARTSIG:1  Tablet(s) By Mouth Every Evening     triamcinolone cream (KENALOG) 0.1 % APPLY TO RASH ON HANDS TWICE DAILY AS NEEDED  2   valACYclovir (VALTREX) 500 MG tablet Take one tablet (500 mg) by mouth daily.  Take one tablet (500 mg) by mouth twice daily for an outbreak. 100 tablet 3   Vitamin D, Ergocalciferol, (DRISDOL) 1.25 MG (50000 UNIT) CAPS capsule Take 1 capsule (50,000 Units total) by mouth every 7 (seven) days. 4 capsule 0   No current facility-administered medications for this visit.     ALLERGIES: Cephalosporins  Family History  Problem Relation Age of Onset   Cancer Father        lung   Thyroid disease Father    Cancer Brother        testicular cancer   Osteoarthritis Mother    Migraines Mother    Thyroid disease Mother    Depression Mother    Anxiety disorder Mother    Obesity Mother    Osteoarthritis Maternal Grandmother    Hypertension Paternal Grandmother    Stroke Paternal Grandmother     Social History    Socioeconomic History   Marital status: Married    Spouse name: R Tomi Bamberger   Number of children: Not on file   Years of education: Not on file   Highest education level: Not on file  Occupational History   Not on file  Tobacco Use   Smoking status: Former    Years: 10.00    Types: Cigarettes    Quit date: 2012    Years since quitting: 10.8   Smokeless tobacco: Never  Vaping Use   Vaping Use: Never used  Substance and Sexual Activity   Alcohol use: Yes    Alcohol/week: 2.0 standard drinks    Types: 2 Standard drinks or equivalent per week    Comment: 1-2 glasses of wine per month   Drug use: No   Sexual activity: Yes    Partners: Male    Birth control/protection: Surgical    Comment: BTL  Other Topics Concern   Not on file  Social History Narrative   Not on file   Social Determinants of Health   Financial Resource Strain: Not on file  Food Insecurity: Not on file  Transportation Needs: Not on file  Physical Activity: Not on file  Stress: Not on file  Social Connections: Not on file  Intimate Partner Violence: Not on file    Review of Systems  All other systems reviewed and are negative.  PHYSICAL EXAMINATION:    BP 110/60   Pulse 70   Ht 5\' 3"  (1.6 m)   Wt 186 lb (84.4 kg)   LMP 07/08/2020 (Approximate)   SpO2 98%   BMI 32.95 kg/m     General appearance: alert, cooperative and appears stated age   Pelvic US Uterus 9.84 x 10.24 x 10.98 mm.  EMS 7 mm.  No feeder vessel.  No masses.  4 fibroids:  5.85 cm, 164 cm, 3.93 cm, 1.36 cm.  Largest fibroid measured 4.8 x 4.6 cm on prior ultrasound.  See Epic.  Right ovary with 94.8 mm follicle.  Left ovary normal.   No follicles.   EMB and vaginal polyp biopsy Written consent for EMB and verbal consent for biopsy of vaginal polyp noted at time of EMB. Sterile prep with Hibiclens.  Paracervical block with 10 cc 1% lidocaine. Lot [id number], exp April 2024.  Pipelle passed to 9 cm x 2.  Tissue  to pathology.   Tischler used to do right vaginal wall biopsy.  Tissue to pathology.  Silver nitrate used to biopsy spot No complications. Minimal EBL.  Chaperone was present for exam:  Estill Bamberg, CMA  ASSESSMENT  Postmenopausal bleeding.  Fibroids.  Largest fibroid has increased in size, but her prior ultrasound was 5 years ago.   Thickened endometrium.  Right vaginal wall polyp. Recent normal pap and negative HR HPV.   PLAN  Pelvic US images and report reviewed.  Postmenopausal bleeding discussed including etiologies:  polyps, precancer, cancer.  FU EMB and vaginal and vaginal wall biopsy.  Will check FSH and estradiol level today.  Final plan to follow.    An After Visit Summary was printed and given to the patient.  20 min  total time was spent for this patient encounter, including preparation, face-to-face counseling with the patient, coordination of care, and documentation of the encounter.

## 2021-08-23 LAB — FOLLICLE STIMULATING HORMONE: FSH: 50.4 m[IU]/mL

## 2021-08-23 LAB — ESTRADIOL: Estradiol: <15 pg/mL

## 2021-08-24 LAB — SURGICAL PATHOLOGY

## 2021-08-25 ENCOUNTER — Telehealth: Payer: Self-pay | Admitting: *Deleted

## 2021-08-25 NOTE — Telephone Encounter (Signed)
Pt aware of recommendations

## 2021-08-25 NOTE — Telephone Encounter (Signed)
I recommend she go to the ER for evaluation due to her chest pain and nausea.  She was having back pain prior to the endometrial biopsy/pelvic US visit.  She does have lumbar spondylosis and recurrent back pain issues.

## 2021-08-25 NOTE — Telephone Encounter (Signed)
Patient called with complaints of lower back pain. Severity 8. As well as chest pain, nausea and constipation.Last bowel movement 2 days ago. Patient states she took  Celebrex  to ease her pain. Pain still present. Last OV 08/22/2021 pelvic ultrasound and EMB. I will route to Provider for recommendations

## 2021-08-29 ENCOUNTER — Encounter: Payer: Self-pay | Admitting: Obstetrics and Gynecology

## 2021-09-04 ENCOUNTER — Ambulatory Visit (INDEPENDENT_AMBULATORY_CARE_PROVIDER_SITE_OTHER): Payer: 59 | Admitting: Family Medicine

## 2021-09-04 ENCOUNTER — Encounter (INDEPENDENT_AMBULATORY_CARE_PROVIDER_SITE_OTHER): Payer: Self-pay | Admitting: Family Medicine

## 2021-09-04 ENCOUNTER — Other Ambulatory Visit: Payer: Self-pay

## 2021-09-04 VITALS — BP 107/71 | HR 77 | Temp 97.8°F | Ht 63.0 in | Wt 182.0 lb

## 2021-09-04 DIAGNOSIS — F39 Unspecified mood [affective] disorder: Secondary | ICD-10-CM | POA: Diagnosis not present

## 2021-09-04 DIAGNOSIS — Z6831 Body mass index (BMI) 31.0-31.9, adult: Secondary | ICD-10-CM

## 2021-09-04 DIAGNOSIS — E669 Obesity, unspecified: Secondary | ICD-10-CM | POA: Diagnosis not present

## 2021-09-04 DIAGNOSIS — E559 Vitamin D deficiency, unspecified: Secondary | ICD-10-CM | POA: Diagnosis not present

## 2021-09-04 DIAGNOSIS — Z9189 Other specified personal risk factors, not elsewhere classified: Secondary | ICD-10-CM | POA: Diagnosis not present

## 2021-09-04 MED ORDER — BUPROPION HCL ER (SR) 150 MG PO TB12: 150.0000 mg | ORAL_TABLET | Freq: Every day | ORAL | 0 refills | Status: DC

## 2021-09-04 NOTE — Progress Notes (Signed)
Chief Complaint:   OBESITY Lisa Wade is here to discuss her progress with her obesity treatment plan along with follow-up of her obesity related diagnoses. Lisa Wade is on keeping a food journal and adhering to recommended goals of 1500-1600 calories and 100 grams protein and states she is following her eating plan approximately 25% of the time. Lisa Wade states she is not currently exercising.  Today's visit was #: 58 Starting weight: 185 lbs Starting date: 10/13/2020 Today's weight: 182 lbs Today's date: 09/04/2021 Total lbs lost to date: 3 Total lbs lost since last in-office visit: 1  Interim History: Atziri baked desserts for Thanksgiving and ate a lot and is not sure how she lost weight. She has had less food over the past couple of weeks as well. Pt is having a CPE and bloodwork with PCP in 1-2 weeks and will bring in all results. Pt realizes she and her husband sabotage each other at times with diet. She will make conscious efforts to improve their healthier habits.  Subjective:   1. Mood disorder, with emotional eating Lisa Wade is struggling with emotional eating and using food for comfort to the extent that it is negatively impacting her health. She has been working on behavior modification techniques to help reduce her emotional eating. She shows no sign of suicidal or homicidal ideations. Medication: Wellbutrin, Lexapro  2. Vitamin D deficiency She is currently taking prescription vitamin D 50,000 IU each week. She denies nausea, vomiting or muscle weakness.  3. At risk for diabetes mellitus Lisa Wade is at higher than average risk for developing diabetes due to obesity and pre-diabetes.  Assessment/Plan:  No orders of the defined types were placed in this encounter.   Medications Discontinued During This Encounter  Medication Reason   buPROPion (WELLBUTRIN SR) 150 MG 12 hr tablet Reorder     Meds ordered this encounter  Medications   buPROPion (WELLBUTRIN SR) 150 MG 12 hr tablet    Sig:  Take 1 tablet (150 mg total) by mouth daily.    Dispense:  30 tablet    Refill:  0    30 d supply;  ** OV for RF **   Do not send RF request     1. Mood disorder, with emotional eating Behavior modification techniques were discussed today to help Lisa Wade deal with her emotional/non-hunger eating behaviors.  Orders and follow up as documented in patient record. Continue Wellbutrin and Lexapro.  Refill- buPROPion (WELLBUTRIN SR) 150 MG 12 hr tablet; Take 1 tablet (150 mg total) by mouth daily.  Dispense: 30 tablet; Refill: 0  2. Vitamin D deficiency Low Vitamin D level contributes to fatigue and are associated with obesity, breast, and colon cancer. She agrees to continue to take prescription Vitamin D 50,000 IU every week and will follow-up for routine testing of Vitamin D, at least 2-3 times per year to avoid over-replacement.  3. At risk for diabetes mellitus Lisa Wade was given approximately 9 minutes of diabetes education and counseling today. We discussed intensive lifestyle modifications today with an emphasis on weight loss as well as increasing exercise and decreasing simple carbohydrates in her diet. We also reviewed medication options with an emphasis on risk versus benefit of those discussed.   Repetitive spaced learning was employed today to elicit superior memory formation and behavioral change.  4. Obesity with current BMI of 32.4  Lisa Wade is currently in the action stage of change. As such, her goal is to continue with weight loss efforts. She has agreed  to keeping a food journal and adhering to recommended goals of 1500-1600 calories and 100 grams protein.   Exercise goals:  As is  Behavioral modification strategies: dealing with family or coworker sabotage.  Lisa Wade has agreed to follow-up with our clinic in 3 weeks. She was informed of the importance of frequent follow-up visits to maximize her success with intensive lifestyle modifications for her multiple health conditions.    Objective:   Blood pressure 107/71, pulse 77, temperature 97.8 F (36.6 C), height 5\' 3"  (1.6 m), weight 182 lb (82.6 kg), last menstrual period 07/08/2020, SpO2 95 %. Body mass index is 32.24 kg/m.  General: Cooperative, alert, well developed, in no acute distress. HEENT: Conjunctivae and lids unremarkable. Cardiovascular: Regular rhythm.  Lungs: Normal work of breathing. Neurologic: No focal deficits.   Lab Results  Component Value Date   CREATININE 0.70 02/06/2021   BUN 14 02/06/2021   NA 147 (H) 02/06/2021   K 4.8 02/06/2021   CL 109 (H) 02/06/2021   CO2 22 02/06/2021   Lab Results  Component Value Date   ALT 23 02/06/2021   AST 19 02/06/2021   ALKPHOS 66 02/06/2021   BILITOT 0.6 02/06/2021   Lab Results  Component Value Date   HGBA1C 5.7 (H) 02/06/2021   HGBA1C 5.8 (H) 10/13/2020   Lab Results  Component Value Date   INSULIN 11.0 02/06/2021   INSULIN 11.4 10/13/2020   Lab Results  Component Value Date   TSH 1.070 10/13/2020   Lab Results  Component Value Date   CHOL 177 02/06/2021   HDL 61 02/06/2021   LDLCALC 95 02/06/2021   TRIG 120 02/06/2021   CHOLHDL 2.9 02/06/2021   Lab Results  Component Value Date   VD25OH 37.1 02/06/2021   VD25OH 13.0 (L) 10/13/2020   Lab Results  Component Value Date   WBC 5.4 10/13/2020   HGB 14.7 10/13/2020   HCT 43.5 10/13/2020   MCV 86 10/13/2020   PLT 308 10/13/2020    Attestation Statements:   Reviewed by clinician on day of visit: allergies, medications, problem list, medical history, surgical history, family history, social history, and previous encounter notes.  Coral Ceo, CMA, am acting as transcriptionist for [employer], DO.  I have reviewed the above documentation for accuracy and completeness, and I agree with the above. Marjory Sneddon, D.O.  The [place] was signed into law in 2016 which includes the topic of electronic health records.  This provides immediate  access to information in MyChart.  This includes consultation notes, operative notes, office notes, lab results and pathology reports.  If you have any questions about what you read please let us know at your next visit so we can discuss your concerns and take corrective action if need be.  We are right here with you.

## 2021-09-07 NOTE — Progress Notes (Signed)
GYNECOLOGY  VISIT   HPI: 60 y.o.   Married  Caucasian  female   G0P0000 with Patient's last menstrual period was 07/08/2020 (approximate).   here to discuss EMB results.  Her husband is present for the visit today.   Patient had recent postmenopausal bleeding.  Pelvic ultrasound showed known fibroids, largest 5.85 cm.  Her ovaries were normal.  Endometrial biopsy showed simple endometrial hyperplasia without atypia.   FSH 50.44 and estradiol < 15 on 08/22/21.   GYNECOLOGIC HISTORY: Patient's last menstrual period was 07/08/2020 (approximate). Contraception: Tubal Menopausal hormone therapy:  none Last mammogram:  08-21-21 3D/Implants/Neg/BiRads1 Last pap smear:   06-15-21 Neg:Neg HR HPV, 03-28-16 Neg:neg HR HPV, 02-04-14 Neg:Neg HR HPV         OB History     Gravida  0   Para  0   Term  0   Preterm      AB  0   Living         SAB      IAB      Ectopic  0   Multiple      Live Births                 Patient Active Problem List   Diagnosis Date Noted   At risk for depression 03/15/2021   At risk for deficient intake of food 12/19/2020   Prediabetes 12/19/2020   At risk for malnutrition 11/09/2020   SOBOE (shortness of breath on exertion) 10/13/2020   Fatigue 10/13/2020   Other hyperlipidemia 10/13/2020   OSA on CPAP 10/13/2020   Vitamin D deficiency 10/13/2020   Gastroesophageal reflux disease 10/13/2020   Mood disorder ([place]), with emotional eating 10/13/2020   At risk for heart disease 10/13/2020   Ingrown toenail 05/17/2020   Plantar fasciitis 05/17/2020   Social anxiety disorder     Past Medical History:  Diagnosis Date   Acid reflux    Anxiety    Back pain    Bladder prolapse, female, acquired    C. difficile colitis    Chondromalacia    COVID 08/12/2021   Depression    Dysplasia of cervix, low grade (CIN 1) 1991   Fibroid    Fibroids    uterine   Heartburn    High cholesterol    History of COVID-19 06/05/2021   HSV-1 infection 2010    Lumbar spondylosis    Mild depression    Palpitations    Plantar fasciitis, bilateral    Simple endometrial hyperplasia without atypia 08/2021   Sleep apnea    SOBOE (shortness of breath on exertion)    Social anxiety disorder 1998   Swallowing difficulty    Swelling of both lower extremities    Vitamin D deficiency     Past Surgical History:  Procedure Laterality Date   APPENDECTOMY     AUGMENTATION MAMMAPLASTY     BREAST ENHANCEMENT SURGERY  10/08/2006   Saline Harlow Mares)   TUBAL LIGATION      Current Outpatient Medications  Medication Sig Dispense Refill   buPROPion (WELLBUTRIN SR) 150 MG 12 hr tablet Take 1 tablet (150 mg total) by mouth daily. 30 tablet 0   escitalopram (LEXAPRO) 20 MG tablet Take 20 mg by mouth daily.     neomycin-polymyxin-hydrocortisone (CORTISPORIN) 3.5-10000-1 OTIC suspension 4 drops 3 (three) times daily.     omeprazole (PRILOSEC) 20 MG capsule Take 20 mg by mouth as needed.     rosuvastatin (CRESTOR) 10 MG tablet SMARTSIG:1  Tablet(s) By Mouth Every Evening     triamcinolone cream (KENALOG) 0.1 % APPLY TO RASH ON HANDS TWICE DAILY AS NEEDED  2   valACYclovir (VALTREX) 500 MG tablet Take one tablet (500 mg) by mouth daily.  Take one tablet (500 mg) by mouth twice daily for an outbreak. 100 tablet 3   Vitamin D, Ergocalciferol, (DRISDOL) 1.25 MG (50000 UNIT) CAPS capsule Take 1 capsule (50,000 Units total) by mouth every 7 (seven) days. 4 capsule 0   No current facility-administered medications for this visit.     ALLERGIES: Cephalosporins  Family History  Problem Relation Age of Onset   Cancer Father        lung   Thyroid disease Father    Cancer Brother        testicular cancer   Osteoarthritis Mother    Migraines Mother    Thyroid disease Mother    Depression Mother    Anxiety disorder Mother    Obesity Mother    Osteoarthritis Maternal Grandmother    Hypertension Paternal Grandmother    Stroke Paternal Grandmother     Social  History   Socioeconomic History   Marital status: Married    Spouse name: R Tomi Bamberger   Number of children: Not on file   Years of education: Not on file   Highest education level: Not on file  Occupational History   Not on file  Tobacco Use   Smoking status: Former    Years: 10.00    Types: Cigarettes    Quit date: 2012    Years since quitting: 10.9   Smokeless tobacco: Never  Vaping Use   Vaping Use: Never used  Substance and Sexual Activity   Alcohol use: Yes    Alcohol/week: 2.0 standard drinks    Types: 2 Standard drinks or equivalent per week    Comment: 1-2 glasses of wine per month   Drug use: No   Sexual activity: Yes    Partners: Male    Birth control/protection: Surgical    Comment: BTL  Other Topics Concern   Not on file  Social History Narrative   Not on file   Social Determinants of Health   Financial Resource Strain: Not on file  Food Insecurity: Not on file  Transportation Needs: Not on file  Physical Activity: Not on file  Stress: Not on file  Social Connections: Not on file  Intimate Partner Violence: Not on file    Review of Systems  All other systems reviewed and are negative.  PHYSICAL EXAMINATION:    Ht 5\' 3"  (1.6 m)   Wt 186 lb (84.4 kg)   LMP 07/08/2020 (Approximate)   BMI 32.95 kg/m     General appearance: alert, cooperative and appears stated age  ASSESSMENT  Postmenopausal bleeding.  Uterine fibroids.  Simple endometrial hyperplasia without atypia.  PLAN  We discussed postmenopausal bleeding and different etiologies for this.  Endometrial hyperplasia reviewed.  Risk of potential development of endometrial cancer about 10% over the next 10 years if untreated.  Treatment options of progesterone - Mirena IUD, Megace, Provera, and Aygestin reviewed along with follow up endometrial biopsies discussed. Surgical care with laparoscopic hysterectomy, bilateral salpingectomy, possible bilateral oophorectomy, cystoscopy, vaginal  morcellation of uterine specimen in Alexis bag discussed also.  Surgical risks may include but are not limited to bleeding, infection, damage to surrounding organs, reaction to anesthesia, DVT, PE, death, uterine cuff dehiscence, and conversion to laparotomy to complete the procedure.  Observation management with  follow up endometrial biopsies mentioned as well.  Patient and her husband may discuss further and patient may touch base with her primary care provider 36 minbout treatment options as well.    An After Visit Summary was printed and given to the patient.  36 min total time was spent for this patient encounter, including preparation, face-to-face counseling with the patient, coordination of care, and documentation of the encounter.

## 2021-09-11 ENCOUNTER — Ambulatory Visit: Payer: 59 | Admitting: Obstetrics and Gynecology

## 2021-09-11 ENCOUNTER — Encounter: Payer: Self-pay | Admitting: Obstetrics and Gynecology

## 2021-09-11 ENCOUNTER — Other Ambulatory Visit: Payer: Self-pay

## 2021-09-11 VITALS — BP 120/66 | HR 68 | Ht 63.0 in | Wt 186.0 lb

## 2021-09-11 DIAGNOSIS — N8501 Benign endometrial hyperplasia: Secondary | ICD-10-CM | POA: Diagnosis not present

## 2021-09-11 NOTE — Patient Instructions (Signed)
Endometrial Hyperplasia Endometrial hyperplasia is abnormal thickening of the tissue that lines the inside of the uterus (endometrium). This can cause heavy bleeding. Endometrial hyperplasia can cause cell changes that can lead to endometrial cancer. Types of this condition include: Simple hyperplasia. This is tissue thickening only. Complex hyperplasia. This is tissue thickening with an overgrowth of cells. Simple atypical hyperplasia. This is tissue thickening with cell changes. This type has a low risk of leading to cancer. Complex atypical hyperplasia. This is tissue thickening, cell crowding, and cell changes. This has a higher risk of becoming cancerous. Early diagnosis and treatment are important to reduce the risk of cancer. What are the causes? This condition is caused by too much estrogen and not enough progesterone in the body. What increases the risk? The following factors may make you more likely to develop this condition: The onset of menopause. Menopause happens when you have gone 12 months without a menstrual period. Having conditions, such as: Polycystic ovary syndrome (PCOS). Diabetes. Obesity. Taking an estrogen medicine or a medicine that acts like estrogen. Having started menstrual periods early or menopause late. Having never been pregnant. Having a family history of uterine, ovarian, or colon cancer. What are the signs or symptoms? The main symptom of this condition is abnormal vaginal bleeding, such as: Bleeding that is heavier and longer during menstrual periods. Bleeding that happens between menstrual periods. Bleeding that happens after menopause. Some people have no symptoms. How is this diagnosed? This condition may be diagnosed based on: Your symptoms, risk factors, and medical history. A physical exam. Tests, such as: Transvaginal ultrasound. This imaging test uses a probe that is inserted into the vagina to check the endometrium. Endometrial biopsy.  Samples of endometrial tissue are taken and looked at under a microscope. Dilation and curettage. Tissue is scraped or suctioned from the inside of the uterus and looked at under a microscope. Hysteroscopy. This procedure uses a thin, lighted device to see inside the uterus. How is this treated? Treatment for this condition depends on the type of hyperplasia that you have. Synthetic progesterone (progestin) is the most common treatment. It can be given as a pill, injection, or vaginal cream. It can also be given as an intrauterine device (IUD) that is implanted in your uterus. If you have hyperplasia with atypia, you may need a hysterectomy. This is surgery to remove your uterus. You may be more likely to have a hysterectomy if: You have complex atypical hyperplasia. You are past menopause. You do not want to become pregnant. Follow these instructions at home: Take over-the-counter and prescription medicines only as told by your health care provider. Lose weight if you are obese. Ask your health care provider to recommend a diet and exercise program to help you reach and maintain a healthy weight. Keep all follow-up visits. This is important. Contact a health care provider if: You have menstrual periods that are heavier or last longer than usual. You have menstrual periods more often than usual. You have vaginal bleeding after menopause. Get help right away if: You have heavy vaginal bleeding that soaks a sanitary pad in less than 2 hours. You pass blood clots that are larger than 1 inch (2.5 cm) in diameter. Summary Endometrial hyperplasia is abnormal thickening of the tissue that lines the inside of the uterus (endometrium). The main symptom of this condition is abnormal vaginal bleeding. This may mean heavier, longer, or more frequent menstrual periods, or it may mean vaginal bleeding after menopause. Some types of hyperplasia   cause cell changes that can lead to cancer. Always let your  health care provider know about abnormal vaginal bleeding. Early diagnosis and treatment are important to reduce the risk of cancer. This information is not intended to replace advice given to you by your health care provider. Make sure you discuss any questions you have with your health care provider. Document Revised: 04/14/2020 Document Reviewed: 04/14/2020 Elsevier Patient Education  [employer].

## 2021-09-13 ENCOUNTER — Telehealth: Payer: Self-pay | Admitting: Obstetrics and Gynecology

## 2021-09-13 NOTE — Telephone Encounter (Signed)
Please touch base with patient around 04/18/21 to discuss potential precertification and scheduling of total laparoscopic hysterectomy with bilateral salpingectomy, possible bilateral oophorectomy, cystoscopy, vaginal morcellation of uterine specimen in Alexis bag, versus progesterone treatment for simple endometrial hyperplasia without atypia.   She is considering these options.

## 2021-09-14 NOTE — Telephone Encounter (Signed)
Routing to [employer]

## 2021-09-14 NOTE — Telephone Encounter (Signed)
Reminder placed.   Encounter closed.

## 2021-09-19 ENCOUNTER — Telehealth: Payer: Self-pay | Admitting: *Deleted

## 2021-09-19 DIAGNOSIS — N393 Stress incontinence (female) (male): Secondary | ICD-10-CM

## 2021-09-19 DIAGNOSIS — Z01812 Encounter for preprocedural laboratory examination: Secondary | ICD-10-CM

## 2021-09-19 NOTE — Telephone Encounter (Signed)
Spoke with patient. Patient request to proceed with surgery,  total laparoscopic hysterectomy with bilateral salpingectomy, possible bilateral oophorectomy, cystoscopy, vaginal morcellation of uterine specimen in Alexis bag discussed at 09/11/21 OV.   Patient is requesting to schedule in 10/2021. Reviewed surgery dates. Advised I will review with Dr. Quincy Simmonds and f/u. Patient agreeable.   Dr. Quincy Simmonds -please review and provide surgery details.

## 2021-09-20 NOTE — Telephone Encounter (Signed)
Please proceed with precert and scheduling of total laparoscopic hysterectomy with bilateral salpingectomy, possible bilateral oophorectomy, cystoscopy, vaginal morcellation of uterine in Alexis bag.   Diagnosis:  simple endometrial hyperplasia without atypia, fibroids.   Time needed:  3 hours.    Assistant needed:  yet.  Location:  [facility].   Preop needed:  yes.

## 2021-09-20 NOTE — Telephone Encounter (Signed)
Patient will need an office visit to discuss and evaluate this further.

## 2021-09-20 NOTE — Telephone Encounter (Signed)
Spoke with patient. Reviewed surgery dates. Patient request to proceed with surgery on 10/23/21.  Advised patient I will forward to business office for return call. I will return call once surgery date and time confirmed. Patient verbalizes understanding and is agreeable.   Surgery request sent.   Pre-op scheduled for 10/10/21.   Patient states she has discussed "bladder repair surgery" in the past and would like to include this in her surgery if recommended. Advised I will forward request to Dr. Quincy Simmonds to review, may need additional OV for consult. Patient agreeable. I will f/u with recommendations.    Dr. Quincy Simmonds -please advise on additional surgery.   Cc: Hayley Carder

## 2021-09-21 NOTE — Telephone Encounter (Signed)
Spoke with patient, advised as seen below per Dr. Quincy Simmonds. OV scheduled for 12/28 at 0830 for consult. Will keep surgery as schedule for now, Dr. Quincy Simmonds will provide update after OV. Patient verbalizes understanding and is agreeable.   Routing to Dr. Antony Blackbird.   Cc: Hayley Carder

## 2021-09-22 NOTE — Telephone Encounter (Signed)
Thank you for the update!

## 2021-09-22 NOTE — Progress Notes (Signed)
GYNECOLOGY  VISIT   HPI: 60 y.o.   Married  Caucasian  female   G0P0000 with Patient's last menstrual period was 07/08/2020 (approximate).   here for surgical consult.  Patient has postmenopausal bleeding and endometrial biopsy showing simple endometrial hyperplasia without atypia.  She also has multiple fibroids, the largest of which is 5.85 cm.  Her ovaries are normal on ultrasound.    Patient is interested in hysterectomy for treatment, and she declines medical therapy.   Patient also has concerns about her bladder.  She has done pelvic floor PT.  She has experienced a slight burning in her pelvic floor.  She does drink tea and coffee.   She can feel her bladder at the vaginal opening.  With straining to have a BM, she feels everything coming forward.  She has urinary incontinence with coughing, sneezing, lifting something heavy, exercising and jumping on a trampoline.  She can leak urine with standing after voiding.  May leak urine with sound of running water.  DF:  can go for hours without voiding.  At home, she goes constantly.  At least every couple of hours.  NF:  occasional gets up at night to void.  Not really having urgency leakage.   No dysuria.  No obvious blood in the urine, but she does have trace hematuria with her routine checks with her PCP.  No hx of bladder polyps, kidney stones.  She has had no bladder surgery.   Some difficulty having bowel movements.  She does vaginal splinting recently to have BMs.  Notes stool at the anal opening with wiping to have a BM.   She does have lower right sided back pain and wonders if it is related to constipation.  Her back pain is chronic.  No prior urodynamic testing.   GYNECOLOGIC HISTORY: Patient's last menstrual period was 07/08/2020 (approximate). Contraception:  btl Menopausal hormone therapy:  none Last mammogram:  08-21-21 category c density birads 1:neg Last pap smear:   06-15-21 neg HPV HV neg, 03-28-16 neg HPV  HR neg, 02-04-14 neg HPV HR neg        OB History     Gravida  0   Para  0   Term  0   Preterm      AB  0   Living         SAB      IAB      Ectopic  0   Multiple      Live Births                 Patient Active Problem List   Diagnosis Date Noted   At risk for depression 03/15/2021   At risk for deficient intake of food 12/19/2020   Prediabetes 12/19/2020   At risk for malnutrition 11/09/2020   SOBOE (shortness of breath on exertion) 10/13/2020   Fatigue 10/13/2020   Other hyperlipidemia 10/13/2020   OSA on CPAP 10/13/2020   Vitamin D deficiency 10/13/2020   Gastroesophageal reflux disease 10/13/2020   Mood disorder ([place]), with emotional eating 10/13/2020   At risk for heart disease 10/13/2020   Ingrown toenail 05/17/2020   Plantar fasciitis 05/17/2020   Social anxiety disorder     Past Medical History:  Diagnosis Date   Acid reflux    Anxiety    Back pain    Bladder prolapse, female, acquired    C. difficile colitis    Chondromalacia    COVID 08/12/2021   Depression  Dysplasia of cervix, low grade (CIN 1) 1991   Fibroid    Fibroids    uterine   Heartburn    High cholesterol    History of COVID-19 06/05/2021   HSV-1 infection 2010   Lumbar spondylosis    Mild depression    Palpitations    Plantar fasciitis, bilateral    Simple endometrial hyperplasia without atypia 08/2021   Sleep apnea    SOBOE (shortness of breath on exertion)    Social anxiety disorder 1998   Swallowing difficulty    Swelling of both lower extremities    Vitamin D deficiency     Past Surgical History:  Procedure Laterality Date   APPENDECTOMY     AUGMENTATION MAMMAPLASTY     BREAST ENHANCEMENT SURGERY  10/08/2006   Saline Harlow Mares)   TUBAL LIGATION      Current Outpatient Medications  Medication Sig Dispense Refill   buPROPion (WELLBUTRIN SR) 150 MG 12 hr tablet Pt to increase dose to BID 30 tablet 0   Celecoxib (CELEBREX PO) Take by mouth daily.      escitalopram (LEXAPRO) 20 MG tablet Take 20 mg by mouth daily.     neomycin-polymyxin-hydrocortisone (CORTISPORIN) 3.5-10000-1 OTIC suspension 4 drops 3 (three) times daily.     omeprazole (PRILOSEC) 20 MG capsule Take 20 mg by mouth as needed.     rosuvastatin (CRESTOR) 10 MG tablet SMARTSIG:1 Tablet(s) By Mouth Every Evening     triamcinolone cream (KENALOG) 0.1 % APPLY TO RASH ON HANDS TWICE DAILY AS NEEDED  2   valACYclovir (VALTREX) 500 MG tablet Take one tablet (500 mg) by mouth daily.  Take one tablet (500 mg) by mouth twice daily for an outbreak. 100 tablet 3   Vitamin D, Ergocalciferol, (DRISDOL) 1.25 MG (50000 UNIT) CAPS capsule Take 1 capsule (50,000 Units total) by mouth every 7 (seven) days. 4 capsule 0   No current facility-administered medications for this visit.     ALLERGIES: Cephalosporins  Family History  Problem Relation Age of Onset   Cancer Father        lung   Thyroid disease Father    Cancer Brother        testicular cancer   Osteoarthritis Mother    Migraines Mother    Thyroid disease Mother    Depression Mother    Anxiety disorder Mother    Obesity Mother    Osteoarthritis Maternal Grandmother    Hypertension Paternal Grandmother    Stroke Paternal Grandmother     Social History   Socioeconomic History   Marital status: Married    Spouse name: R Tomi Bamberger   Number of children: Not on file   Years of education: Not on file   Highest education level: Not on file  Occupational History   Not on file  Tobacco Use   Smoking status: Former    Years: 10.00    Types: Cigarettes    Quit date: 2012    Years since quitting: 10.9   Smokeless tobacco: Never  Vaping Use   Vaping Use: Never used  Substance and Sexual Activity   Alcohol use: Not Currently    Comment: 1-2 glasses of wine per month   Drug use: No   Sexual activity: Yes    Partners: Male    Birth control/protection: Surgical, Post-menopausal    Comment: BTL  Other Topics Concern   Not  on file  Social History Narrative   Not on file   Social Determinants of Health  Financial Resource Strain: Not on file  Food Insecurity: Not on file  Transportation Needs: Not on file  Physical Activity: Not on file  Stress: Not on file  Social Connections: Not on file  Intimate Partner Violence: Not on file    Review of Systems  Constitutional: Negative.   HENT: Negative.    Eyes: Negative.   Respiratory: Negative.    Cardiovascular: Negative.   Gastrointestinal: Negative.   Endocrine: Negative.   Genitourinary:        Discomfort from prolapse  Musculoskeletal: Negative.   Skin: Negative.   Allergic/Immunologic: Negative.   Neurological: Negative.   Hematological: Negative.   Psychiatric/Behavioral: Negative.     PHYSICAL EXAMINATION:    BP 110/72    Pulse 73    Resp 16    Wt 186 lb (84.4 kg)    LMP 07/08/2020 (Approximate)    BMI 32.95 kg/m     General appearance: alert, cooperative and appears stated age  Pelvic: External genitalia:  no lesions              Urethra:  normal appearing urethra with no masses, tenderness or lesions              Bartholins and Skenes: normal                 Vagina: normal appearing vagina with normal color and discharge, no lesions.  Second degree cystocele, good uterine support, and first degree rectocele.               Cervix: no lesions                Bimanual Exam:  Uterus:  10 week size, irregular uterus.              Adnexa: no mass, fullness, tenderness              Rectal exam: Yes.  .  Confirms.              Anus:  normal sphincter tone, no lesions  Chaperone was present for exam:  Joy, CMA.  ASSESSMENT  Cystocele.  Rectocele.  Stress incontinence.  Simple endometrial hyperplasia without atypia.  Uterine fibroids.  Hx C Diff.  Sleep apnea. Pre-diabetes.  Last A1C 5.7.  PLAN  Will check A1C today.  We discussed cystocele, stress incontinence, and rectocele.  We reviewed risk factors for prolapse and  incontinence and potential surgical repair with midurethral sling using permanent mesh, and cystocele repair and rectocele repair.  Risks of prolapse and incontinence surgery may include but are not limited to bleeding, infection, damage to surrounding organs, ureteral compression, urinary retention, slower voiding, need for self catheterization, mesh erosion and exposure, overactive bladder, need for additional surgery, painful intercourse, and fistula formation.  She wishes to proceed forward with prolapse and incontinence surgery at the time of her laparoscopic hysterectomy.  She will return for simple urodynamic testing.   We reviewed no lifting over 10 pounds for 3 months following surgery.    An After Visit Summary was printed and given to the patient.  40 min  total time was spent for this patient encounter, including preparation, face-to-face counseling with the patient, coordination of care, and documentation of the encounter.

## 2021-09-25 ENCOUNTER — Encounter (INDEPENDENT_AMBULATORY_CARE_PROVIDER_SITE_OTHER): Payer: Self-pay | Admitting: Family Medicine

## 2021-09-25 ENCOUNTER — Other Ambulatory Visit: Payer: Self-pay

## 2021-09-25 ENCOUNTER — Ambulatory Visit (INDEPENDENT_AMBULATORY_CARE_PROVIDER_SITE_OTHER): Payer: 59 | Admitting: Family Medicine

## 2021-09-25 VITALS — BP 108/70 | HR 78 | Temp 97.7°F | Ht 63.0 in | Wt 180.0 lb

## 2021-09-25 DIAGNOSIS — E669 Obesity, unspecified: Secondary | ICD-10-CM

## 2021-09-25 DIAGNOSIS — E559 Vitamin D deficiency, unspecified: Secondary | ICD-10-CM

## 2021-09-25 DIAGNOSIS — F39 Unspecified mood [affective] disorder: Secondary | ICD-10-CM | POA: Diagnosis not present

## 2021-09-25 DIAGNOSIS — D259 Leiomyoma of uterus, unspecified: Secondary | ICD-10-CM

## 2021-09-25 DIAGNOSIS — Z6831 Body mass index (BMI) 31.0-31.9, adult: Secondary | ICD-10-CM

## 2021-09-25 MED ORDER — VITAMIN D (ERGOCALCIFEROL) 1.25 MG (50000 UNIT) PO CAPS: 50000.0000 [IU] | ORAL_CAPSULE | ORAL | 0 refills | Status: DC

## 2021-09-25 MED ORDER — BUPROPION HCL ER (SR) 150 MG PO TB12: ORAL_TABLET | ORAL | 0 refills | Status: DC

## 2021-09-26 NOTE — Progress Notes (Signed)
Chief Complaint:   OBESITY Lisa Wade is here to discuss her progress with her obesity treatment plan along with follow-up of her obesity related diagnoses. Lisa Wade is on keeping a food journal and adhering to recommended goals of 1500-1600 calories and 100 grams of protein daily and states she is following her eating plan approximately 25% of the time. Lisa Wade states she is not currently exercising.  Today's visit was #: 18 Starting weight: 185 lbs Starting date: 10/13/2020 Today's weight: 180 lbs Today's date: 09/25/2021 Total lbs lost to date: 5 Total lbs lost since last in-office visit: 2  Interim History: Lisa Wade is not sure why she lost weight. She has been cooking at home more, which is the only thing that she has changed. She is skipping less meals/food, still less than prior.  Subjective:   1. Vitamin D deficiency Lisa Wade is currently taking prescription vitamin D 50,000 IU each week. She denies nausea, vomiting or muscle weakness.  2. Uterine leiomyoma, unspecified location Lisa Wade has a hysterectomy scheduled for January 16th 2023.   3. 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  Assessment/Plan:  No orders of the defined types were placed in this encounter.   Medications Discontinued During This Encounter  Medication Reason   Vitamin D, Ergocalciferol, (DRISDOL) 1.25 MG (50000 UNIT) CAPS capsule Reorder   buPROPion (WELLBUTRIN SR) 150 MG 12 hr tablet Reorder     Meds ordered this encounter  Medications   buPROPion (WELLBUTRIN SR) 150 MG 12 hr tablet    Sig: Pt to increase dose to BID    Dispense:  30 tablet    Refill:  0    30 d supply;  ** OV for RF **   Do not send RF request   Vitamin D, Ergocalciferol, (DRISDOL) 1.25 MG (50000 UNIT) CAPS capsule    Sig: Take 1 capsule (50,000 Units total) by mouth every 7 (seven) days.    Dispense:  4 capsule    Refill:  0    Ov  for rf     1. Vitamin D deficiency We will refill prescription Vit D for 1 month, and will follow up at Lisa Wade's next office visit.  - Reiterated importance of vitamin D (as well as calcium) to their health and wellbeing.  - Reminded Lisa Wade that weight loss will likely improve availability of vitamin D, thus encouraged her to continue with meal plan and their weight loss efforts to further improve this condition. - I recommend patient continue to take weekly prescription vit D 50,000 IU - Informed patient this may be a lifelong thing, and she was encouraged to continue to take the medicine until told otherwise.   - we will need to monitor levels regularly (every 3-4 mo on average) to keep levels within normal limits.  - weight loss will likely improve availability of vitamin D, thus encouraged Lisa Wade to continue with meal plan and their weight loss efforts to further improve this condition - pt's questions and concerns regarding this condition addressed.  - Vitamin D, Ergocalciferol, (DRISDOL) 1.25 MG (50000 UNIT) CAPS capsule; Take 1 capsule (50,000 Units total) by mouth every 7 (seven) days.  Dispense: 4 capsule; Refill: 0  2. Uterine leiomyoma, unspecified location Lisa Wade is to schedule her next office visit prior to her surgery, so that we can discuss strategies for after her surgery to get back on the plan.  3. Mood disorder, with emotional eating Sua agreed to increase Wellbutrin to 150 mg BID, and we will refill for 1 month. She has leftover 75 mg dose and she will use those as her second daily dose until she is complete, then she will go to 150 mg BID. Orders and follow up as documented in patient record.   - buPROPion (WELLBUTRIN SR) 150 MG 12 hr tablet; Pt to increase dose to BID  Dispense: 30 tablet; Refill: 0  4. Obesity with current BMI of 32.0 Lisa Wade is currently in the action stage of change. As such, her goal is to continue with weight loss efforts. She has agreed to the  Category 2 Plan.   Lisa Wade is to bring in her blood work results from her primary care physician's office that she had done 2 weeks ago.  Exercise goals: As is, increase as tolerated.  Behavioral modification strategies: holiday eating strategies (handout was given).  Lisa Wade has agreed to follow-up with our clinic in 3 weeks. She was informed of the importance of frequent follow-up visits to maximize her success with intensive lifestyle modifications for her multiple health conditions.   Objective:   Blood pressure 108/70, pulse 78, temperature 97.7 F (36.5 C), height 5\' 3"  (1.6 m), weight 180 lb (81.6 kg), last menstrual period 07/08/2020, SpO2 95 %. Body mass index is 31.89 kg/m.  General: Cooperative, alert, well developed, in no acute distress. HEENT: Conjunctivae and lids unremarkable. Cardiovascular: Regular rhythm.  Lungs: Normal work of breathing. Neurologic: No focal deficits.   Lab Results  Component Value Date   CREATININE 0.70 02/06/2021   BUN 14 02/06/2021   NA 147 (H) 02/06/2021   K 4.8 02/06/2021   CL 109 (H) 02/06/2021   CO2 22 02/06/2021   Lab Results  Component Value Date   ALT 23 02/06/2021   AST 19 02/06/2021   ALKPHOS 66 02/06/2021   BILITOT 0.6 02/06/2021   Lab Results  Component Value Date   HGBA1C 5.7 (H) 02/06/2021   HGBA1C 5.8 (H) 10/13/2020   Lab Results  Component Value Date   INSULIN 11.0 02/06/2021   INSULIN 11.4 10/13/2020   Lab Results  Component Value Date   TSH 1.070 10/13/2020   Lab Results  Component Value Date   CHOL 177 02/06/2021   HDL 61 02/06/2021   LDLCALC 95 02/06/2021   TRIG 120 02/06/2021   CHOLHDL 2.9 02/06/2021   Lab Results  Component Value Date   VD25OH 37.1 02/06/2021   VD25OH 13.0 (L) 10/13/2020   Lab Results  Component Value Date   WBC 5.4 10/13/2020   HGB 14.7 10/13/2020   HCT 43.5 10/13/2020   MCV 86 10/13/2020   PLT 308 10/13/2020   No results found for: IRON, TIBC, FERRITIN  Attestation  Statements:   Reviewed by clinician on day of visit: allergies, medications, problem list, medical history, surgical history, family history, social history, and previous encounter notes.   Lisa Wade, am acting as transcriptionist for [employer], DO.  I have reviewed the above documentation for accuracy and completeness, and I agree with the above. Lisa Wade, D.O.  The [place] was signed into law in 2016 which includes the topic of electronic health records.  This provides immediate access to information in MyChart.  This includes consultation notes, operative notes, office notes, lab results and pathology reports.  If you have any questions about what you read please let us know at your next  visit so we can discuss your concerns and take corrective action if need be.  We are right here with you.

## 2021-09-28 NOTE — Progress Notes (Deleted)
GYNECOLOGY  VISIT   HPI: 60 y.o.   Married  Caucasian  female   G0P0000 with Patient's last menstrual period was 07/08/2020 (approximate).   here for pre-op exam.   GYNECOLOGIC HISTORY: Patient's last menstrual period was 07/08/2020 (approximate). Contraception:  Tubal Menopausal hormone therapy:  none Last mammogram:  08-21-21 3D/Implants/Neg/BiRads1 Last pap smear:   06-15-21 Neg:Neg HR HPV, 03-28-16 Neg:neg HR HPV, 02-04-14 Neg:Neg HR HPV         OB History     Gravida  0   Para  0   Term  0   Preterm      AB  0   Living         SAB      IAB      Ectopic  0   Multiple      Live Births                 Patient Active Problem List   Diagnosis Date Noted   At risk for depression 03/15/2021   At risk for deficient intake of food 12/19/2020   Prediabetes 12/19/2020   At risk for malnutrition 11/09/2020   SOBOE (shortness of breath on exertion) 10/13/2020   Fatigue 10/13/2020   Other hyperlipidemia 10/13/2020   OSA on CPAP 10/13/2020   Vitamin D deficiency 10/13/2020   Gastroesophageal reflux disease 10/13/2020   Mood disorder ([place]), with emotional eating 10/13/2020   At risk for heart disease 10/13/2020   Ingrown toenail 05/17/2020   Plantar fasciitis 05/17/2020   Social anxiety disorder     Past Medical History:  Diagnosis Date   Acid reflux    Anxiety    Back pain    Bladder prolapse, female, acquired    C. difficile colitis    Chondromalacia    COVID 08/12/2021   Depression    Dysplasia of cervix, low grade (CIN 1) 1991   Fibroid    Fibroids    uterine   Heartburn    High cholesterol    History of COVID-19 06/05/2021   HSV-1 infection 2010   Lumbar spondylosis    Mild depression    Palpitations    Plantar fasciitis, bilateral    Simple endometrial hyperplasia without atypia 08/2021   Sleep apnea    SOBOE (shortness of breath on exertion)    Social anxiety disorder 1998   Swallowing difficulty    Swelling of both lower extremities     Vitamin D deficiency     Past Surgical History:  Procedure Laterality Date   APPENDECTOMY     AUGMENTATION MAMMAPLASTY     BREAST ENHANCEMENT SURGERY  10/08/2006   Saline Harlow Mares)   TUBAL LIGATION      Current Outpatient Medications  Medication Sig Dispense Refill   buPROPion (WELLBUTRIN SR) 150 MG 12 hr tablet Pt to increase dose to BID 30 tablet 0   escitalopram (LEXAPRO) 20 MG tablet Take 20 mg by mouth daily.     neomycin-polymyxin-hydrocortisone (CORTISPORIN) 3.5-10000-1 OTIC suspension 4 drops 3 (three) times daily.     omeprazole (PRILOSEC) 20 MG capsule Take 20 mg by mouth as needed.     rosuvastatin (CRESTOR) 10 MG tablet SMARTSIG:1 Tablet(s) By Mouth Every Evening     triamcinolone cream (KENALOG) 0.1 % APPLY TO RASH ON HANDS TWICE DAILY AS NEEDED  2   valACYclovir (VALTREX) 500 MG tablet Take one tablet (500 mg) by mouth daily.  Take one tablet (500 mg) by mouth twice daily for an  outbreak. 100 tablet 3   Vitamin D, Ergocalciferol, (DRISDOL) 1.25 MG (50000 UNIT) CAPS capsule Take 1 capsule (50,000 Units total) by mouth every 7 (seven) days. 4 capsule 0   No current facility-administered medications for this visit.     ALLERGIES: Cephalosporins  Family History  Problem Relation Age of Onset   Cancer Father        lung   Thyroid disease Father    Cancer Brother        testicular cancer   Osteoarthritis Mother    Migraines Mother    Thyroid disease Mother    Depression Mother    Anxiety disorder Mother    Obesity Mother    Osteoarthritis Maternal Grandmother    Hypertension Paternal Grandmother    Stroke Paternal Grandmother     Social History   Socioeconomic History   Marital status: Married    Spouse name: R Tomi Bamberger   Number of children: Not on file   Years of education: Not on file   Highest education level: Not on file  Occupational History   Not on file  Tobacco Use   Smoking status: Former    Years: 10.00    Types: Cigarettes    Quit date:  2012    Years since quitting: 10.9   Smokeless tobacco: Never  Vaping Use   Vaping Use: Never used  Substance and Sexual Activity   Alcohol use: Yes    Alcohol/week: 2.0 standard drinks    Types: 2 Standard drinks or equivalent per week    Comment: 1-2 glasses of wine per month   Drug use: No   Sexual activity: Yes    Partners: Male    Birth control/protection: Surgical    Comment: BTL  Other Topics Concern   Not on file  Social History Narrative   Not on file   Social Determinants of Health   Financial Resource Strain: Not on file  Food Insecurity: Not on file  Transportation Needs: Not on file  Physical Activity: Not on file  Stress: Not on file  Social Connections: Not on file  Intimate Partner Violence: Not on file    Review of Systems  PHYSICAL EXAMINATION:    LMP 07/08/2020 (Approximate)     General appearance: alert, cooperative and appears stated age Head: Normocephalic, without obvious abnormality, atraumatic Neck: no adenopathy, supple, symmetrical, trachea midline and thyroid normal to inspection and palpation Lungs: clear to auscultation bilaterally Breasts: normal appearance, no masses or tenderness, No nipple retraction or dimpling, No nipple discharge or bleeding, No axillary or supraclavicular adenopathy Heart: regular rate and rhythm Abdomen: soft, non-tender, no masses,  no organomegaly Extremities: extremities normal, atraumatic, no cyanosis or edema Skin: Skin color, texture, turgor normal. No rashes or lesions Lymph nodes: Cervical, supraclavicular, and axillary nodes normal. No abnormal inguinal nodes palpated Neurologic: Grossly normal  Pelvic: External genitalia:  no lesions              Urethra:  normal appearing urethra with no masses, tenderness or lesions              Bartholins and Skenes: normal                 Vagina: normal appearing vagina with normal color and discharge, no lesions              Cervix: no lesions                 Bimanual Exam:  Uterus:  normal size, contour, position, consistency, mobility, non-tender              Adnexa: no mass, fullness, tenderness              Rectal exam: {yes no:[id number]}.  Confirms.              Anus:  normal sphincter tone, no lesions  Chaperone was present for exam:  ***  ASSESSMENT     PLAN     An After Visit Summary was printed and given to the patient.  ______ minutes face to face time of which over 50% was spent in counseling.

## 2021-10-04 ENCOUNTER — Other Ambulatory Visit: Payer: Self-pay

## 2021-10-04 ENCOUNTER — Encounter: Payer: Self-pay | Admitting: Obstetrics and Gynecology

## 2021-10-04 ENCOUNTER — Ambulatory Visit: Payer: 59 | Admitting: Obstetrics and Gynecology

## 2021-10-04 VITALS — BP 110/72 | HR 73 | Resp 16 | Wt 186.0 lb

## 2021-10-04 DIAGNOSIS — N8110 Cystocele, unspecified: Secondary | ICD-10-CM

## 2021-10-04 DIAGNOSIS — N816 Rectocele: Secondary | ICD-10-CM | POA: Diagnosis not present

## 2021-10-04 DIAGNOSIS — N393 Stress incontinence (female) (male): Secondary | ICD-10-CM

## 2021-10-04 DIAGNOSIS — R7303 Prediabetes: Secondary | ICD-10-CM | POA: Diagnosis not present

## 2021-10-04 NOTE — Patient Instructions (Signed)
Pelvic Organ Prolapse Pelvic organ prolapse is a condition in women that involves the stretching, bulging, or dropping of pelvic organs into an abnormal position, past the opening of the vagina. It happens when the muscles and tissues that surround and support pelvic structures become weak or stretched. Pelvic organ prolapse can involve the: Vagina (vaginal prolapse). Uterus (uterine prolapse). Bladder (cystocele). Rectum (rectocele). Intestines (enterocele). When organs other than the vagina are involved, they often bulge into the vagina or protrude from the vagina, depending on how severe the prolapse is. What are the causes? This condition may be caused by: Pregnancy, labor, and childbirth. Past pelvic surgery. Lower levels of the hormone estrogen due to menopause. Consistently lifting more than 50 lb (23 kg). Obesity. Long-term difficulty passing stool (chronic constipation). Long-term, or chronic, cough. Fluid buildup in the abdomen due to certain conditions. What are the signs or symptoms? Symptoms of this condition include: Leaking a little urine (loss of bladder control) when you cough, sneeze, strain, and exercise (stress incontinence). This may be worse immediately after childbirth. It may gradually improve over time. Feeling pressure in your pelvis or vagina. This pressure may increase when you cough or when you are passing stool. A bulge that protrudes from the opening of your vagina. Difficulty passing urine or stool. Pain in your lower back. Pain or discomfort during sex, or decreased interest in sex. Repeated bladder infections (urinary tract infections). Difficulty inserting a tampon. In some people, this condition causes no symptoms. How is this diagnosed? This condition may be diagnosed based on a vaginal and rectal exam. During the exam, you may be asked to cough and strain while you are lying down, sitting, and standing up. Your health care provider will determine if  other tests are required, such as bladder function tests. How is this treated? Treatment for this condition may depend on your symptoms. Treatment may include: Lifestyle changes, such as drinking plenty of fluids and eating foods that are high in fiber. Emptying your bladder at scheduled times (bladder training therapy). This can help reduce or avoid urinary incontinence. Estrogen. This may help mild prolapse by increasing the strength and tone of pelvic floor muscles. Kegel exercises. These may help mild cases of prolapse by strengthening and tightening the muscles of the pelvic floor. A soft, flexible device that helps support the vaginal walls and keep pelvic organs in place (pessary). This is inserted into your vagina by your health care provider. Surgery. This is often the only form of treatment for severe prolapse. Follow these instructions at home: Eating and drinking Avoid drinking beverages that contain caffeine or alcohol. Increase your intake of high-fiber foods to decrease constipation and straining during bowel movements. Activity Lose weight if recommended by your health care provider. Avoid heavy lifting and straining with exercise and work. Do not hold your breath when you perform mild to moderate lifting and exercise activities. Limit your activities as directed by your health care provider. Do Kegel exercises as directed by your health care provider. To do this: Squeeze your pelvic floor muscles tight. You should feel a tight lift in your rectal area and a tightness in your vaginal area. Keep your stomach, buttocks, and legs relaxed. Hold the muscles tight for up to 10 seconds. Then relax your muscles. Repeat this exercise 50 times a day, or as much as told by your health care provider. Continue to do this exercise for at least 4-6 weeks, or for as long as told by your health care provider.  General instructions Take over-the-counter and prescription medicines only as told by  your health care provider. Wear a sanitary pad or adult diapers if you have urinary incontinence. If you have a pessary, take care of it as told by your health care provider. Keep all follow-up visits. This is important. Contact a health care provider if you: Have symptoms that interfere with your daily activities or sex life. Need medicine to help with the discomfort. Notice bleeding from your vagina that is not related to your menstrual period. Have a fever. Have pain or bleeding when you urinate. Have bleeding when you pass stool. Pass urine when you have sex. Have chronic constipation. Have a pessary that falls out. Have a foul-smelling vaginal discharge. Have an unusual, low pain in your abdomen. Get help right away if you: Cannot pass urine. Summary Pelvic organ prolapse is the stretching, bulging, or dropping of pelvic organs into an abnormal position. It happens when the muscles and tissues that surround and support pelvic structures become weak or stretched. When organs other than the vagina are involved, they often bulge into the vagina or protrude from it, depending on how severe the prolapse is. In most cases, this condition needs to be treated only if it produces symptoms. Treatment may include lifestyle changes, estrogen, Kegel exercises, pessary insertion, or surgery. Avoid heavy lifting and straining with exercise and work. Do not hold your breath when you perform mild to moderate lifting and exercise activities. Limit your activities as directed by your health care provider. This information is not intended to replace advice given to you by your health care provider. Make sure you discuss any questions you have with your health care provider. Document Revised: 03/21/2020 Document Reviewed: 03/21/2020 Elsevier Patient Education  [place].   Anterior and Posterior Colporrhaphy and Sling Procedure Anterior and posterior colporrhaphy and sling procedure are  combined surgical procedures that treat weakness in the front wall (anterior) or back wall (posterior) of the vagina. This weakness can result in a condition called pelvic organ prolapse. Pelvic organ prolapse can cause urine leakage, a condition called incontinence. In this procedure, a surgical mesh sling is placed around the urethra. The urethra is the tube that empties urine from the bladder. The sling will hold the urethra and bladder in place to prevent urine leakage. This surgery is usually done through incisions in the vagina. It can also be done through incisions in the lower abdominal or groin area. You may need this surgery if pelvic organ prolapse causes symptoms that interfere with your daily life and cannot be corrected with other treatments. The type of colporrhaphy that is done depends on the type of prolapse. Types of pelvic organ prolapse include the following: Cystocele. This is a prolapse of the bladder and the upper part of the front wall of the vagina. Rectocele. This is a prolapse of the rectum and the lower part of the back wall of the vagina. Enterocele. This is a prolapse of the small intestine. It appears as a bulge under the neck of the uterus at the top of the back wall of the vagina. Procidentia. This is a complete prolapse of the uterus and the cervix. The prolapse can be seen and felt coming out of the vagina. Tell a health care provider about: Any allergies you have. All medicines you are taking, including vitamins, herbs, eye drops, creams, and over-the-counter medicines. Any problems you or family members have had with anesthetic medicines. Any blood disorders you have. Any  surgeries you have had. Any medical conditions you have. History of smoking or alcohol use. Whether you are pregnant or may be pregnant. What are the risks? Generally, this is a safe procedure. However, problems may occur, including: Infection. Bleeding. Allergic reactions to  medicines. Damage to nearby structures, organs, or nerves. Incontinence. A blood clot that travels to your lung. Painful sex. Urine leakage into the vagina. Constipation. Tissue damage from the sling over time. The sling may need to be removed. What happens before the procedure? Staying hydrated Follow instructions from your health care provider about hydration, which may include: Up to 2 hours before the procedure - you may continue to drink clear liquids, such as water, clear fruit juice, black coffee, and plain tea.  Eating and drinking restrictions Follow instructions from your health care provider about eating and drinking, which may include: 8 hours before the procedure - stop eating heavy meals or foods, such as meat, fried foods, or fatty foods. 6 hours before the procedure - stop eating light meals or foods, such as toast or cereal. 6 hours before the procedure - stop drinking milk or drinks that contain milk. 2 hours before the procedure - stop drinking clear liquids. Medicines Ask your health care provider about: Changing or stopping your regular medicines. This is especially important if you are taking diabetes medicines or blood thinners. Taking medicines such as aspirin and ibuprofen. These medicines can thin your blood. Do not take these medicines unless your health care provider tells you to take them. Taking over-the-counter medicines, vitamins, herbs, and supplements. Surgery safety Ask your health care provider what steps will be taken to help prevent infection. These steps may include: Removing hair at the surgery site. Washing skin with a germ-killing soap. Taking antibiotic medicine. General instructions You may be instructed to use estrogen cream in your vagina to help prevent complications and promote healing. Do not use any products that contain nicotine or tobacco for at least 4 weeks before the procedure. These products include cigarettes, chewing tobacco,  and vaping devices, such as e-cigarettes. If you need help quitting, ask your health care provider. Plan to have a responsible adult take you home from the hospital or clinic. Arrange for someone to help you with activities during recovery. Plan to have a responsible adult care for you for the time you are told after you leave the hospital or clinic. This is important. What happens during the procedure?  An IV will be inserted into one of your veins. You will be given one or more of the following: A medicine to help you relax (sedative). A medicine that is injected into your spine to numb the area below and slightly above the injection site (spinal anesthetic). A medicine to make you fall asleep (general anesthetic). You will lie down on the operating table with your feet in stirrups. A small, thin tube (catheter) will be inserted through your urethra into your bladder to drain urine during surgery and recovery. An instrument (vaginal speculum) will be used to hold your vagina open. Your health care provider will perform the procedure according to the type of repair you require. To correct a cystocele: An incision will be made in the front wall of your vagina. Your bladder will be placed back into normal position. Weak or excess vaginal lining may be removed. The front wall of your vagina will be closed with stitches (sutures). To correct a rectocele or enterocele: An incision will be made in the back wall of  your vagina. Your rectum or small intestine will be placed back into normal position. Weak or excess vaginal lining may be removed. The back wall of your vagina will be closed with sutures. To correct a procidentia: An incision will be made in the back and front walls of your vagina. Your uterus will be placed back into normal position. Weak or excess vaginal lining may be removed. The front and back wall of your vagina will be closed with sutures. To place a sling: Small  incisions may be made inside your vagina or in your lower abdominal or groin area to insert the sling. The sling will be placed around your urethra and attached to strong tissues inside your abdomen. These incisions may be closed with sutures or glue. Gauze packing will be placed inside your vagina. The procedure may vary among health care providers and hospitals. What happens after the procedure? Your blood pressure, heart rate, breathing rate, and blood oxygen level will be monitored until you leave the hospital or clinic. You will be given pain medicine as needed. You will be encouraged to eat and drink a regular diet. You will be encouraged to get up and walk as soon as you are able. You may need to wear compression stockings. These stockings help to prevent blood clots and reduce swelling in your legs. Your IV, urinary catheter, and vaginal packing may be removed before you go home. Summary Anterior and posterior colporrhaphy and sling procedure are combined surgical procedures that treat weakness in the anterior or posteriorwalls of the vagina. Plan to have a responsible adult take you home from the hospital or clinic. After the procedure, you will be encouraged to get up and walk as soon as you are able. The IV, urinary catheter, and vaginal packing may be removed before you go home. This information is not intended to replace advice given to you by your health care provider. Make sure you discuss any questions you have with your health care provider. Document Revised: 04/11/2021 Document Reviewed: 03/22/2020 Elsevier Patient Education  [place].

## 2021-10-04 NOTE — Telephone Encounter (Signed)
F/u with patient after 12/28 OV.   Urodynamics OV scheduled with Dr. Quincy Simmonds on 10/16/21 at 1130. Patient instructed to arrive with full bladder. Order placed.  UA scheduled for 10/11/21 at 0800 -future lab order placed.   Reviewed alternative dates for surgery. Will wait for updated surgery information from Dr. Quincy Simmonds. Once info received, will update surgery and notify patient. Patient verbalizes understanding and is agreeable.   Routing to Dr. Quincy Simmonds  Cc: Angie Fava Carder

## 2021-10-05 LAB — HEMOGLOBIN A1C
Hgb A1c MFr Bld: 5.6 % of total Hgb (ref <5.7)
Mean Plasma Glucose: 114 mg/dL
eAG (mmol/L): 6.3 mmol/L

## 2021-10-10 ENCOUNTER — Encounter: Payer: 59 | Admitting: Obstetrics and Gynecology

## 2021-10-10 NOTE — Telephone Encounter (Signed)
Dr. Quincy Simmonds -can you update anticipated surgical plan changes, so that I can verify time with OR for 10/23/21?

## 2021-10-10 NOTE — Telephone Encounter (Signed)
Thank you for the update!

## 2021-10-10 NOTE — Telephone Encounter (Signed)
Planned surgery:  total laparoscopic hysterectomy with bilateral salpingo-oophorectomy, vaginal morcellation of uterus in Alexis bag, anterior and posterior colporrhaphy, TVT Exact midurethral sling and cystoscopy.   Time needed will be 4 hours.   Assistant needed.

## 2021-10-10 NOTE — Telephone Encounter (Signed)
Spoke with patient. Patient aware of new surgery date, 11/06/21 at [place]. Advised patient I will f/u once urodynamics/pre-op completed.  Patient verbalizes understanding and is agreeable.   Routing to Dr. Antony Blackbird

## 2021-10-10 NOTE — Telephone Encounter (Signed)
Spoke with [employer] in [employer]. Surgery updated, moved to 11/06/21 at 0730 to accommodate changes.   Routing to [employer]

## 2021-10-11 ENCOUNTER — Other Ambulatory Visit: Payer: 59

## 2021-10-11 ENCOUNTER — Other Ambulatory Visit: Payer: Self-pay

## 2021-10-11 DIAGNOSIS — Z01812 Encounter for preprocedural laboratory examination: Secondary | ICD-10-CM

## 2021-10-11 DIAGNOSIS — N393 Stress incontinence (female) (male): Secondary | ICD-10-CM

## 2021-10-12 NOTE — Progress Notes (Signed)
GYNECOLOGY  VISIT   HPI: 61 y.o.   Married  Caucasian  female   [id number] with Patient's last menstrual period was 07/08/2020 (approximate).   here for simple urodynamics and pre-op visit.   Has urinary stress incontinence, cystocele, rectocele, simple endometrial hyperplasia and fibroids.  She is interested in surgery for all of the above.  She presented with postmenopausal bleeding that lead to her endometrial biopsy showing the simple hyperplasia.  She has known fibroids from her current and prior ultrasounds.  Her largest fibroid is 5.85 cm and her uterine size is 9.84 x 10.24 x 10.98 cm. Her ovaries are normal.    She leaks urine with cough, sneeze, and exercise.  Jumping on trampoline starting 20 years ago caused leakage of urine for her.  Voids about every 2 - 3 hours.  She feels like she does not empty well.  She does splint to have bowel movements. She has done some training with pelvic floor therapist.   Hx 2 vaginal deliveries.   Prior to the visit today, she had 1 cup coffee today, 1/4 can caffeine soda, 40 ounces water.  GYNECOLOGIC HISTORY: Patient's last menstrual period was 07/08/2020 (approximate). Contraception:  Tubal Menopausal hormone therapy:  none Last mammogram:  08-21-21 category c density birads 1:neg Last pap smear:  06-15-21 neg HPV HV neg, 03-28-16 neg HPV HR neg, 02-04-14 neg HPV HR neg               OB History     Gravida  0   Para  0   Term  0   Preterm      AB  0   Living         SAB      IAB      Ectopic  0   Multiple      Live Births                 Patient Active Problem List   Diagnosis Date Noted   At risk for depression 03/15/2021   At risk for deficient intake of food 12/19/2020   Prediabetes 12/19/2020   At risk for malnutrition 11/09/2020   SOBOE (shortness of breath on exertion) 10/13/2020   Fatigue 10/13/2020   Other hyperlipidemia 10/13/2020   OSA on CPAP 10/13/2020   Vitamin D deficiency 10/13/2020    Gastroesophageal reflux disease 10/13/2020   Mood disorder ([place]), with emotional eating 10/13/2020   At risk for heart disease 10/13/2020   Ingrown toenail 05/17/2020   Plantar fasciitis 05/17/2020   Social anxiety disorder     Past Medical History:  Diagnosis Date   Acid reflux    Anxiety    Back pain    Bladder prolapse, female, acquired    C. difficile colitis    Chondromalacia    COVID 08/12/2021   Depression    Dysplasia of cervix, low grade (CIN 1) 1991   Fibroid    Fibroids    uterine   Heartburn    High cholesterol    History of COVID-19 06/05/2021   HSV-1 infection 2010   Lumbar spondylosis    Mild depression    Palpitations    Plantar fasciitis, bilateral    Simple endometrial hyperplasia without atypia 08/2021   Sleep apnea    SOBOE (shortness of breath on exertion)    Social anxiety disorder 1998   Swallowing difficulty    Swelling of both lower extremities    Vitamin D deficiency  Past Surgical History:  Procedure Laterality Date   APPENDECTOMY     AUGMENTATION MAMMAPLASTY     BREAST ENHANCEMENT SURGERY  10/08/2006   Saline Harlow Mares)   TUBAL LIGATION      Current Outpatient Medications  Medication Sig Dispense Refill   buPROPion (WELLBUTRIN SR) 150 MG 12 hr tablet Pt to increase dose to BID 30 tablet 0   Celecoxib (CELEBREX PO) Take by mouth daily.     escitalopram (LEXAPRO) 20 MG tablet Take 20 mg by mouth daily.     neomycin-polymyxin-hydrocortisone (CORTISPORIN) 3.5-10000-1 OTIC suspension 4 drops 3 (three) times daily.     omeprazole (PRILOSEC) 20 MG capsule Take 20 mg by mouth as needed.     rosuvastatin (CRESTOR) 10 MG tablet SMARTSIG:1 Tablet(s) By Mouth Every Evening     triamcinolone cream (KENALOG) 0.1 % APPLY TO RASH ON HANDS TWICE DAILY AS NEEDED  2   valACYclovir (VALTREX) 500 MG tablet Take one tablet (500 mg) by mouth daily.  Take one tablet (500 mg) by mouth twice daily for an outbreak. 100 tablet 3   Vitamin D, Ergocalciferol,  (DRISDOL) 1.25 MG (50000 UNIT) CAPS capsule Take 1 capsule (50,000 Units total) by mouth every 7 (seven) days. 4 capsule 0   No current facility-administered medications for this visit.     ALLERGIES: Cephalosporins  Family History  Problem Relation Age of Onset   Cancer Father        lung   Thyroid disease Father    Cancer Brother        testicular cancer   Osteoarthritis Mother    Migraines Mother    Thyroid disease Mother    Depression Mother    Anxiety disorder Mother    Obesity Mother    Osteoarthritis Maternal Grandmother    Hypertension Paternal Grandmother    Stroke Paternal Grandmother     Social History   Socioeconomic History   Marital status: Married    Spouse name: R Tomi Bamberger   Number of children: Not on file   Years of education: Not on file   Highest education level: Not on file  Occupational History   Not on file  Tobacco Use   Smoking status: Former    Years: 10.00    Types: Cigarettes    Quit date: 2012    Years since quitting: 11.0   Smokeless tobacco: Never  Vaping Use   Vaping Use: Never used  Substance and Sexual Activity   Alcohol use: Not Currently    Comment: 1-2 glasses of wine per month   Drug use: No   Sexual activity: Yes    Partners: Male    Birth control/protection: Surgical, Post-menopausal    Comment: BTL  Other Topics Concern   Not on file  Social History Narrative   Not on file   Social Determinants of Health   Financial Resource Strain: Not on file  Food Insecurity: Not on file  Transportation Needs: Not on file  Physical Activity: Not on file  Stress: Not on file  Social Connections: Not on file  Intimate Partner Violence: Not on file    Review of Systems  All other systems reviewed and are negative.  PHYSICAL EXAMINATION:    BP 100/64    Pulse 60    Ht 5\' 3"  (1.6 m)    Wt 186 lb (84.4 kg)    LMP 07/08/2020 (Approximate)    SpO2 98%    BMI 32.95 kg/m     General appearance:  alert, cooperative and appears  stated age Head: Normocephalic, without obvious abnormality, atraumatic Neck: no adenopathy, supple, symmetrical, trachea midline and thyroid normal to inspection and palpation Lungs: clear to auscultation bilaterally Breasts: normal appearance, no masses or tenderness, No nipple retraction or dimpling, No nipple discharge or bleeding, No axillary or supraclavicular adenopathy Heart: regular rate and rhythm Abdomen: soft, non-tender, no masses,  no organomegaly Extremities: extremities normal, atraumatic, no cyanosis or edema Skin: Skin color, texture, turgor normal. No rashes or lesions Lymph nodes: Cervical, supraclavicular, and axillary nodes normal. No abnormal inguinal nodes palpated Neurologic: Grossly normal  Pelvic: External genitalia:  no lesions              Urethra:  normal appearing urethra with no masses, tenderness or lesions              Bartholins and Skenes: normal                 Vagina: normal appearing vagina with normal color and discharge, no lesions.  Second degree cystocele, first degree rectocele.              Cervix: no lesions                Bimanual Exam:  Uterus:  10 week size and nontender.              Adnexa: no mass, fullness, tenderness              Rectal exam: yes.  Confirms.              Anus:  normal sphincter tone, no lesions  Chaperone was present for exam:  Estill Bamberg, CMA  Simple urodynamic testing Voided 300 cc.  Sterile prep of urethra with betadine.  12 French catheter placed.  PVR 250 cc of clear, yellow urine noted.  Bladder filling with sterile water:   150 cc - 1st sensation 250 cc - 2nd sensation 335 cc - 3rd sensation No signs of detrusor instability.  Valsalva produced leakage of urine consistent with stress incontinence.   ASSESSMENT  Fibroids - 10 week size.  Simple endometrial hyperplasia without atypia.  Cystocele.  Stress incontinence.  Incomplete bladder emptying, which I think is most likely due to her prolapse.   Rectocele.  Uses CPAP.  PLAN  Urodynamic testing reviewed.  We discussed midurethral sling versus Freada Bergeron plication versus no urethral surgery at the time of her laparoscopic hysterectomy/BSO/anterior and posterior colporrhaphy with cystoscopy.  We have discussed risks and benefits of urethral surgery for urinary incontinence and I did high light the permanent nature of the sling and mesh complications and risk of urinary retention with any anti-incontinence procedures which may result in need for catheterization, self catheterization, and reoperation to loosen or cut the sling.    Patient will proceed with total laparoscopic hysterectomy with bilateral salpingo-oophorectomy, vaginal morcellation of uterus in Alexis bag, anterior and posterior colporrhaphy, TVT Exact Midurethral sling, and cystoscopy.  Risks, benefits, and alternatives have been reviewed.   Recovery expectations also discussed.   She will bring her CPAP to the hospital with her.   An After Visit Summary was printed and given to the patient.  30 min  total time was spent for this patient encounter, including preparation, face-to-face counseling with the patient, coordination of care, and documentation of the encounter.

## 2021-10-13 LAB — URINALYSIS, COMPLETE W/RFL CULTURE
Bilirubin Urine: NEGATIVE
Glucose, UA: NEGATIVE
Hyaline Cast: NONE SEEN /LPF
Ketones, ur: NEGATIVE
Leukocyte Esterase: NEGATIVE
Nitrites, Initial: NEGATIVE
Specific Gravity, Urine: 1.02 (ref 1.001–1.035)
WBC, UA: NONE SEEN /HPF (ref 0–5)
pH: 5.5 (ref 5.0–8.0)

## 2021-10-13 LAB — URINE CULTURE
MICRO NUMBER:: 12825985
SPECIMEN QUALITY:: ADEQUATE

## 2021-10-13 LAB — CULTURE INDICATED

## 2021-10-16 ENCOUNTER — Encounter: Payer: Self-pay | Admitting: Obstetrics and Gynecology

## 2021-10-16 ENCOUNTER — Other Ambulatory Visit: Payer: Self-pay

## 2021-10-16 ENCOUNTER — Ambulatory Visit: Payer: 59 | Admitting: Obstetrics and Gynecology

## 2021-10-16 VITALS — BP 100/64 | HR 60 | Ht 63.0 in | Wt 186.0 lb

## 2021-10-16 DIAGNOSIS — R339 Retention of urine, unspecified: Secondary | ICD-10-CM | POA: Diagnosis not present

## 2021-10-16 DIAGNOSIS — N393 Stress incontinence (female) (male): Secondary | ICD-10-CM | POA: Diagnosis not present

## 2021-10-16 DIAGNOSIS — N8110 Cystocele, unspecified: Secondary | ICD-10-CM | POA: Diagnosis not present

## 2021-10-16 DIAGNOSIS — N816 Rectocele: Secondary | ICD-10-CM | POA: Diagnosis not present

## 2021-10-16 NOTE — Telephone Encounter (Signed)
Spoke with patient. Surgery date request confirmed.  Advised surgery is scheduled for  Surgery instruction sheet and hospital brochure reviewed, printed copy will be mailed/picked up in office at pre-op visit.  Patient advised if Covid screening and quarantine requirements and agreeable.  Instructed patient to use Fleets enema night before surgery.   Routing to provider. Encounter closed.  CC: Hayley Carder

## 2021-10-19 ENCOUNTER — Encounter (HOSPITAL_COMMUNITY)
Admission: RE | Admit: 2021-10-19 | Discharge: 2021-10-19 | Disposition: A | Discharge status: Home / Self Care | Payer: 59 | Source: Ambulatory Visit | Attending: Family Medicine | Admitting: Family Medicine

## 2021-10-19 NOTE — Telephone Encounter (Signed)
Spoke with patient regarding surgery benefits. Patient acknowledges understanding of information presented. Patient is aware that benefits presented are professional benefits only. Patient is aware the hospital will call with facility benefits. See account note.    

## 2021-10-25 ENCOUNTER — Encounter (HOSPITAL_BASED_OUTPATIENT_CLINIC_OR_DEPARTMENT_OTHER): Payer: Self-pay | Admitting: Obstetrics and Gynecology

## 2021-10-26 ENCOUNTER — Encounter (HOSPITAL_BASED_OUTPATIENT_CLINIC_OR_DEPARTMENT_OTHER): Payer: Self-pay | Admitting: Obstetrics and Gynecology

## 2021-10-26 ENCOUNTER — Other Ambulatory Visit: Payer: Self-pay

## 2021-10-26 DIAGNOSIS — N8501 Benign endometrial hyperplasia: Secondary | ICD-10-CM | POA: Diagnosis not present

## 2021-10-26 DIAGNOSIS — Z01818 Encounter for other preprocedural examination: Secondary | ICD-10-CM | POA: Diagnosis present

## 2021-10-26 NOTE — Progress Notes (Signed)
PLEASE WEAR A MASK OUT IN PUBLIC AND SOCIAL DISTANCE AND [place] YOUR HANDS FREQUENTLY. PLEASE ASK ALL YOUR CLOSE HOUSEHOLD CONTACT TO WEAR MASK OUT IN PUBLIC AND SOCIAL DISTANCE AND [place] HANDS FREQUENTLY ALSO.      Your procedure is scheduled on Monday 11/06/21.  Report to [facility].   Call this number if you have problems the morning of surgery  :[phone].   OUR ADDRESS IS [place].  WE ARE LOCATED IN [facility].  PLEASE BRING YOUR INSURANCE CARD AND PHOTO ID DAY OF SURGERY.  ONLY ONE PERSON ALLOWED IN FACILITY WAITING AREA.                                     REMEMBER:  DO NOT EAT FOOD, CANDY GUM OR MINTS  AFTER MIDNIGHT THE NIGHT BEFORE YOUR SURGERY . YOU MAY HAVE CLEAR LIQUIDS FROM MIDNIGHT THE NIGHT BEFORE YOUR SURGERY UNTIL  4:30 am. NO CLEAR LIQUIDS AFTER  4:30 am DAY OF SURGERY.   YOU MAY  BRUSH YOUR TEETH MORNING OF SURGERY AND RINSE YOUR MOUTH OUT, NO CHEWING GUM CANDY OR MINTS.    CLEAR LIQUID DIET   Foods Allowed                                                                     Foods Excluded  Coffee and tea, regular and decaf                             liquids that you cannot  Plain Jell-O any favor except red or purple                                           see through such as: Fruit ices (not with fruit pulp)                                     milk, soups, orange juice  Iced Popsicles                                    All solid food Carbonated beverages, regular and diet                                    Cranberry, grape and apple juices Sports drinks like Gatorade  Sample Menu Breakfast                                Lunch  Supper Cranberry juice                                           Jell-O                                     Grape juice                           Apple juice Coffee or tea                        Jell-O                                       Popsicle                                                Coffee or tea                        Coffee or tea  _____________________________________________________________________     TAKE THESE MEDICATIONS MORNING OF SURGERY WITH A SIP OF WATER:  Wellbutrin, Lexapro, Prilosec, Valtrex prn, Cortisporin ear drops prn  ONE VISITOR IS ALLOWED IN WAITING ROOM ONLY DAY OF SURGERY.  YOU MAY HAVE ANOTHER PERSON SWITCH OUT WITH THE  1  VISITOR IN THE WAITING ROOM DAY OF SURGERY AND A MASK MUST BE WORN IN THE WAITING ROOM.    2 VISITORS  MAY VISIT IN THE EXTENDED RECOVERY ROOM UNTIL 800 PM ONLY 1 VISITOR AGE 25 AND OVER MAY SPEND THE NIGHT AND MUST BE IN EXTENDED RECOVERY ROOM NO LATER THAN 800 PM .    UP TO 2 CHILDREN AGE 84 TO 15 MAY ALSO VISIT IN EXTENDED RECOV ERY ROOM ONLY UNTIL 800 PM AND MUST LEAVE BY 800 PM.   ALL PERSONS VISITING IN EXTENDED RECOVERY ROOM MUST WEAR A MASK.                                    DO NOT WEAR JEWERLY, MAKE UP. DO NOT WEAR LOTIONS, POWDERS, PERFUMES OR NAIL POLISH ON YOUR FINGERNAILS. TOENAIL POLISH IS OK TO WEAR. DO NOT SHAVE FOR 48 HOURS PRIOR TO DAY OF SURGERY. MEN MAY SHAVE FACE AND NECK. CONTACTS, GLASSES, OR DENTURES MAY NOT BE WORN TO SURGERY.                                    [facility] IS NOT RESPONSIBLE  FOR ANY BELONGINGS.                                                                    [facility]  [facility] - Preparing for Surgery Before surgery, you can play an important role.  Because skin is not sterile, your skin needs to be as free of germs as possible.  You can reduce the number of germs on your skin by washing with CHG (chlorahexidine gluconate) soap before surgery.  CHG is an antiseptic cleaner which kills germs and bonds with the skin to continue killing germs even after washing. Please DO NOT use if you have an allergy to CHG or antibacterial soaps.  If your skin becomes reddened/irritated stop using the CHG and inform your nurse  when you arrive at Short Stay. Do not shave (including legs and underarms) for at least 48 hours prior to the first CHG shower.  You may shave your face/neck. Please follow these instructions carefully:  1.  Shower with CHG Soap the night before surgery and the  morning of Surgery.  2.  If you choose to wash your hair, wash your hair first as usual with your  normal  shampoo.  3.  After you shampoo, rinse your hair and body thoroughly to remove the  shampoo.                            4.  Use CHG as you would any other liquid soap.  You can apply chg directly  to the skin and wash                      Gently with a scrungie or clean washcloth.  5.  Apply the CHG Soap to your body ONLY FROM THE NECK DOWN.   Do not use on face/ open                           Wound or open sores. Avoid contact with eyes, ears mouth and genitals (private parts).                       Wash face,  Genitals (private parts) with your normal soap.             6.  Wash thoroughly, paying special attention to the area where your surgery  will be performed.  7.  Thoroughly rinse your body with warm water from the neck down.  8.  DO NOT shower/wash with your normal soap after using and rinsing off  the CHG Soap.                9.  Pat yourself dry with a clean towel.            10.  Wear clean pajamas.            11.  Place clean sheets on your bed the night of your first shower and do not  sleep with pets. Day of Surgery : Do not apply any lotions/deodorants the morning of surgery.  Please wear clean clothes to the hospital/surgery center.  IF YOU HAVE ANY SKIN IRRITATION OR PROBLEMS WITH THE SURGICAL SOAP, PLEASE GET A BAR OF GOLD DIAL SOAP AND SHOWER THE NIGHT BEFORE YOUR SURGERY AND THE MORNING OF YOUR SURGERY. PLEASE LET THE NURSE KNOW MORNING OF YOUR SURGERY IF YOU HAD ANY PROBLEMS WITH THE SURGICAL SOAP.  FAILURE TO FOLLOW THESE INSTRUCTIONS MAY RESULT IN THE CANCELLATION OF YOUR SURGERY PATIENT  SIGNATURE_________________________________  NURSE SIGNATURE___Kelly Harlow Mares, RN_______________________________  ________________________________________________________________________  QUESTIONS [facility] PRE OP NURSE PHONE [phone].

## 2021-10-26 NOTE — Progress Notes (Addendum)
Spoke w/ via phone for pre-op interview---pt Lab needs dos----BMP  11/01/21 Shelia called from lab and stated that they were unable to do the BMP because the blood had hemolyzed. Will need to be done day of surgery. Order for BMP was put back in.              Lab results------11/01/21 lab appt for CBC, type & screen, EKG 10/11/2020 Chest xray COVID test -----patient states asymptomatic no test needed Arrive at -------0530 on 11/06/21 NPO after MN NO Solid Food.  Clear liquids from MN until---0430 Med rec completed Medications to take morning of surgery -----Wellbutrin, Lexapro, Prilosec, Valtrex prn, Cortisporin ear drops prn Diabetic medication -----n/a Patient instructed no nail polish to be worn day of surgery Patient instructed to bring photo id and insurance card day of surgery Patient aware to have Driver (ride ) / caregiver    for 24 hours after surgery - husband Tomi Bamberger Patient Special Instructions -----Bring CPAP. Extended recovery instructions given. Patient instructed that if she wears contact lenses the day of surgery, they must be taken out prior to surgery. She should bring contact case and solution. Pre-Op special Istructions -----none Patient verbalized understanding of instructions that were given at this phone interview. Patient denies chest pain, fever, cough at this phone interview.  Patient reports SOB with exertion. Per MD note on 10/13/20 OV with Dr. Jeanne Ivan is likely due to obesity and exercise induced.

## 2021-11-01 ENCOUNTER — Other Ambulatory Visit: Payer: Self-pay

## 2021-11-01 ENCOUNTER — Encounter (HOSPITAL_COMMUNITY)
Admission: RE | Admit: 2021-11-01 | Discharge: 2021-11-01 | Disposition: A | Discharge status: Home / Self Care | Payer: 59 | Source: Ambulatory Visit | Attending: Obstetrics and Gynecology | Admitting: Obstetrics and Gynecology

## 2021-11-01 VITALS — BP 109/52 | HR 74 | Temp 98.7°F | Resp 16 | Ht 64.0 in | Wt 193.0 lb

## 2021-11-01 DIAGNOSIS — N8501 Benign endometrial hyperplasia: Secondary | ICD-10-CM | POA: Diagnosis not present

## 2021-11-01 DIAGNOSIS — Z01818 Encounter for other preprocedural examination: Secondary | ICD-10-CM | POA: Insufficient documentation

## 2021-11-01 LAB — CBC
HCT: 42.5 % (ref 36.0–46.0)
Hemoglobin: 14 g/dL (ref 12.0–15.0)
MCH: 29.1 pg (ref 26.0–34.0)
MCHC: 32.9 g/dL (ref 30.0–36.0)
MCV: 88.4 fL (ref 80.0–100.0)
Platelets: 279 10*3/uL (ref 150–400)
RBC: 4.81 MIL/uL (ref 3.87–5.11)
RDW: 12.8 % (ref 11.5–15.5)
WBC: 5.1 10*3/uL (ref 4.0–10.5)
nRBC: 0 % (ref 0.0–0.2)

## 2021-11-02 ENCOUNTER — Encounter (INDEPENDENT_AMBULATORY_CARE_PROVIDER_SITE_OTHER): Payer: Self-pay

## 2021-11-05 NOTE — Anesthesia Preprocedure Evaluation (Addendum)
Anesthesia Evaluation  Patient identified by MRN, date of birth, ID band Patient awake    Reviewed: Allergy & Precautions, NPO status , Patient's Chart, lab work & pertinent test results  Airway Mallampati: II  TM Distance: >3 FB Neck ROM: Full    Dental no notable dental hx. (+) Dental Advisory Given, Teeth Intact   Pulmonary sleep apnea , former smoker,    Pulmonary exam normal breath sounds clear to auscultation       Cardiovascular negative cardio ROS Normal cardiovascular exam Rhythm:Regular Rate:Normal     Neuro/Psych PSYCHIATRIC DISORDERS Anxiety Depression negative neurological ROS     GI/Hepatic Neg liver ROS, GERD  ,  Endo/Other  negative endocrine ROS  Renal/GU negative Renal ROS     Musculoskeletal  (+) Arthritis ,   Abdominal (+) + obese,   Peds  Hematology negative hematology ROS (+)   Anesthesia Other Findings   Reproductive/Obstetrics                            Anesthesia Physical Anesthesia Plan  ASA: 2  Anesthesia Plan: General   Post-op Pain Management: Minimal or no pain anticipated, Tylenol PO (pre-op) and Toradol IV (intra-op)   Induction: Intravenous  PONV Risk Score and Plan: 4 or greater and Ondansetron, Dexamethasone, Treatment may vary due to age or medical condition, Midazolam and Scopolamine patch - Pre-op  Airway Management Planned: Oral ETT  Additional Equipment: None  Intra-op Plan:   Post-operative Plan: Extubation in OR  Informed Consent: I have reviewed the patients History and Physical, chart, labs and discussed the procedure including the risks, benefits and alternatives for the proposed anesthesia with the patient or authorized representative who has indicated his/her understanding and acceptance.     Dental advisory given  Plan Discussed with: CRNA  Anesthesia Plan Comments:        Anesthesia Quick Evaluation

## 2021-11-06 ENCOUNTER — Other Ambulatory Visit: Payer: Self-pay

## 2021-11-06 ENCOUNTER — Encounter (HOSPITAL_BASED_OUTPATIENT_CLINIC_OR_DEPARTMENT_OTHER): Payer: Self-pay | Admitting: Obstetrics and Gynecology

## 2021-11-06 ENCOUNTER — Ambulatory Visit (INDEPENDENT_AMBULATORY_CARE_PROVIDER_SITE_OTHER): Payer: 59 | Admitting: Family Medicine

## 2021-11-06 ENCOUNTER — Observation Stay (HOSPITAL_BASED_OUTPATIENT_CLINIC_OR_DEPARTMENT_OTHER)
Admission: RE | Admit: 2021-11-06 | Discharge: 2021-11-07 | Disposition: A | Discharge status: Home / Self Care | Payer: 59 | Attending: Obstetrics and Gynecology | Admitting: Obstetrics and Gynecology

## 2021-11-06 ENCOUNTER — Observation Stay (HOSPITAL_BASED_OUTPATIENT_CLINIC_OR_DEPARTMENT_OTHER): Payer: 59 | Admitting: Anesthesiology

## 2021-11-06 ENCOUNTER — Encounter (HOSPITAL_BASED_OUTPATIENT_CLINIC_OR_DEPARTMENT_OTHER)
Admission: RE | Disposition: A | Discharge status: Home / Self Care | Payer: Self-pay | Source: Home / Self Care | Attending: Obstetrics and Gynecology

## 2021-11-06 DIAGNOSIS — N838 Other noninflammatory disorders of ovary, fallopian tube and broad ligament: Secondary | ICD-10-CM | POA: Insufficient documentation

## 2021-11-06 DIAGNOSIS — N816 Rectocele: Secondary | ICD-10-CM

## 2021-11-06 DIAGNOSIS — N8501 Benign endometrial hyperplasia: Secondary | ICD-10-CM

## 2021-11-06 DIAGNOSIS — Z87891 Personal history of nicotine dependence: Secondary | ICD-10-CM | POA: Insufficient documentation

## 2021-11-06 DIAGNOSIS — D270 Benign neoplasm of right ovary: Secondary | ICD-10-CM | POA: Diagnosis not present

## 2021-11-06 DIAGNOSIS — G4733 Obstructive sleep apnea (adult) (pediatric): Secondary | ICD-10-CM | POA: Diagnosis not present

## 2021-11-06 DIAGNOSIS — N736 Female pelvic peritoneal adhesions (postinfective): Secondary | ICD-10-CM

## 2021-11-06 DIAGNOSIS — Z8616 Personal history of COVID-19: Secondary | ICD-10-CM | POA: Diagnosis not present

## 2021-11-06 DIAGNOSIS — N393 Stress incontinence (female) (male): Secondary | ICD-10-CM | POA: Insufficient documentation

## 2021-11-06 DIAGNOSIS — N8110 Cystocele, unspecified: Secondary | ICD-10-CM | POA: Insufficient documentation

## 2021-11-06 DIAGNOSIS — D259 Leiomyoma of uterus, unspecified: Secondary | ICD-10-CM | POA: Diagnosis not present

## 2021-11-06 DIAGNOSIS — Z01818 Encounter for other preprocedural examination: Secondary | ICD-10-CM

## 2021-11-06 DIAGNOSIS — R338 Other retention of urine: Secondary | ICD-10-CM | POA: Diagnosis not present

## 2021-11-06 DIAGNOSIS — Z90710 Acquired absence of both cervix and uterus: Secondary | ICD-10-CM | POA: Diagnosis present

## 2021-11-06 HISTORY — PX: ANTERIOR (CYSTOCELE) AND POSTERIOR REPAIR (RECTOCELE) WITH XENFORM GRAFT AND SACROSPINOUS FIXATION: SHX6492

## 2021-11-06 HISTORY — PX: TOTAL LAPAROSCOPIC HYSTERECTOMY WITH SALPINGECTOMY: SHX6742

## 2021-11-06 HISTORY — DX: Presence of spectacles and contact lenses: Z97.3

## 2021-11-06 HISTORY — DX: Personal history of other specified conditions: Z87.898

## 2021-11-06 HISTORY — PX: CYSTOSCOPY: SHX5120

## 2021-11-06 HISTORY — PX: BLADDER SUSPENSION: SHX72

## 2021-11-06 LAB — BASIC METABOLIC PANEL
Anion gap: 9 (ref 5–15)
BUN: 14 mg/dL (ref 6–20)
CO2: 22 mmol/L (ref 22–32)
Calcium: 9.4 mg/dL (ref 8.9–10.3)
Chloride: 106 mmol/L (ref 98–111)
Creatinine, Ser: 0.71 mg/dL (ref 0.44–1.00)
GFR, Estimated: >60 mL/min (ref >60)
Glucose, Bld: 97 mg/dL (ref 70–99)
Potassium: 3.8 mmol/L (ref 3.5–5.1)
Sodium: 137 mmol/L (ref 135–145)

## 2021-11-06 LAB — ABO/RH: ABO/RH(D): B NEG

## 2021-11-06 LAB — TYPE AND SCREEN
ABO/RH(D): B NEG
Antibody Screen: NEGATIVE

## 2021-11-06 SURGERY — HYSTERECTOMY, TOTAL, LAPAROSCOPIC, WITH SALPINGECTOMY
Anesthesia: General | Site: Vagina

## 2021-11-06 MED ORDER — KETOROLAC TROMETHAMINE 30 MG/ML IJ SOLN
INTRAMUSCULAR | Status: AC
  Filled 2021-11-06: qty 1

## 2021-11-06 MED ORDER — FENTANYL CITRATE (PF) 250 MCG/5ML IJ SOLN
INTRAMUSCULAR | Status: AC
  Filled 2021-11-06: qty 5

## 2021-11-06 MED ORDER — SUGAMMADEX SODIUM 200 MG/2ML IV SOLN
INTRAVENOUS | Status: DC | PRN
  Administered 2021-11-06: 200 mg via INTRAVENOUS

## 2021-11-06 MED ORDER — KETOROLAC TROMETHAMINE 30 MG/ML IJ SOLN
INTRAMUSCULAR | Status: DC | PRN
  Administered 2021-11-06: 30 mg via INTRAVENOUS

## 2021-11-06 MED ORDER — SODIUM CHLORIDE 0.9 % IR SOLN
Status: DC | PRN
  Administered 2021-11-06 (×2): 1000 mL

## 2021-11-06 MED ORDER — 0.9 % SODIUM CHLORIDE (POUR BTL) OPTIME
TOPICAL | Status: DC | PRN
  Administered 2021-11-06: 500 mL

## 2021-11-06 MED ORDER — PROMETHAZINE HCL 25 MG/ML IJ SOLN: 6.2500 mg | INTRAMUSCULAR | Status: DC | PRN

## 2021-11-06 MED ORDER — DEXAMETHASONE SODIUM PHOSPHATE 10 MG/ML IJ SOLN
INTRAMUSCULAR | Status: AC
  Filled 2021-11-06: qty 1

## 2021-11-06 MED ORDER — LIDOCAINE HCL (PF) 2 % IJ SOLN
INTRAMUSCULAR | Status: AC
  Filled 2021-11-06: qty 5

## 2021-11-06 MED ORDER — OXYCODONE-ACETAMINOPHEN 5-325 MG PO TABS
ORAL_TABLET | ORAL | Status: AC
  Filled 2021-11-06: qty 2

## 2021-11-06 MED ORDER — SURGIFLO WITH THROMBIN (HEMOSTATIC MATRIX KIT) OPTIME
TOPICAL | Status: DC | PRN
Start: 2021-11-06 — End: 2021-11-06
  Administered 2021-11-06: 1

## 2021-11-06 MED ORDER — HYDROMORPHONE HCL 2 MG/ML IJ SOLN
INTRAMUSCULAR | Status: AC
  Filled 2021-11-06: qty 1

## 2021-11-06 MED ORDER — SCOPOLAMINE 1 MG/3DAYS TD PT72
1.0000 | MEDICATED_PATCH | TRANSDERMAL | Status: DC
  Administered 2021-11-06: 1.5 mg via TRANSDERMAL

## 2021-11-06 MED ORDER — CIPROFLOXACIN IN D5W 400 MG/200ML IV SOLN
INTRAVENOUS | Status: AC
  Filled 2021-11-06: qty 200

## 2021-11-06 MED ORDER — ROSUVASTATIN CALCIUM 10 MG PO TABS
10.0000 mg | ORAL_TABLET | Freq: Every day | ORAL | Status: DC
  Administered 2021-11-06: 10 mg via ORAL
  Filled 2021-11-06: qty 1

## 2021-11-06 MED ORDER — GABAPENTIN 300 MG PO CAPS
ORAL_CAPSULE | ORAL | Status: AC
  Filled 2021-11-06: qty 1

## 2021-11-06 MED ORDER — KETOROLAC TROMETHAMINE 30 MG/ML IJ SOLN
30.0000 mg | Freq: Four times a day (QID) | INTRAMUSCULAR | Status: DC
  Administered 2021-11-06 – 2021-11-07 (×3): 30 mg via INTRAVENOUS

## 2021-11-06 MED ORDER — ACETAMINOPHEN 500 MG PO TABS
ORAL_TABLET | ORAL | Status: AC
  Filled 2021-11-06: qty 2

## 2021-11-06 MED ORDER — MEPERIDINE HCL 25 MG/ML IJ SOLN: 6.2500 mg | INTRAMUSCULAR | Status: DC | PRN

## 2021-11-06 MED ORDER — PROPOFOL 10 MG/ML IV BOLUS
INTRAVENOUS | Status: DC | PRN
  Administered 2021-11-06: 20 mg via INTRAVENOUS
  Administered 2021-11-06: 140 mg via INTRAVENOUS

## 2021-11-06 MED ORDER — BUPIVACAINE HCL (PF) 0.25 % IJ SOLN
INTRAMUSCULAR | Status: DC | PRN
Start: 2021-11-06 — End: 2021-11-06
  Administered 2021-11-06: 7 mL

## 2021-11-06 MED ORDER — GABAPENTIN 300 MG PO CAPS
300.0000 mg | ORAL_CAPSULE | ORAL | Status: AC
  Administered 2021-11-06: 300 mg via ORAL

## 2021-11-06 MED ORDER — MIDAZOLAM HCL 2 MG/2ML IJ SOLN
INTRAMUSCULAR | Status: DC | PRN
  Administered 2021-11-06: 2 mg via INTRAVENOUS

## 2021-11-06 MED ORDER — LACTATED RINGERS IV SOLN: INTRAVENOUS | Status: DC

## 2021-11-06 MED ORDER — METRONIDAZOLE 500 MG/100ML IV SOLN
INTRAVENOUS | Status: AC
  Filled 2021-11-06: qty 100

## 2021-11-06 MED ORDER — IBUPROFEN 800 MG PO TABS: 800.0000 mg | ORAL_TABLET | Freq: Three times a day (TID) | ORAL | Status: DC | PRN

## 2021-11-06 MED ORDER — MENTHOL 3 MG MT LOZG: 1.0000 | LOZENGE | OROMUCOSAL | Status: DC | PRN

## 2021-11-06 MED ORDER — PROPOFOL 500 MG/50ML IV EMUL
INTRAVENOUS | Status: AC
  Filled 2021-11-06: qty 50

## 2021-11-06 MED ORDER — MORPHINE SULFATE (PF) 4 MG/ML IV SOLN: 1.0000 mg | INTRAVENOUS | Status: DC | PRN

## 2021-11-06 MED ORDER — SODIUM CHLORIDE 0.9 % IV SOLN
INTRAVENOUS | Status: DC | PRN
  Administered 2021-11-06: 60 mL

## 2021-11-06 MED ORDER — CIPROFLOXACIN IN D5W 400 MG/200ML IV SOLN
400.0000 mg | INTRAVENOUS | Status: AC
  Administered 2021-11-06: 400 mg via INTRAVENOUS

## 2021-11-06 MED ORDER — ENOXAPARIN SODIUM 40 MG/0.4ML IJ SOSY
40.0000 mg | PREFILLED_SYRINGE | INTRAMUSCULAR | Status: AC
  Administered 2021-11-06: 40 mg via SUBCUTANEOUS

## 2021-11-06 MED ORDER — ACETAMINOPHEN 500 MG PO TABS
1000.0000 mg | ORAL_TABLET | ORAL | Status: AC
  Administered 2021-11-06: 1000 mg via ORAL

## 2021-11-06 MED ORDER — PANTOPRAZOLE SODIUM 40 MG PO TBEC: 40.0000 mg | DELAYED_RELEASE_TABLET | Freq: Every day | ORAL | Status: DC

## 2021-11-06 MED ORDER — OXYCODONE HCL 5 MG/5ML PO SOLN: 5.0000 mg | Freq: Once | ORAL | Status: DC | PRN

## 2021-11-06 MED ORDER — ONDANSETRON HCL 4 MG/2ML IJ SOLN
INTRAMUSCULAR | Status: AC
  Filled 2021-11-06: qty 2

## 2021-11-06 MED ORDER — HYDROMORPHONE HCL 1 MG/ML IJ SOLN: 0.2500 mg | INTRAMUSCULAR | Status: DC | PRN

## 2021-11-06 MED ORDER — BUPROPION HCL ER (SR) 150 MG PO TB12
150.0000 mg | ORAL_TABLET | Freq: Every day | ORAL | Status: DC
  Administered 2021-11-06: 150 mg via ORAL
  Filled 2021-11-06: qty 1

## 2021-11-06 MED ORDER — POVIDONE-IODINE 10 % EX SWAB: 2.0000 "application " | Freq: Once | CUTANEOUS | Status: DC

## 2021-11-06 MED ORDER — HYDROMORPHONE HCL 1 MG/ML IJ SOLN
INTRAMUSCULAR | Status: DC | PRN
Start: 2021-11-06 — End: 2021-11-06
  Administered 2021-11-06: .5 mg via INTRAVENOUS

## 2021-11-06 MED ORDER — ONDANSETRON HCL 4 MG/2ML IJ SOLN: 4.0000 mg | Freq: Four times a day (QID) | INTRAMUSCULAR | Status: DC | PRN

## 2021-11-06 MED ORDER — SCOPOLAMINE 1 MG/3DAYS TD PT72
MEDICATED_PATCH | TRANSDERMAL | Status: AC
  Filled 2021-11-06: qty 1

## 2021-11-06 MED ORDER — LIDOCAINE 2% (20 MG/ML) 5 ML SYRINGE
INTRAMUSCULAR | Status: DC | PRN
  Administered 2021-11-06: 80 mg via INTRAVENOUS

## 2021-11-06 MED ORDER — VALACYCLOVIR HCL 500 MG PO TABS
500.0000 mg | ORAL_TABLET | Freq: Every day | ORAL | Status: DC
  Filled 2021-11-06: qty 1

## 2021-11-06 MED ORDER — ONDANSETRON HCL 4 MG PO TABS: 4.0000 mg | ORAL_TABLET | Freq: Four times a day (QID) | ORAL | Status: DC | PRN

## 2021-11-06 MED ORDER — LIDOCAINE-EPINEPHRINE 1 %-1:100000 IJ SOLN
INTRAMUSCULAR | Status: DC | PRN
  Administered 2021-11-06: 13 mL

## 2021-11-06 MED ORDER — DEXAMETHASONE SODIUM PHOSPHATE 10 MG/ML IJ SOLN
INTRAMUSCULAR | Status: DC | PRN
Start: 2021-11-06 — End: 2021-11-06
  Administered 2021-11-06: 10 mg via INTRAVENOUS

## 2021-11-06 MED ORDER — FENTANYL CITRATE (PF) 100 MCG/2ML IJ SOLN
INTRAMUSCULAR | Status: DC | PRN
Start: 2021-11-06 — End: 2021-11-06
  Administered 2021-11-06: 50 ug via INTRAVENOUS
  Administered 2021-11-06: 25 ug via INTRAVENOUS
  Administered 2021-11-06: 50 ug via INTRAVENOUS
  Administered 2021-11-06: 25 ug via INTRAVENOUS
  Administered 2021-11-06: 100 ug via INTRAVENOUS

## 2021-11-06 MED ORDER — ENOXAPARIN SODIUM 40 MG/0.4ML IJ SOSY
PREFILLED_SYRINGE | INTRAMUSCULAR | Status: AC
  Filled 2021-11-06: qty 0.4

## 2021-11-06 MED ORDER — ONDANSETRON HCL 4 MG/2ML IJ SOLN
INTRAMUSCULAR | Status: DC | PRN
  Administered 2021-11-06: 4 mg via INTRAVENOUS

## 2021-11-06 MED ORDER — ESCITALOPRAM OXALATE 20 MG PO TABS
20.0000 mg | ORAL_TABLET | Freq: Every day | ORAL | Status: DC
  Filled 2021-11-06: qty 1

## 2021-11-06 MED ORDER — ACETAMINOPHEN 500 MG PO TABS: 1000.0000 mg | ORAL_TABLET | Freq: Once | ORAL | Status: DC

## 2021-11-06 MED ORDER — OXYCODONE HCL 5 MG PO TABS: 5.0000 mg | ORAL_TABLET | Freq: Once | ORAL | Status: DC | PRN

## 2021-11-06 MED ORDER — ALBUMIN HUMAN 5 % IV SOLN
INTRAVENOUS | Status: AC
  Filled 2021-11-06: qty 250

## 2021-11-06 MED ORDER — EPHEDRINE 5 MG/ML INJ
INTRAVENOUS | Status: AC
  Filled 2021-11-06: qty 5

## 2021-11-06 MED ORDER — METRONIDAZOLE 500 MG/100ML IV SOLN
500.0000 mg | INTRAVENOUS | Status: AC
  Administered 2021-11-06: 500 mg via INTRAVENOUS

## 2021-11-06 MED ORDER — MIDAZOLAM HCL 2 MG/2ML IJ SOLN
INTRAMUSCULAR | Status: AC
  Filled 2021-11-06: qty 2

## 2021-11-06 MED ORDER — ROCURONIUM BROMIDE 10 MG/ML (PF) SYRINGE
PREFILLED_SYRINGE | INTRAVENOUS | Status: DC | PRN
  Administered 2021-11-06: 70 mg via INTRAVENOUS
  Administered 2021-11-06: 20 mg via INTRAVENOUS

## 2021-11-06 MED ORDER — ROCURONIUM BROMIDE 10 MG/ML (PF) SYRINGE
PREFILLED_SYRINGE | INTRAVENOUS | Status: AC
  Filled 2021-11-06: qty 10

## 2021-11-06 MED ORDER — OXYCODONE-ACETAMINOPHEN 5-325 MG PO TABS
1.0000 | ORAL_TABLET | ORAL | Status: DC | PRN
  Administered 2021-11-06 – 2021-11-07 (×2): 2 via ORAL

## 2021-11-06 SURGICAL SUPPLY — 70 items
ADH SKN CLS APL DERMABOND .7 (GAUZE/BANDAGES/DRESSINGS) ×8
AGENT HMST KT MTR STRL THRMB (HEMOSTASIS) ×4
BAG DECANTER FOR FLEXI CONT (MISCELLANEOUS) ×2 IMPLANT
BLADE SURG 11 STRL SS (BLADE) ×5 IMPLANT
BLADE SURG 15 STRL LF DISP TIS (BLADE) ×4 IMPLANT
BLADE SURG 15 STRL SS (BLADE) ×5
CATH FOLEY 2WAY SLVR  5CC 18FR (CATHETERS) ×5
CATH FOLEY 2WAY SLVR 5CC 18FR (CATHETERS) ×4 IMPLANT
COVER MAYO STAND STRL (DRAPES) ×5 IMPLANT
DERMABOND ADVANCED (GAUZE/BANDAGES/DRESSINGS) ×2
DERMABOND ADVANCED .7 DNX12 (GAUZE/BANDAGES/DRESSINGS) ×2 IMPLANT
DURAPREP 26ML APPLICATOR (WOUND CARE) ×5 IMPLANT
GAUZE 4X4 16PLY ~~LOC~~+RFID DBL (SPONGE) ×12 IMPLANT
GAUZE PACKING 2X5 YD STRL (GAUZE/BANDAGES/DRESSINGS) ×2 IMPLANT
GLOVE SURG ENC MOIS LTX SZ6.5 (GLOVE) ×16 IMPLANT
GLOVE SURG POLYISO LF SZ6.5 (GLOVE) ×4 IMPLANT
GLOVE SURG UNDER POLY LF SZ6.5 (GLOVE) ×4 IMPLANT
GLOVE SURG UNDER POLY LF SZ7 (GLOVE) ×11 IMPLANT
GOWN STRL REUS W/TWL LRG LVL3 (GOWN DISPOSABLE) ×26 IMPLANT
HOLDER FOLEY CATH W/STRAP (MISCELLANEOUS) ×5 IMPLANT
IV NS 1000ML (IV SOLUTION) ×10
IV NS 1000ML BAXH (IV SOLUTION) ×2 IMPLANT
KIT TURNOVER CYSTO (KITS) ×5 IMPLANT
LIGASURE BLUNT TIP 5 LONG 44CM (ELECTROSURGICAL) ×2 IMPLANT
NEEDLE HYPO 22GX1.5 SAFETY (NEEDLE) ×5 IMPLANT
NEEDLE INSUFFLATION 120MM (ENDOMECHANICALS) ×5 IMPLANT
NS IRRIG 1000ML POUR BTL (IV SOLUTION) ×5 IMPLANT
NS IRRIG 500ML POUR BTL (IV SOLUTION) ×2 IMPLANT
OCCLUDER COLPOPNEUMO (BALLOONS) ×5 IMPLANT
PACK LAPAROSCOPY BASIN (CUSTOM PROCEDURE TRAY) ×5 IMPLANT
PACK TRENDGUARD 450 HYBRID PRO (MISCELLANEOUS) ×1 IMPLANT
PACK VAGINAL WOMENS (CUSTOM PROCEDURE TRAY) ×5 IMPLANT
PLUG CATH AND CAP STER (CATHETERS) ×5 IMPLANT
POUCH LAPAROSCOPIC INSTRUMENT (MISCELLANEOUS) ×5 IMPLANT
PROTECTOR NERVE ULNAR (MISCELLANEOUS) ×10 IMPLANT
SET IRRIG Y TYPE TUR BLADDER L (SET/KITS/TRAYS/PACK) ×5 IMPLANT
SET SUCTION IRRIG HYDROSURG (IRRIGATION / IRRIGATOR) ×5 IMPLANT
SET TRI-LUMEN FLTR TB AIRSEAL (TUBING) ×5 IMPLANT
SHEARS HARMONIC ACE PLUS 36CM (ENDOMECHANICALS) ×2 IMPLANT
SHEET LAVH (DRAPES) ×5 IMPLANT
SLEEVE ADV FIXATION 5X100MM (TROCAR) ×5 IMPLANT
SLING TVT EXACT (Sling) ×2 IMPLANT
SPONGE SURGIFOAM ABS GEL 12-7 (HEMOSTASIS) IMPLANT
SURGIFLO W/THROMBIN 8M KIT (HEMOSTASIS) ×2 IMPLANT
SUT CAPIO ETHIBPND (SUTURE) IMPLANT
SUT VIC AB 0 CT1 18XCR BRD8 (SUTURE) IMPLANT
SUT VIC AB 0 CT1 27 (SUTURE) ×20
SUT VIC AB 0 CT1 27XBRD ANBCTR (SUTURE) ×10 IMPLANT
SUT VIC AB 0 CT1 8-18 (SUTURE)
SUT VIC AB 2-0 CT1 27 (SUTURE)
SUT VIC AB 2-0 CT1 TAPERPNT 27 (SUTURE) IMPLANT
SUT VIC AB 2-0 CT2 27 (SUTURE) ×2 IMPLANT
SUT VIC AB 2-0 SH 27 (SUTURE) ×20
SUT VIC AB 2-0 SH 27XBRD (SUTURE) ×10 IMPLANT
SUT VIC AB 2-0 UR6 27 (SUTURE) IMPLANT
SUT VIC AB 4-0 PS2 18 (SUTURE) ×9 IMPLANT
SUT VICRYL 0 UR6 27IN ABS (SUTURE) IMPLANT
SYR 10ML LL (SYRINGE) ×5 IMPLANT
SYR 50ML LL SCALE MARK (SYRINGE) ×10 IMPLANT
SYR BULB EAR ULCER 3OZ GRN STR (SYRINGE) ×5 IMPLANT
TIP UTERINE 6.7X8CM BLUE DISP (MISCELLANEOUS) ×2 IMPLANT
TOWEL OR 17X26 10 PK STRL BLUE (TOWEL DISPOSABLE) ×8 IMPLANT
TRAY FOLEY W/BAG SLVR 14FR LF (SET/KITS/TRAYS/PACK) ×5 IMPLANT
TRENDGUARD 450 HYBRID PRO PACK (MISCELLANEOUS) ×5
TROCAR ADV FIXATION 5X100MM (TROCAR) ×5 IMPLANT
TROCAR BLADELESS OPT 5 100 (ENDOMECHANICALS) ×5 IMPLANT
TROCAR PORT AIRSEAL 5X120 (TROCAR) ×5 IMPLANT
TROCAR XCEL NON BLADE 8MM B8LT (ENDOMECHANICALS) ×2 IMPLANT
TUBE CONNECTING 12X1/4 (SUCTIONS) ×5 IMPLANT
WARMER LAPAROSCOPE (MISCELLANEOUS) ×5 IMPLANT

## 2021-11-06 NOTE — Anesthesia Procedure Notes (Signed)
Procedure Name: Intubation Date/Time: 11/06/2021 7:40 AM Performed by: Genelle Bal, CRNA Pre-anesthesia Checklist: Patient identified, Emergency Drugs available, Suction available and Patient being monitored Patient Re-evaluated:Patient Re-evaluated prior to induction Oxygen Delivery Method: Circle system utilized Preoxygenation: Pre-oxygenation with 100% oxygen Induction Type: IV induction Ventilation: Mask ventilation without difficulty Laryngoscope Size: Miller and 2 Grade View: Grade I Tube type: Oral Tube size: 7.0 mm Number of attempts: 1 Airway Equipment and Method: Stylet and Oral airway Placement Confirmation: ETT inserted through vocal cords under direct vision, positive ETCO2 and breath sounds checked- equal and bilateral Secured at: 22 cm Tube secured with: Tape Dental Injury: Teeth and Oropharynx as per pre-operative assessment

## 2021-11-06 NOTE — Progress Notes (Signed)
Day of Surgery Procedure(s) (LRB): TOTAL LAPAROSCOPIC HYSTERECTOMY WITH BILATERAL SALPINGOOOPHORECTOMY. (Bilateral) CYSTOSCOPY (N/A) ANTERIOR AND POSTERIOR COLPORRHAPHY (N/A) TRANSVAGINAL TAPE EXACT MIDURETHRAL (N/A)  Subjective: Patient reports tolerating PO.   Ambulated in halls.  Good pain control.  Feels a little bladder discomfort.  No narcotic use per RN.   Brought her CPAP machine from home.   Husband is present at bedside.  Objective: I have reviewed patient's vital signs and intake and output. Vitals:   11/06/21 1450 11/06/21 1610  BP: 97/62 97/61  Pulse: 70 87  Resp: 20   Temp:  97.9 F (36.6 C)  SpO2: 92% 94%   O2 sat 94% on 2L.   I/O - 3470cc/2420 cc  Gen:  sleepy but appropriate conversation.  Lungs:  crackles in bilateral bases.  Cor:  [id number].  Abd:  absent bowel sounds, soft, nontender.  Abdominal incisions:  clean, dry, intact.  SP incisions pressure dressing.   Vag pad:  minimal bleeding.  Ext:  DPs 2+ bilaterally.   Assessment: s/p Procedure(s): TOTAL LAPAROSCOPIC HYSTERECTOMY WITH BILATERAL SALPINGOOOPHORECTOMY. (Bilateral) CYSTOSCOPY (N/A) ANTERIOR AND POSTERIOR COLPORRHAPHY (N/A) TRANSVAGINAL TAPE EXACT MIDURETHRAL (N/A): progressing well  Plan: Incentive spirometry.  Will use CPAP tonight.  Regular diet.    Percocet, Toradol for pain.  Foley and vaginal packing overnight.   Will start bladder trials in the am.  CBC and BMP in the am.  Surgical findings and procedure discussed with the patient.  Questions answered.   LOS: 0 days    Arloa Koh 11/06/2021, 6:50 PM

## 2021-11-06 NOTE — H&P (Signed)
Clinical Support 10/16/2021 [facility], MD Obstetrics and Gynecology Stress incontinence +3 more Dx Procedure ; Referred by Derinda Late, MD Reason for Visit   Additional Documentation  Vitals:  BP 100/64 Pulse 60 Ht 5\' 3"  (1.6 m) Wt 84.4 kg LMP 07/08/2020 (Approximate) SpO2 98% BMI 32.95 kg/m BSA 1.94 m  Flowsheets:  NEWS, MEWS Score, Anthropometrics, Method of Visit   Encounter Info:  Billing Info, History, Allergies, Detailed Report    All Notes    Progress Notes by Nunzio Cobbs, MD at 10/16/2021 11:30 AM  Author: Nunzio Cobbs, MD Author Type: Physician Filed: 10/17/2021 12:45 PM  Note Status: Signed Cosign: Cosign Not Required Encounter Date: 10/16/2021  Editor: Nunzio Cobbs, MD (Physician)      Prior Versions: 1. Lowella Fairy, CMA (Certified Psychologist, sport and exercise) at 10/16/2021 11:36 AM - Sign when Signing Visit    GYNECOLOGY  VISIT   HPI: 61 y.o.   Married  Caucasian  female   G0P0000 with Patient's last menstrual period was 07/08/2020 (approximate).   here for simple urodynamics and pre-op visit.    Has urinary stress incontinence, cystocele, rectocele, simple endometrial hyperplasia and fibroids.  She is interested in surgery for all of the above.  She presented with postmenopausal bleeding that lead to her endometrial biopsy showing the simple hyperplasia.  She has known fibroids from her current and prior ultrasounds.  Her largest fibroid is 5.85 cm and her uterine size is 9.84 x 10.24 x 10.98 cm. Her ovaries are normal.     She leaks urine with cough, sneeze, and exercise.  Jumping on trampoline starting 20 years ago caused leakage of urine for her.  Voids about every 2 - 3 hours.  She feels like she does not empty well.  She does splint to have bowel movements. She has done some training with pelvic floor therapist.    Hx 2 vaginal deliveries.    Prior to the visit  today, she had 1 cup coffee today, 1/4 can caffeine soda, 40 ounces water.   GYNECOLOGIC HISTORY: Patient's last menstrual period was 07/08/2020 (approximate). Contraception:  Tubal Menopausal hormone therapy:  none Last mammogram:  08-21-21 category c density birads 1:neg Last pap smear:  06-15-21 neg HPV HV neg, 03-28-16 neg HPV HR neg, 02-04-14 neg HPV HR neg               OB History       Gravida  0   Para  0   Term  0   Preterm      AB  0   Living           SAB      IAB      Ectopic  0   Multiple      Live Births                        Patient Active Problem List    Diagnosis Date Noted   At risk for depression 03/15/2021   At risk for deficient intake of food 12/19/2020   Prediabetes 12/19/2020   At risk for malnutrition 11/09/2020   SOBOE (shortness of breath on exertion) 10/13/2020   Fatigue 10/13/2020   Other hyperlipidemia 10/13/2020   OSA on CPAP 10/13/2020   Vitamin D deficiency 10/13/2020   Gastroesophageal reflux disease 10/13/2020   Mood disorder ([place]), with emotional eating 10/13/2020  At risk for heart disease 10/13/2020   Ingrown toenail 05/17/2020   Plantar fasciitis 05/17/2020   Social anxiety disorder            Past Medical History:  Diagnosis Date   Acid reflux     Anxiety     Back pain     Bladder prolapse, female, acquired     C. difficile colitis     Chondromalacia     COVID 08/12/2021   Depression     Dysplasia of cervix, low grade (CIN 1) 1991   Fibroid     Fibroids      uterine   Heartburn     High cholesterol     History of COVID-19 06/05/2021   HSV-1 infection 2010   Lumbar spondylosis     Mild depression     Palpitations     Plantar fasciitis, bilateral     Simple endometrial hyperplasia without atypia 08/2021   Sleep apnea     SOBOE (shortness of breath on exertion)     Social anxiety disorder 1998   Swallowing difficulty     Swelling of both lower extremities     Vitamin D deficiency              Past Surgical History:  Procedure Laterality Date   APPENDECTOMY       AUGMENTATION MAMMAPLASTY       BREAST ENHANCEMENT SURGERY   10/08/2006    [facility]low Mares)   TUBAL LIGATION                Current Outpatient Medications  Medication Sig Dispense Refill   buPROPion (WELLBUTRIN SR) 150 MG 12 hr tablet Pt to increase dose to BID 30 tablet 0   Celecoxib (CELEBREX PO) Take by mouth daily.       escitalopram (LEXAPRO) 20 MG tablet Take 20 mg by mouth daily.       neomycin-polymyxin-hydrocortisone (CORTISPORIN) 3.5-10000-1 OTIC suspension 4 drops 3 (three) times daily.       omeprazole (PRILOSEC) 20 MG capsule Take 20 mg by mouth as needed.       rosuvastatin (CRESTOR) 10 MG tablet SMARTSIG:1 Tablet(s) By Mouth Every Evening       triamcinolone cream (KENALOG) 0.1 % APPLY TO RASH ON HANDS TWICE DAILY AS NEEDED   2   valACYclovir (VALTREX) 500 MG tablet Take one tablet (500 mg) by mouth daily.  Take one tablet (500 mg) by mouth twice daily for an outbreak. 100 tablet 3   Vitamin D, Ergocalciferol, (DRISDOL) 1.25 MG (50000 UNIT) CAPS capsule Take 1 capsule (50,000 Units total) by mouth every 7 (seven) days. 4 capsule 0    No current facility-administered medications for this visit.      ALLERGIES: Cephalosporins        Family History  Problem Relation Age of Onset   Cancer Father          lung   Thyroid disease Father     Cancer Brother          testicular cancer   Osteoarthritis Mother     Migraines Mother     Thyroid disease Mother     Depression Mother     Anxiety disorder Mother     Obesity Mother     Osteoarthritis Maternal Grandmother     Hypertension Paternal Grandmother     Stroke Paternal Grandmother        Social History         Socioeconomic  History   Marital status: Married      Spouse name: R Tomi Bamberger   Number of children: Not on file   Years of education: Not on file   Highest education level: Not on file  Occupational History   Not on file  Tobacco  Use   Smoking status: Former      Years: 10.00      Types: Cigarettes      Quit date: 2012      Years since quitting: 11.0   Smokeless tobacco: Never  Vaping Use   Vaping Use: Never used  Substance and Sexual Activity   Alcohol use: Not Currently      Comment: 1-2 glasses of wine per month   Drug use: No   Sexual activity: Yes      Partners: Male      Birth control/protection: Surgical, Post-menopausal      Comment: BTL  Other Topics Concern   Not on file  Social History Narrative   Not on file    Social Determinants of Health    Financial Resource Strain: Not on file  Food Insecurity: Not on file  Transportation Needs: Not on file  Physical Activity: Not on file  Stress: Not on file  Social Connections: Not on file  Intimate Partner Violence: Not on file      Review of Systems  All other systems reviewed and are negative.   PHYSICAL EXAMINATION:     BP 100/64    Pulse 60    Ht 5\' 3"  (1.6 m)    Wt 186 lb (84.4 kg)    LMP 07/08/2020 (Approximate)    SpO2 98%    BMI 32.95 kg/m     General appearance: alert, cooperative and appears stated age Head: Normocephalic, without obvious abnormality, atraumatic Neck: no adenopathy, supple, symmetrical, trachea midline and thyroid normal to inspection and palpation Lungs: clear to auscultation bilaterally Breasts: normal appearance, no masses or tenderness, No nipple retraction or dimpling, No nipple discharge or bleeding, No axillary or supraclavicular adenopathy Heart: regular rate and rhythm Abdomen: soft, non-tender, no masses,  no organomegaly Extremities: extremities normal, atraumatic, no cyanosis or edema Skin: Skin color, texture, turgor normal. No rashes or lesions Lymph nodes: Cervical, supraclavicular, and axillary nodes normal. No abnormal inguinal nodes palpated Neurologic: Grossly normal   Pelvic: External genitalia:  no lesions              Urethra:  normal appearing urethra with no masses, tenderness or  lesions              Bartholins and Skenes: normal                 Vagina: normal appearing vagina with normal color and discharge, no lesions.  Second degree cystocele, first degree rectocele.              Cervix: no lesions                Bimanual Exam:  Uterus:  10 week size and nontender.              Adnexa: no mass, fullness, tenderness              Rectal exam: yes.  Confirms.              Anus:  normal sphincter tone, no lesions   Chaperone was present for exam:  Estill Bamberg, CMA   Simple urodynamic testing Voided 300 cc.  Sterile prep  of urethra with betadine.  12 French catheter placed.  PVR 250 cc of clear, yellow urine noted.  Bladder filling with sterile water:   150 cc - 1st sensation 250 cc - 2nd sensation 335 cc - 3rd sensation No signs of detrusor instability.  Valsalva produced leakage of urine consistent with stress incontinence.    ASSESSMENT   Fibroids - 10 week size.  Simple endometrial hyperplasia without atypia.  Cystocele.  Stress incontinence.  Incomplete bladder emptying, which I think is most likely due to her prolapse.  Rectocele.  Uses CPAP.   PLAN   Urodynamic testing reviewed.  We discussed midurethral sling versus Freada Bergeron plication versus no urethral surgery at the time of her laparoscopic hysterectomy/BSO/anterior and posterior colporrhaphy with cystoscopy.  We have discussed risks and benefits of urethral surgery for urinary incontinence and I did high light the permanent nature of the sling and mesh complications and risk of urinary retention with any anti-incontinence procedures which may result in need for catheterization, self catheterization, and reoperation to loosen or cut the sling.     Patient will proceed with total laparoscopic hysterectomy with bilateral salpingo-oophorectomy, vaginal morcellation of uterus in Alexis bag, anterior and posterior colporrhaphy, TVT Exact Midurethral sling, and cystoscopy.  Risks, benefits, and  alternatives have been reviewed.    Recovery expectations also discussed.    She will bring her CPAP to the hospital with her.   An After Visit Summary was printed and given to the patient.   30 min  total time was spent for this patient encounter, including preparation, face-to-face counseling with the patient, coordination of care, and documentation of the encounter.

## 2021-11-06 NOTE — Op Note (Signed)
OPERATIVE REPORT   PREOPERATIVE DIAGNOSIS:   Simple endometrial hyperplasia, uterine fibroids, genuine stress incontinence, cystocele, rectocele.   POSTOPERATIVE DIAGNOSIS:   Simple endometrial hyperplasia, uterine fibroids, right adnexal adhesion, genuine stress incontinence, cystocele, rectocele.  PROCEDURES:  Total laparoscopic hysterectomy with bilateral salpingo-oophorectomy, lysis of adhesion of right adnexa, TVT Exact midurethral sling, cystoscopy, anterior and posterior colporrhaphy.  SURGEON:   Lenard Galloway, M.D.   ASSISTANT:   Dorothy Spark, M.D.     ANESTHESIA:  General endotracheal, intraperitoneal ropivicaine 30 mL diluted in 30 mL of normal saline, local with 0.25% Marcaine.   IVF:  1500 cc LR    ESTIMATED BLOOD LOSS:   100 cc   URINE OUTPUT:   735 cc   COMPLICATIONS:  None.   INDICATIONS FOR THE PROCEDURE:      The patient is a 61 year old P2 Caucasian female, status post bilateral tubal ligation, who presents with postmenopausal bleeding and endometrial biopsy showing simple endometrial hyperplasia without atypia.  Pelvic ultrasound demonstrated uterine fibroids and normal ovaries.  The patient also has genuine stress continence confirmed by simple office cystometrics.  She has a second degree cystocele causing difficulty with voiding, and she has a first degree rectocele causing her to splint to have bowel movements.  The patient desires hysterectomy and pelvic reconstruction surgery along with an anti-incontinence procedure.    A plan is made to proceed with a total laparoscopic hysterectomy with bilateral salpingo-oophorectomy, TVT Exact midurethral sing, cystoscopy and anterior and posterior colporrhaphy after risks, benefits, and alternatives were reviewed.   FINDINGS:      Exam under anesthesia revealed a 10 week size uterus.  No adnexal masses are noted.    Laparoscopy revealed an enlarged uterus with two posterior subserosal fibroids measuring approximately  4 - 5 cm each.  The tubes were consistent with prior tubal ligation.  The bilateral ovaries were normal.  The right adnexa was adherent to the right pelvic side wall with one thin adhesion, which was lysed.  There was an small area of potential old blue lesions of endometriosis of the left pelvic side wall.  This was not treated as the patient is postmenopausal.  The upper abdomen demonstrated a normal liver.  There were adhesions of the cecum to the right pelvic side wall consistent with prior appendectomy.    Cystoscopy following the hysterectomy and with placement of the midurethral sling showed the bladder to be normal throughout 360 degrees including the bladder dome and trigone. There was no evidence of any foreign body in the bladder or the urethra. There was no evidence of any lesions of the bladder or the urethra.  Both of the ureters were noted to be patent bilaterally.   SPECIMENS:      The uterus, cervix, and bilateral tubes were went to pathology.    DESCRIPTION OF PROCEDURE:     The patient was reidentified in the preoperative hold area.   She did receive Ciprofloxacin and Flagyl IV for antibiotic prophylaxis.  She received Lovenox, TED hose, and PAS stockings for DVT prophylaxis.   In the operating room, the patient was placed in the dorsal lithotomy position on the operating room table.   Her legs were placed in the [place] stirrups and her arms were both tucked at her sides. The Trendguard was used to support her shoulders.  The patient received general endotracheal anesthesia. The abdomen and vagina were then sterilely prepped and she was sterilely draped.   A speculum was placed  in the vagina and a single-tooth tenaculum was placed on the anterior cervical lip.  A figure-of-eight suture of 0 Vicryl was placed on each the anterior and the posterior cervical lips. The uterus was sounded to 8 cm. The cervix was then dilated with [facility] dilators.  A #8 RUMI tip was placed with a KOH ring  through the cervix and into the uterine cavity without difficulty.  The remaining vaginal instruments were then removed.      A Foley catheter was placed inside the bladder and left to gravity drainage.    Attention was turned to the abdomen where the umbilical region was injected with 0.25% Marcaine and 5 mm incision created.  Penetrating towel clips were used to raise the abdominal wall.  A Veress needle was then used to insufflate the abdomen with CO2 gas after a saline drop test was performed and the fluid flowed freely.  A 5 mm camera port was then placed using the Optiview. The towel clips were removed.  An 8 mm incision was created in the left mid abdomen and a 5 mm incision was placed left lower abdomen after the skin was injected locally with 0.25% Marcaine.  Respective trocars were then placed under visualization of the laparoscope.  An 8 mm trocar was placed in the right lower quadrant after injecting with Marcaine and incising with a scalpel.  The [employer] was used to maintain the pneumoperitoneum.   Ropivicine 30 cc diluted in 30 cc of normal saline was placed inside the peritoneal cavity.     The patient was placed in Trendelenburg position.  An inspection of the abdomen and pelvis was performed. The findings are as noted above.    The bilateral ureters were identified.  The left infundibulopelvic ligament was grasped and the Ligasure was used to cauterize and cut through the pedicle.  The dissection continued through the broad ligament. The left round ligament was then cauterized and divided with the Ligasure.  Dissection was performed to the anterior and posterior leaves of the broad ligaments using the Harmonic scalpel.     The incision was carried across the anterior cul- de-sac along the vesicouterine fold and the bladder was dissected away from the cervix. There appeared to be adhesions between the bladder and the cervix.  The peritoneum was taken down posteriorly.     Attention was  turned to the patient's right-hand side at this time.  The same procedure that was performed on the left side was repeated on the right side with respect to the isolation, cautery, and transection of the ligaments, vessels, and the vesicouterine dissection.   The right uterine artery was skeletonized with the harmonic scalpel and then cauterized and cut with the Ligasure instrument.   The left uterine artery was then skeletonized using the Harmonic scalpel.  It was cauterized and cut with the Ligasure instrument.   The KOH ring was nicely visible.  The colpotomy incision was performed with the Harmonic scalpel in a circumferential fashion. The uterus with cervix and bilateral tubes and ovaries were then removed intact, vaginally from the peritoneal cavity.  The specimen was sent to Pathology.  The balloon occluder was placed in the vagina.      The pelvis was irrigated and suctioned.  The pneumoperitoneal was decreased.  There was some minor bleeding along the left side of the bladder where the bladder was dissected away from the cervix.  The Ligasure was used to create hemostasis.  There was then good hemostasis  of the operative sites and al pedicles.      The CO2 pneumoperitoneum was released.  The patient received manual breaths to remove any remaining CO2 gas and all trocars were removed.  All trocar sites were closed with subcuticular sutures of 4/0 Vicryl.  Dermabond was placed over all of the incisions.   The vaginal occluder balloon was removed from the vagina.    Allis clamps were used to mark the anterior vaginal wall from 1 cm below the urethra to the vaginal apex.  The anterior vaginal wall mucosa was injected locally with 1% lidocaine with epinephrine, 1:100,000.  The vaginal mucosa was then incised vertically in the midline with the Metzenbaum scissors.  With a combination of sharp and blunt dissection, the subvaginal tissue was dissected off the bladder bilaterally.  The dissection  was carried back to the pubic rami anteriorly.    The TVT Exact midurethral sling was performed.  The 1 cm suprapubic incisions were created with a scalpel to the right and left of the midline.  The TVT Exact was performed in a bottom-up fashion.  The Foley catheter was removed and the Foley tip with the obturator guide was placed inside the urethra and deflected properly.  The guide was placed through the right retropubic space and then up through the right suprapubic incision.  This was performed without difficulty.  The urethra was deflected in opposite direction and the same was then performed on the patient's left-hand side.  The obturator guide was removed and cystoscopy was performed and the findings were as noted above.  All cystoscopic fluid was drained and the Foley catheter was replaced. The sling was brought up through the suprapubic incisions bilaterally. A Kelly clamp was placed between the sling and the urethra, and the plastic sheaths were removed.  The sling was trimmed suprapubically. The sling was noted to be in good position.  The anterior colporrhaphy was performed with vertical mattress sutures of 2/0 Vicryl for reduction of the cystocele.   Surgiflo was then placed over the anterior colporrhaphy repair and up near the sites where the midurethral sling went into the retropubic space.   Hemostasis was good.  The anterior vaginal wall mucosa was trimmed and then the anterior vaginal wall was closed with a running locked suture of 2-0 Vicryl.  The vaginal cuff was closed with a running locked suture of 0/0 Vicryl.   The posterior colporrhaphy was performed at this time.  Allis clamps were used to mark the perineal body and then the posterior vaginal mucosa up to the top of the rectocele.  The perineal body and mucosa were injected locally with 1% lidocaine with epinephrine, 1:100,000.  A triangular wedge of epithelium was excised from the perineal body and the  posterior mucosa was incised vertically in the midline with a Metzenbaum scissors.  Sharp and blunt dissection were used to dissect the perirectal fascia off the vaginal mucosa bilaterally.  The rectocele was reduced.  A 2-0 Vicryl pursestring suture was placed at the very top of the rectocele repair. Vertical mattress sutures of 2/0 Vicryl were then used.  The rectocele reduced nicely. There were some bleeding veins over the right side of the colporrhaphy sutures.  Surgiflow was also placed over this area.  Excess vaginal  mucosa was trimmed and the posterior vaginal wall was closed with a running  lock suture of 2-0 Vicryl down to the hymen.  This suture was then brought behind the hymen. The 2-0 Vicryl was used to  then place running sutures along the  transverse superficial perineal muscles.  This suture was then brought up the  perineum in a subcuticular fashion and the knot was tied at the hymen as for  an episiotomy-type repair.  Rectal exam was performed and there was no evidence of any suture or foreign body in the rectum.  There was good support and elevation to the Anterior, apical, and posterior vaginal walls.  A gauze packing with Estrace cream was placed inside the vagina.  The suprapubic incisions were closed with subcuticular sutures of 4/0 Vicryl and then Dermabond.  The patient was awakened and extubated, and escorted to the recovery room in stable condition.  There were no complications.  All needle, instrument, and sponge counts were correct.   An MD assistant was necessary for the procedure for positioning, instrumentation,  and visualization of surgical field due to the complexity of the case.   Lenard Galloway, M.D.

## 2021-11-06 NOTE — Progress Notes (Signed)
Pt's O2 sat level 94% on 2L [place] while lying in bed. Pt ambulated in hall on RA with assistance, denies SOB, RN noted mild DOE during walk. Upon returning to room, O2 sats 89-90% RA. Pt continues to deny SOB. O2 [place] 2L reapplied, O2 sats 93-94%. Other VSS. Will continue to monitor.

## 2021-11-06 NOTE — Transfer of Care (Signed)
Immediate Anesthesia Transfer of Care Note  [name]  Procedure(s) Performed: TOTAL LAPAROSCOPIC HYSTERECTOMY WITH BILATERAL SALPINGOOOPHORECTOMY. (Bilateral: Abdomen) CYSTOSCOPY (Urethra) ANTERIOR AND POSTERIOR COLPORRHAPHY (Vagina ) TRANSVAGINAL TAPE EXACT MIDURETHRAL (Vagina )  Patient Location: PACU  Anesthesia Type:General  Level of Consciousness: awake, alert  and oriented  Airway & Oxygen Therapy: Patient Spontanous Breathing and Patient connected to face mask oxygen  Post-op Assessment: Report given to RN and Post -op Vital signs reviewed and stable  Post vital signs: Reviewed and stable  Last Vitals:  Vitals Value Taken Time  BP 125/69 11/06/21 1152  Temp    Pulse 100 11/06/21 1155  Resp 21 11/06/21 1155  SpO2 94 % 11/06/21 1155  Vitals shown include unvalidated device data.  Last Pain:  Vitals:   11/06/21 0625  TempSrc: Oral  PainSc: 4       Patients Stated Pain Goal: 2 (23/30/07 6226)  Complications: No notable events documented.

## 2021-11-06 NOTE — Progress Notes (Signed)
Update to History and Physical  No marked change in status since office pre-op visit.   Patient examined.   OK to proceed with surgery. 

## 2021-11-07 ENCOUNTER — Encounter (HOSPITAL_BASED_OUTPATIENT_CLINIC_OR_DEPARTMENT_OTHER): Payer: Self-pay | Admitting: Obstetrics and Gynecology

## 2021-11-07 DIAGNOSIS — D259 Leiomyoma of uterus, unspecified: Secondary | ICD-10-CM | POA: Diagnosis not present

## 2021-11-07 LAB — BASIC METABOLIC PANEL
Anion gap: 5 (ref 5–15)
BUN: 13 mg/dL (ref 6–20)
CO2: 24 mmol/L (ref 22–32)
Calcium: 8.4 mg/dL — ABNORMAL LOW (ref 8.9–10.3)
Chloride: 108 mmol/L (ref 98–111)
Creatinine, Ser: 0.68 mg/dL (ref 0.44–1.00)
GFR, Estimated: >60 mL/min (ref >60)
Glucose, Bld: 129 mg/dL — ABNORMAL HIGH (ref 70–99)
Potassium: 4 mmol/L (ref 3.5–5.1)
Sodium: 137 mmol/L (ref 135–145)

## 2021-11-07 LAB — CBC
HCT: 34.4 % — ABNORMAL LOW (ref 36.0–46.0)
Hemoglobin: 11.3 g/dL — ABNORMAL LOW (ref 12.0–15.0)
MCH: 28.9 pg (ref 26.0–34.0)
MCHC: 32.8 g/dL (ref 30.0–36.0)
MCV: 88 fL (ref 80.0–100.0)
Platelets: 234 10*3/uL (ref 150–400)
RBC: 3.91 MIL/uL (ref 3.87–5.11)
RDW: 12.6 % (ref 11.5–15.5)
WBC: 14.2 10*3/uL — ABNORMAL HIGH (ref 4.0–10.5)
nRBC: 0 % (ref 0.0–0.2)

## 2021-11-07 LAB — SURGICAL PATHOLOGY

## 2021-11-07 MED ORDER — OXYCODONE-ACETAMINOPHEN 5-325 MG PO TABS: 1.0000 | ORAL_TABLET | ORAL | 0 refills | Status: DC | PRN

## 2021-11-07 MED ORDER — OXYCODONE-ACETAMINOPHEN 5-325 MG PO TABS
ORAL_TABLET | ORAL | Status: AC
  Filled 2021-11-07: qty 2

## 2021-11-07 MED ORDER — KETOROLAC TROMETHAMINE 30 MG/ML IJ SOLN
INTRAMUSCULAR | Status: AC
  Filled 2021-11-07: qty 1

## 2021-11-07 MED ORDER — IBUPROFEN 800 MG PO TABS: 800.0000 mg | ORAL_TABLET | Freq: Three times a day (TID) | ORAL | 0 refills | Status: DC | PRN

## 2021-11-07 NOTE — Discharge Instructions (Addendum)
Hi Adora,   Your surgery went well yesterday!  Please call the office if you need anything.   Josefa Half, MD

## 2021-11-07 NOTE — Progress Notes (Signed)
1 Day Post-Op Procedure(s) (LRB): TOTAL LAPAROSCOPIC HYSTERECTOMY WITH BILATERAL SALPINGOOOPHORECTOMY. (Bilateral) CYSTOSCOPY (N/A) ANTERIOR AND POSTERIOR COLPORRHAPHY (N/A) TRANSVAGINAL TAPE EXACT MIDURETHRAL (N/A)  Subjective: Patient reports tolerating PO and + flatus.   Percocet working well to control pain.  States bloody sputum last hs and this am.  Denies shortness of breath.  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 She has sleep apnea and used her CPAP with O2 last hs.   Foley catheter and vaginal packing out by RN.  No void yet.  Objective: I have reviewed patient's vital signs, intake and output, and labs. Vitals:   11/07/21 0224 11/07/21 0530  BP: (!) 101/57 (!) 91/52  Pulse: 74 71  Resp: 16 20  Temp: 97.7 F (36.5 C) 97.6 F (36.4 C)  SpO2: 97% 98%   O2 sat on RA - 87%, then 93%.  I/O - 4595 cc/3020 cc CBC    Component Value Date/Time   WBC 14.2 (H) 11/07/2021 0213   RBC 3.91 11/07/2021 0213   HGB 11.3 (L) 11/07/2021 0213   HGB 14.7 10/13/2020 1056   HCT 34.4 (L) 11/07/2021 0213   HCT 43.5 10/13/2020 1056   PLT 234 11/07/2021 0213   PLT 308 10/13/2020 1056   MCV 88.0 11/07/2021 0213   MCV 86 10/13/2020 1056   MCH 28.9 11/07/2021 0213   MCHC 32.8 11/07/2021 0213   RDW 12.6 11/07/2021 0213   RDW 12.2 10/13/2020 1056   LYMPHSABS 1.8 10/13/2020 1056   EOSABS 0.1 10/13/2020 1056   BASOSABS 0.0 10/13/2020 1056   Glucose 129, calcium 8.4.  General: alert and cooperative Resp: rales bibasilar Cardio: regular rate and rhythm, S1, S2 normal, no murmur, click, rub or gallop GI: soft, non-tender; bowel sounds normal; no masses,  no organomegaly and incision: clean, dry, intact, and SP incisions with minor ecchymoses. Vaginal Bleeding: minimal  Assessment: s/p Procedure(s): TOTAL LAPAROSCOPIC HYSTERECTOMY WITH BILATERAL SALPINGOOOPHORECTOMY. (Bilateral) CYSTOSCOPY  (N/A) ANTERIOR AND POSTERIOR COLPORRHAPHY (N/A) TRANSVAGINAL TAPE EXACT MIDURETHRAL (N/A): progressing well and intermittent decreased O2 sats Sleep apnea.   Plan: Voiding trials.   Anesthesia has just been in to see patient and approve her for discharge home.  No chest x-ray recommended.  Rx for Percocet and Motrin.  Discharge instructions reviewed.  Follow up in one week.   LOS: 0 days    Arloa Koh 11/07/2021, 7:51 AM

## 2021-11-07 NOTE — Anesthesia Postprocedure Evaluation (Signed)
Anesthesia Post Note  [name]  Procedure(s) Performed: TOTAL LAPAROSCOPIC HYSTERECTOMY WITH BILATERAL SALPINGOOOPHORECTOMY. (Bilateral: Abdomen) CYSTOSCOPY (Urethra) ANTERIOR AND POSTERIOR COLPORRHAPHY (Vagina ) TRANSVAGINAL TAPE EXACT MIDURETHRAL (Vagina )     Patient location during evaluation: PACU Anesthesia Type: General Level of consciousness: sedated and patient cooperative Pain management: pain level controlled Vital Signs Assessment: post-procedure vital signs reviewed and stable Respiratory status: spontaneous breathing Cardiovascular status: stable Anesthetic complications: no   No notable events documented.  Last Vitals:  Vitals:   11/07/21 0224 11/07/21 0530  BP: (!) 101/57 (!) 91/52  Pulse: 74 71  Resp: 16 20  Temp: 36.5 C 36.4 C  SpO2: 97% 98%    Last Pain:  Vitals:   11/07/21 0530  TempSrc:   PainSc: 0-No pain                 Nolon Nations

## 2021-11-07 NOTE — Progress Notes (Signed)
Pt voided 100 ml urine, bladder scan revealed > 999 ml urine retention; MD notified, order to place foley catheter and D/C pt home. Also notified MD of pt's continued slight DOE, O2 sat 94% on RA. Pt denies chest pain. Fine crackles to bilateral lung bases. Encouraged ISP, ambulation, TCDB. Other VSS.

## 2021-11-13 ENCOUNTER — Ambulatory Visit (INDEPENDENT_AMBULATORY_CARE_PROVIDER_SITE_OTHER): Payer: 59 | Admitting: Obstetrics and Gynecology

## 2021-11-13 ENCOUNTER — Encounter: Payer: Self-pay | Admitting: Obstetrics and Gynecology

## 2021-11-13 ENCOUNTER — Other Ambulatory Visit: Payer: Self-pay

## 2021-11-13 VITALS — BP 110/66 | HR 92 | Ht 63.0 in | Wt 186.0 lb

## 2021-11-13 DIAGNOSIS — Z9889 Other specified postprocedural states: Secondary | ICD-10-CM

## 2021-11-13 NOTE — Progress Notes (Signed)
GYNECOLOGY  VISIT   HPI: 61 y.o.   Married  Caucasian  female   [id number] with Patient's last menstrual period was 07/08/2020 (approximate).   here for 1 week status post TOTAL LAPAROSCOPIC HYSTERECTOMY WITH BILATERAL SALPINGOOOPHORECTOMY. (Bilateral: Abdomen) CYSTOSCOPY (Urethra)  ANTERIOR AND POSTERIOR COLPORRHAPHY (Vagina TRANSVAGINAL TAPE EXACT MIDURETHRAL (Vagina ). Final pathology:  microscopic focus of simple endometrial hyperplasia, fibroids, benign cervix, right ovarian serous cystadenoma, left ovary with infarcted , parasitic appendices epiploica, benign tubes.   Patient states she has coughed up bloody mucus several times in past several days. She has had this since her surgery was done.   Here for foley catheter removal.  She had a post void residual at the hospital post op day one of about 1000 cc, which developed quickly after her catheter was removed.   Last use of Percocet was 5 days ago.  She is using Ibuprofen for pain control.  GYNECOLOGIC HISTORY: Patient's last menstrual period was 07/08/2020 (approximate). Contraception:  Tubal/Hyst Menopausal hormone therapy:  none Last mammogram:  08-21-21 Neg/Birads1 Last pap smear: 06-15-21 Neg:Neg HR HPV, 03-28-16 Neg:Neg HR HPV, 02-04-14 Neg:Neg HR HPV        OB History     Gravida  0   Para  0   Term  0   Preterm      AB  0   Living         SAB      IAB      Ectopic  0   Multiple      Live Births                 Patient Active Problem List   Diagnosis Date Noted   Status post laparoscopic hysterectomy 11/06/2021   At risk for depression 03/15/2021   At risk for deficient intake of food 12/19/2020   Prediabetes 12/19/2020   At risk for malnutrition 11/09/2020   SOBOE (shortness of breath on exertion) 10/13/2020   Fatigue 10/13/2020   Other hyperlipidemia 10/13/2020   OSA on CPAP 10/13/2020   Vitamin D deficiency 10/13/2020   Gastroesophageal reflux disease 10/13/2020   Mood disorder ([place]),  with emotional eating 10/13/2020   At risk for heart disease 10/13/2020   Ingrown toenail 05/17/2020   Plantar fasciitis 05/17/2020   Social anxiety disorder     Past Medical History:  Diagnosis Date   Acid reflux    Anxiety    Back pain    Bladder prolapse, female, acquired    C. 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 Jeanella Cara. Patient uses cpap nightly per pt on 10/26/21.   SOBOE (shortness of breath on exertion) 10/13/2020   per MD note from Dr[facility],  SOBOE likely related to obesity & exericise induced.   Social anxiety disorder 1998   Swallowing difficulty  feeling of fullness in upper chest, resolved as of 10/26/2021 per pt   Swelling of both lower extremities    hx of left ankle swelling and knee arthritis   Vitamin D deficiency    Wears contact lenses     Past Surgical History:  Procedure Laterality Date   ANTERIOR (CYSTOCELE) AND POSTERIOR REPAIR (RECTOCELE) WITH XENFORM GRAFT AND SACROSPINOUS FIXATION N/A 11/06/2021   Procedure: ANTERIOR AND POSTERIOR COLPORRHAPHY;  Surgeon: Nunzio Cobbs, MD;  Location: [facility];  Service: Gynecology;  Laterality: N/A;   APPENDECTOMY  1972   BLADDER SUSPENSION N/A 11/06/2021   Procedure: TRANSVAGINAL TAPE EXACT MIDURETHRAL;  Surgeon: Nunzio Cobbs, MD;  Location: [facility];  Service: Gynecology;  Laterality: N/A;   BREAST ENHANCEMENT SURGERY   10/08/2006   [facility] Harlow Mares)   COLONOSCOPY     approximately 2014, had previous colonoscopy 5 years ealier   CYSTOSCOPY N/A 11/06/2021   Procedure: CYSTOSCOPY;  Surgeon: Nunzio Cobbs, MD;  Location: [facility];  Service: Gynecology;  Laterality: N/A;   DG BARIUM SWALLOW ([place] [facility])     around 2022   TOTAL LAPAROSCOPIC HYSTERECTOMY WITH SALPINGECTOMY Bilateral 11/06/2021   Procedure: TOTAL LAPAROSCOPIC HYSTERECTOMY WITH BILATERAL SALPINGOOOPHORECTOMY.;  Surgeon: Nunzio Cobbs, MD;  Location: [facility];  Service: Gynecology;  Laterality: Bilateral;   TUBAL LIGATION  01/1995    Current Outpatient Medications  Medication Sig Dispense Refill   buPROPion (WELLBUTRIN SR) 150 MG 12 hr tablet Pt to increase dose to BID (Patient taking differently: 150 mg daily. Pt to increase dose to BID, has not started as of 10/26/21.) 30 tablet 0   escitalopram (LEXAPRO) 20 MG tablet Take 20 mg by mouth daily.     ibuprofen (ADVIL) 800 MG tablet Take 1 tablet (800 mg total) by mouth every 8 (eight) hours as needed for mild pain. 30 tablet 0   neomycin-polymyxin-hydrocortisone (CORTISPORIN) 3.5-10000-1 OTIC suspension 4 drops daily as needed.     omeprazole (PRILOSEC) 20 MG capsule Take 20 mg by mouth as needed.     oxyCODONE-acetaminophen (PERCOCET/ROXICET) 5-325 MG tablet Take 1-2 tablets by mouth every 4 (four) hours as needed for moderate pain. 30 tablet 0   rosuvastatin (CRESTOR) 10 MG tablet SMARTSIG:1 Tablet(s) By Mouth Every Evening     triamcinolone cream (KENALOG) 0.1 % daily as needed.  2   valACYclovir (VALTREX) 500 MG tablet Take one tablet (500 mg) by mouth daily.  Take one tablet (500 mg) by mouth twice daily for an outbreak. (Patient taking differently: Take one tablet (500 mg) by mouth daily.  Take one tablet (500 mg) by mouth twice daily for an outbreak. Patient only takes as needed per pt on 10/26/21.) 100 tablet 3   Vitamin D, Ergocalciferol,  (DRISDOL) 1.25 MG (50000 UNIT) CAPS capsule Take 1 capsule (50,000 Units total) by mouth every 7 (seven) days. 4 capsule 0   No current facility-administered medications for this visit.     ALLERGIES: Cephalosporins  Family History  Problem Relation Age of Onset   Cancer Father        lung   Thyroid disease Father    Cancer Brother        testicular cancer   Osteoarthritis Mother    Migraines Mother    Thyroid disease Mother    Depression Mother    Anxiety disorder Mother    Obesity Mother    Osteoarthritis Maternal  Grandmother    Hypertension Paternal Grandmother    Stroke Paternal Grandmother     Social History   Socioeconomic History   Marital status: Married    Spouse name: R Tomi Bamberger   Number of children: Not on file   Years of education: Not on file   Highest education level: Not on file  Occupational History   Not on file  Tobacco Use   Smoking status: Former    Years: 10.00    Types: Cigarettes    Quit date: 2012    Years since quitting: 11.1   Smokeless tobacco: Never  Vaping Use   Vaping Use: Never used  Substance and Sexual Activity   Alcohol use: Yes    Comment: 1-3 glasses of wine per month   Drug use: No   Sexual activity: Yes    Partners: Male    Birth control/protection: Surgical, Post-menopausal    Comment: BTL  Other Topics Concern   Not on file  Social History Narrative   Not on file   Social Determinants of Health   Financial Resource Strain: Not on file  Food Insecurity: Not on file  Transportation Needs: Not on file  Physical Activity: Not on file  Stress: Not on file  Social Connections: Not on file  Intimate Partner Violence: Not on file    Review of Systems  Respiratory:         Patient states has coughed up some bloody mucus several times  All other systems reviewed and are negative.  PHYSICAL EXAMINATION:    BP 110/66    Pulse 92    Ht 5\' 3"  (1.6 m)    Wt 186 lb (84.4 kg)    LMP 07/08/2020 (Approximate)    SpO2 97%     BMI 32.95 kg/m     General appearance: alert, cooperative and appears stated age Lungs:  CTA bilaterally.  Cor:  S1S2 RRR.  Abdomen: incisions intact.  Abdomen is soft, non-tender, no masses,  no organomegaly   Pelvic: External genitalia:  SP incisions intact.                Urethra:  normal appearing urethra with no masses, tenderness or lesions                            Bimanual Exam:  Uterus:  absent.  Suture lines intact.                Adnexa: no mass, fullness, tenderness   PVR check Bladder filled with 250 cc sterile water.  Patient voided 300 cc.   Chaperone was present for exam:  Estill Bamberg, CMA  ASSESSMENT  Status post total laparoscopic hysterectomy with bilateral salpingo-oophorectomy, anterior and posterior colporrhaphy, TVT Exact midurethral sling, and cystoscopy.  Adequate voiding.  Bloody oral mucous   PLAN  Pathology report reviewed with patient.  She will call if she it not having adequate voiding.  FU for next post op visit in 6 weeks, sooner as needed. I recommend she contact her PCP for further evaluation of her oral bloody mucous.    An After Visit Summary was printed and given to the patient.

## 2021-11-15 ENCOUNTER — Other Ambulatory Visit: Payer: Self-pay | Admitting: Family Medicine

## 2021-11-15 ENCOUNTER — Ambulatory Visit
Admission: RE | Admit: 2021-11-15 | Discharge: 2021-11-15 | Disposition: A | Discharge status: Home / Self Care | Payer: 59 | Source: Ambulatory Visit | Attending: Family Medicine | Admitting: Family Medicine

## 2021-11-15 DIAGNOSIS — R042 Hemoptysis: Secondary | ICD-10-CM

## 2021-11-19 NOTE — Discharge Summary (Signed)
Physician Discharge Summary  Patient ID: Lisa Wade MRN: [id number] DOB/AGE: 07-Dec-1960 61 y.o.  Admit date: 11/06/2021 Discharge date:11/07/21  Admission Diagnoses: Simple endometrial hyperplasia Uterine fibroids Genuine stress incontinence Cystocele  Rectocele Sleep apnea  Discharge Diagnoses:  Simple endometrial hyperplasia Uterine fibroids Genuine stress incontinence Cystocele  Rectocele Sleep apnea Status post total laparoscopic hysterectomy with bilateral salpingo-oophorectomy, lysis of adhesion of right adnexa, TVT Exact midurethral sling, cystoscopy, anterior and posterior colporrhaphy Urinary retention post op.  Principal Problem:   Status post laparoscopic hysterectomy   Discharged Condition: good  Hospital Course:  The patient was admitted on  11/06/21 for a  total laparoscopic hysterectomy with bilateral salpingo-oophorectomy, lysis of adhesion of right adnexa, TVT Exact midurethral sling, cystoscopy, anterior and posterior colporrhaphy which were performed without complication while under general anesthesia. She had good pain control with Toradol and Percocet and tolerated a regular diet.  She ambulated independently and wore PAS and Ted hose for DVT prophylaxis while in bed.  She used her CPAP machine from home and received oxygen supplementation through this on the night of post op day one due to O2 saturation levels in the high 80%s.  Her foley catheter and vaginal packing were removed on post op day one, and she was noted to have a post void residual of about 1000 cc within a few hours of the catheter being removed.  The foley was replaced and left to gravity drainage. The patient's vital signs remained stable and she demonstrated no signs of infection during her hospitalization.  Her post op day one Hgb was 11.3.  She was tolerating the this well.  She had very minimal vaginal bleeding, and her incisions demonstrated no signs of erythema or significant drainage.   The suprapubic incisions showed signs of ecchymoses.  She had bloody sputum post op, and this was attributed to her ET tube. She showed no signs of respiratory distress. Incentive spirometry was encouraged.  She was found to be in good condition and ready for discharge on post op day  one.  Consults: Anesthesia consultation was performed on post op day one due to Os saturation ranging from 87% to 98% on room air and due to bloody sputum.  Her O2 saturation was attributed to her sleep apnea and the bloody sputum was attributed to the ET tube.  No chest x-ray was recommended by anesthesia.  Significant Diagnostic Studies: labs:  See Hospital Course  Treatments: surgery:   Total laparoscopic hysterectomy with bilateral salpingo-oophorectomy, lysis of adhesion of right adnexa, TVT Exact midurethral sling, cystoscopy, anterior and posterior colporrhaphy  Discharge Exam: Blood pressure (!) 92/54, pulse 68, temperature 98.3 F (36.8 C), resp. rate 19, height 5\' 4"  (1.626 m), weight 86.2 kg, last menstrual period 07/08/2020, SpO2 94 %. General: alert and cooperative Resp: rales bibasilar Cardio: regular rate and rhythm, S1, S2 normal, no murmur, click, rub or gallop GI: soft, non-tender; bowel sounds normal; no masses,  no organomegaly and incision: clean, dry, intact, and SP incisions with minor ecchymoses. Vaginal Bleeding: minimal  Disposition: Discharge disposition: 01-Home or Self Care     Post op instructions were reviewed in verbal and written form.   Discharge Instructions     Discharge patient   Complete by: As directed    Call MD prior to patient discharge if post void residuals are over 150 cc.   Discharge disposition: 01-Home or Self Care   Discharge patient date: 11/07/2021      Allergies as of 11/07/2021  Reactions   Cephalosporins Diarrhea   Caused C. Diff per pt        Medication List     STOP taking these medications    CELEBREX PO       TAKE these  medications    buPROPion 150 MG 12 hr tablet Commonly known as: Wellbutrin SR Pt to increase dose to BID What changed:  how much to take when to take this additional instructions   escitalopram 20 MG tablet Commonly known as: LEXAPRO Take 20 mg by mouth daily.   ibuprofen 800 MG tablet Commonly known as: ADVIL Take 1 tablet (800 mg total) by mouth every 8 (eight) hours as needed for mild pain.   neomycin-polymyxin-hydrocortisone 3.5-10000-1 OTIC suspension Commonly known as: CORTISPORIN 4 drops daily as needed.   omeprazole 20 MG capsule Commonly known as: PRILOSEC Take 20 mg by mouth as needed.   oxyCODONE-acetaminophen 5-325 MG tablet Commonly known as: PERCOCET/ROXICET Take 1-2 tablets by mouth every 4 (four) hours as needed for moderate pain.   rosuvastatin 10 MG tablet Commonly known as: CRESTOR SMARTSIG:1 Tablet(s) By Mouth Every Evening   triamcinolone cream 0.1 % Commonly known as: KENALOG daily as needed.   valACYclovir 500 MG tablet Commonly known as: VALTREX Take one tablet (500 mg) by mouth daily.  Take one tablet (500 mg) by mouth twice daily for an outbreak. What changed: additional instructions   Vitamin D (Ergocalciferol) 1.25 MG (50000 UNIT) Caps capsule Commonly known as: DRISDOL Take 1 capsule (50,000 Units total) by mouth every 7 (seven) days.        Follow-up Information     Nunzio Cobbs, MD Follow up in 1 week(s).   Specialty: Obstetrics and Gynecology Contact information: [address] [place] [address] [phone]                 Signed: Arloa Koh 11/19/2021, 10:55 AM

## 2021-11-27 ENCOUNTER — Other Ambulatory Visit: Payer: Self-pay | Admitting: Family Medicine

## 2021-11-27 DIAGNOSIS — R042 Hemoptysis: Secondary | ICD-10-CM

## 2021-11-27 DIAGNOSIS — R0600 Dyspnea, unspecified: Secondary | ICD-10-CM

## 2021-12-04 ENCOUNTER — Encounter (INDEPENDENT_AMBULATORY_CARE_PROVIDER_SITE_OTHER): Payer: Self-pay | Admitting: Family Medicine

## 2021-12-04 ENCOUNTER — Other Ambulatory Visit: Payer: Self-pay

## 2021-12-04 ENCOUNTER — Ambulatory Visit (INDEPENDENT_AMBULATORY_CARE_PROVIDER_SITE_OTHER): Payer: 59 | Admitting: Family Medicine

## 2021-12-04 VITALS — BP 106/69 | HR 90 | Temp 97.6°F | Ht 63.0 in | Wt 189.0 lb

## 2021-12-04 DIAGNOSIS — R7303 Prediabetes: Secondary | ICD-10-CM

## 2021-12-04 DIAGNOSIS — F39 Unspecified mood [affective] disorder: Secondary | ICD-10-CM | POA: Diagnosis not present

## 2021-12-04 DIAGNOSIS — Z9189 Other specified personal risk factors, not elsewhere classified: Secondary | ICD-10-CM

## 2021-12-04 DIAGNOSIS — Z6831 Body mass index (BMI) 31.0-31.9, adult: Secondary | ICD-10-CM

## 2021-12-04 DIAGNOSIS — E559 Vitamin D deficiency, unspecified: Secondary | ICD-10-CM

## 2021-12-04 DIAGNOSIS — Z6833 Body mass index (BMI) 33.0-33.9, adult: Secondary | ICD-10-CM

## 2021-12-04 DIAGNOSIS — E669 Obesity, unspecified: Secondary | ICD-10-CM

## 2021-12-04 DIAGNOSIS — Z20822 Contact with and (suspected) exposure to covid-19: Secondary | ICD-10-CM

## 2021-12-04 MED ORDER — VITAMIN D (ERGOCALCIFEROL) 1.25 MG (50000 UNIT) PO CAPS: 50000.0000 [IU] | ORAL_CAPSULE | ORAL | 0 refills | Status: DC

## 2021-12-04 MED ORDER — BUPROPION HCL 75 MG PO TABS: 75.0000 mg | ORAL_TABLET | Freq: Two times a day (BID) | ORAL | 0 refills | Status: DC

## 2021-12-05 NOTE — Progress Notes (Signed)
Chief Complaint:   OBESITY Lisa Wade is here to discuss her progress with her obesity treatment plan along with follow-up of her obesity related diagnoses. Lisa Wade is on the Category 2 Plan and keeping a food journal and adhering to recommended goals of 1500-1600 calories and 100 grams protein and states she is following her eating plan approximately 0% of the time. Lisa Wade states she is not currently exercising.  Today's visit was #: 52 Starting weight: 185 lbs Starting date: 10/13/2020 Today's weight: 189 lbs Today's date: 12/04/2021 Total lbs lost to date: 0 Total lbs lost since last in-office visit: +9  Interim History: Pt had a complete hysterectomy January 30th. She did well but is still out of work. She is not having much and other's brought sweets and foods that weren't the healthiest. Pt did not bring in labs from PCP.  Subjective:   1. Prediabetes Pt reports carb cravings. Her A1c was 5.6 on 10/04/2021.  2. Vitamin D deficiency She is currently taking prescription vitamin D 50,000 IU each week. She denies nausea, vomiting or muscle weakness.  3. Mood disorder, with emotional eating Pt stopped Wellbutrin on her own after recent surgery. She feels no different emotionally, but has had increased cravings.  4. Contact with and (suspected) exposure to COVID-19 Family member are in the hospital with COVID. They eat out usually 2-3 dinner a week and 2 lunches.  5. At risk for malnutrition Lisa Wade is at increased risk for malnutrition due to recent habits with diet.  Assessment/Plan:   Orders Placed This Encounter  Procedures   Hemoglobin A1c   Insulin, random   VITAMIN D 25 Hydroxy (Vit-D Deficiency, Fractures)    Medications Discontinued During This Encounter  Medication Reason   buPROPion (WELLBUTRIN SR) 150 MG 12 hr tablet    Vitamin D, Ergocalciferol, (DRISDOL) 1.25 MG (50000 UNIT) CAPS capsule Reorder     Meds ordered this encounter  Medications   Vitamin D,  Ergocalciferol, (DRISDOL) 1.25 MG (50000 UNIT) CAPS capsule    Sig: Take 1 capsule (50,000 Units total) by mouth every 7 (seven) days.    Dispense:  4 capsule    Refill:  0    Ov for rf   buPROPion (WELLBUTRIN) 75 MG tablet    Sig: Take 1 tablet (75 mg total) by mouth 2 (two) times daily.    Dispense:  60 tablet    Refill:  0    30 d supply;  ** OV for RF **   Do not send RF request     1. Prediabetes Pt declines Metformin after risks and benefits discussed, and pt will get back on meal plan as line of treatment. Repeat labs at next OV.  - Hemoglobin A1c - Insulin, random  2. Vitamin D deficiency Low Vitamin D level contributes to fatigue and are associated with obesity, breast, and colon cancer. She agrees to continue to take prescription Vitamin D 50,000 IU every week and will follow-up for routine testing of Vitamin D, at least 2-3 times per year to avoid over-replacement. Repeat labs at next OV.  Refill- Vitamin D, Ergocalciferol, (DRISDOL) 1.25 MG (50000 UNIT) CAPS capsule; Take 1 capsule (50,000 Units total) by mouth every 7 (seven) days.  Dispense: 4 capsule; Refill: 0  - VITAMIN D 25 Hydroxy (Vit-D Deficiency, Fractures)  3. Mood disorder, with emotional eating Behavior modification techniques were discussed today to help Lisa Wade deal with her emotional/non-hunger eating behaviors.  Orders and follow up as documented in patient  record. Pt desires to get back on Wellbutrin- low dose.  Restart- buPROPion (WELLBUTRIN) 75 MG tablet; Take 1 tablet (75 mg total) by mouth 2 (two) times daily.  Dispense: 60 tablet; Refill: 0  4. Contact with and (suspected) exposure to COVID-19 Pt will decrease her exposure risk by wearing masks in public/work. Eat out much less.  5. At risk for malnutrition Lisa Wade was given extensive malnutrition prevention education and counseling today of more than 9 minutes. Counseled her that malnutrition refers to inappropriate nutrients or not the right balance of  nutrients for optimal health. Discussed with Lisa Wade that it is absolutely possible to be malnourished but yet obese. Risk factors, including but not limited to, inappropriate dietary choices, difficulty with obtaining food due to physical or financial limitations, and various physical and mental health conditions were reviewed with Lisa Wade.   6. Obesity with current BMI of 33.6 Lisa Wade is currently in the action stage of change. As such, her goal is to continue with weight loss efforts. She has agreed to the Category 2 Plan and keeping a food journal and adhering to recommended goals of 1500-1600 calories and 100 grams protein.   Recipes given and meal prep discussed with pt.  Exercise goals:  As is  Behavioral modification strategies: increasing lean protein intake, decreasing simple carbohydrates, decreasing eating out, and avoiding temptations.  Lisa Wade has agreed to follow-up with our clinic in 3-4 weeks, fasting. She was informed of the importance of frequent follow-up visits to maximize her success with intensive lifestyle modifications for her multiple health conditions.   Objective:   Blood pressure 106/69, pulse 90, temperature 97.6 F (36.4 C), height 5\' 3"  (1.6 m), weight 189 lb (85.7 kg), last menstrual period 07/08/2020, SpO2 91 %. Body mass index is 33.48 kg/m.  General: Cooperative, alert, well developed, in no acute distress. HEENT: Conjunctivae and lids unremarkable. Cardiovascular: Regular rhythm.  Lungs: Normal work of breathing. Neurologic: No focal deficits.   Lab Results  Component Value Date   CREATININE 0.68 11/07/2021   BUN 13 11/07/2021   NA 137 11/07/2021   K 4.0 11/07/2021   CL 108 11/07/2021   CO2 24 11/07/2021   Lab Results  Component Value Date   ALT 23 02/06/2021   AST 19 02/06/2021   ALKPHOS 66 02/06/2021   BILITOT 0.6 02/06/2021   Lab Results  Component Value Date   HGBA1C 5.6 10/04/2021   HGBA1C 5.7 (H) 02/06/2021   HGBA1C  5.8 (H) 10/13/2020   Lab Results  Component Value Date   INSULIN 11.0 02/06/2021   INSULIN 11.4 10/13/2020   Lab Results  Component Value Date   TSH 1.070 10/13/2020   Lab Results  Component Value Date   CHOL 177 02/06/2021   HDL 61 02/06/2021   LDLCALC 95 02/06/2021   TRIG 120 02/06/2021   CHOLHDL 2.9 02/06/2021   Lab Results  Component Value Date   VD25OH 37.1 02/06/2021   VD25OH 13.0 (L) 10/13/2020   Lab Results  Component Value Date   WBC 14.2 (H) 11/07/2021   HGB 11.3 (L) 11/07/2021   HCT 34.4 (L) 11/07/2021   MCV 88.0 11/07/2021   PLT 234 11/07/2021   Attestation Statements:   Reviewed by clinician on day of visit: allergies, medications, problem list, medical history, surgical history, family history, social history, and previous encounter notes.  Coral Ceo, CMA, am acting as transcriptionist for [employer], DO.  I have reviewed the above documentation for accuracy and  completeness, and I agree with the above. Marjory Sneddon, D.O.  The [place] was signed into law in 2016 which includes the topic of electronic health records.  This provides immediate access to information in MyChart.  This includes consultation notes, operative notes, office notes, lab results and pathology reports.  If you have any questions about what you read please let us know at your next visit so we can discuss your concerns and take corrective action if need be.  We are right here with you.

## 2021-12-11 ENCOUNTER — Ambulatory Visit
Admission: RE | Admit: 2021-12-11 | Discharge: 2021-12-11 | Disposition: A | Discharge status: Home / Self Care | Payer: 59 | Source: Ambulatory Visit | Attending: Family Medicine | Admitting: Family Medicine

## 2021-12-11 DIAGNOSIS — R0600 Dyspnea, unspecified: Secondary | ICD-10-CM

## 2021-12-11 DIAGNOSIS — R042 Hemoptysis: Secondary | ICD-10-CM

## 2021-12-11 MED ORDER — DIPHENHYDRAMINE HCL 25 MG PO CAPS
25.0000 mg | ORAL_CAPSULE | Freq: Once | ORAL | Status: AC
  Administered 2021-12-11: 25 mg via ORAL

## 2021-12-11 MED ORDER — IOPAMIDOL (ISOVUE-300) INJECTION 61%
75.0000 mL | Freq: Once | INTRAVENOUS | Status: AC | PRN
  Administered 2021-12-11: 75 mL via INTRAVENOUS

## 2021-12-11 NOTE — Discharge Instructions (Signed)
Nursing staff called over to CT due to itching after CT contrast administration. Assessed pt, 1 hive noted on pts left chest. Pt denies SOB, itchy throat. Pt alert and speaking in complete sentences. Dr. Maree Erie assessed pt and ordered to give 25 mg of PO benadryl. Benadryl administered and pt watched for 15 minutes to ensure no new symptoms. Pt discharged from facility with no new hives and itching subsided. Pt advised to take additional 25 mg of benadryl and seek medical attention if any new or worsening symptoms appeared. Pt verbalized understanding.  ?

## 2021-12-14 NOTE — Progress Notes (Signed)
GYNECOLOGY  VISIT ?  ?HPI: ?61 y.o.   Married  Caucasian  female   ?[id number] with Patient's last menstrual period was 07/08/2020 (approximate).   ?here for 6 weeks status post ?TOTAL LAPAROSCOPIC HYSTERECTOMY WITH BILATERAL SALPINGOOOPHORECTOMY. (Bilateral: Abdomen) CYSTOSCOPY (Urethra)  ?ANTERIOR AND POSTERIOR COLPORRHAPHY (Vagina) TRANSVAGINAL TAPE EXACT MIDURETHRAL (Vagina ). ? ?Voiding well.  ?No leakage of urine.  ?Good bowel function.  ?No bleeding or spotting.  ? ?Doing limited work.  ? ?Has Miralax and Colace at home. ? ?GYNECOLOGIC HISTORY: ?Patient's last menstrual period was 07/08/2020 (approximate). ?Contraception:  Tubal/Hyst ?Menopausal hormone therapy:  none ?Last mammogram:  08-21-21 Neg/Birads1 ?Last pap smear:   06-15-21 Neg:Neg HR HPV, 03-28-16 Neg:Neg HR HPV, 02-04-14 Neg:Neg HR HPV ?       ?OB History   ? ? Gravida  ?0  ? Para  ?0  ? Term  ?0  ? Preterm  ?   ? AB  ?0  ? Living  ?   ?  ? ? SAB  ?   ? IAB  ?   ? Ectopic  ?0  ? Multiple  ?   ? Live Births  ?   ?   ?  ?  ?    ? ?Patient Active Problem List  ? Diagnosis Date Noted  ? Status post laparoscopic hysterectomy 11/06/2021  ? At risk for depression 03/15/2021  ? At risk for deficient intake of food 12/19/2020  ? Prediabetes 12/19/2020  ? At risk for malnutrition 11/09/2020  ? SOBOE (shortness of breath on exertion) 10/13/2020  ? Fatigue 10/13/2020  ? Other hyperlipidemia 10/13/2020  ? OSA on CPAP 10/13/2020  ? Vitamin D deficiency 10/13/2020  ? Gastroesophageal reflux disease 10/13/2020  ? Mood disorder ([place]), with emotional eating 10/13/2020  ? At risk for heart disease 10/13/2020  ? Ingrown toenail 05/17/2020  ? Plantar fasciitis 05/17/2020  ? Social anxiety disorder   ? ? ?Past Medical History:  ?Diagnosis Date  ? Acid reflux   ? Anxiety   ? Back pain   ? Bladder prolapse, female, acquired   ? C. difficile colitis   ? from cephalosporins  ? Chondromalacia   ? COVID 08/12/2021  ? mild symptoms  ? Dysplasia of cervix, low grade (CIN 1) 1991  ?  Fibroids   ? uterine  ? Heartburn   ? High cholesterol   ? History of COVID-19 06/05/2021  ? History of prediabetes   ? last A1C on 10/04/21 was 5.6  ? HSV-1 infection 2010  ? cold sores  ? Lumbar spondylosis   ? Mild depression   ? Palpitations   ? Plantar fasciitis, bilateral   ? Simple endometrial hyperplasia without atypia 08/2021  ? Sleep apnea 04/02/2017  ? home sleep study, Pulmonologist LOV 08/23/2017 with Dr. Jeanella Cara. Patient uses cpap nightly per pt on 10/26/21.  ? SOBOE (shortness of breath on exertion) 10/13/2020  ? per MD note from [facility],  SOBOE likely related to obesity & exericise induced.  ? Social anxiety disorder 1998  ? Swallowing difficulty   ? feeling of fullness in upper chest, resolved as of 10/26/2021 per pt  ? Swelling of both lower extremities   ? hx of left ankle swelling and knee arthritis  ? Vitamin D deficiency   ? Wears contact lenses   ? ? ?Past Surgical History:  ?Procedure Laterality Date  ? ANTERIOR (CYSTOCELE) AND POSTERIOR REPAIR (RECTOCELE) WITH XENFORM GRAFT AND SACROSPINOUS FIXATION N/A 11/06/2021  ?  Procedure: ANTERIOR AND POSTERIOR COLPORRHAPHY;  Surgeon: Nunzio Cobbs, MD;  Location: [facility];  Service: Gynecology;  Laterality: N/A;  ? APPENDECTOMY  1972  ? BLADDER SUSPENSION N/A 11/06/2021  ? Procedure: TRANSVAGINAL TAPE EXACT MIDURETHRAL;  Surgeon: Nunzio Cobbs, MD;  Location: [facility];  Service: Gynecology;  Laterality: N/A;  ? BREAST ENHANCEMENT SURGERY  10/08/2006  ? Saline Harlow Mares)  ? COLONOSCOPY    ? approximately 2014, had previous colonoscopy 5 years ealier  ? CYSTOSCOPY N/A 11/06/2021  ? Procedure: CYSTOSCOPY;  Surgeon: Nunzio Cobbs, MD;  Location: [facility];  Service: Gynecology;  Laterality: N/A;  ? DG BARIUM SWALLOW ([place] [facility])    ? around 2022  ? TOTAL LAPAROSCOPIC HYSTERECTOMY WITH SALPINGECTOMY Bilateral 11/06/2021  ? Procedure: TOTAL LAPAROSCOPIC  HYSTERECTOMY WITH BILATERAL SALPINGOOOPHORECTOMY.;  Surgeon: Nunzio Cobbs, MD;  Location: [facility];  Service: Gynecology;  Laterality: Bilateral;  ? TUBAL LIGATION  01/1995  ? ? ?Current Outpatient Medications  ?Medication Sig Dispense Refill  ? buPROPion (WELLBUTRIN) 75 MG tablet Take 1 tablet (75 mg total) by mouth 2 (two) times daily. 60 tablet 0  ? escitalopram (LEXAPRO) 20 MG tablet Take 20 mg by mouth daily.    ? neomycin-polymyxin-hydrocortisone (CORTISPORIN) 3.5-10000-1 OTIC suspension 4 drops daily as needed.    ? omeprazole (PRILOSEC) 20 MG capsule Take 20 mg by mouth as needed.    ? rosuvastatin (CRESTOR) 10 MG tablet SMARTSIG:1 Tablet(s) By Mouth Every Evening    ? triamcinolone cream (KENALOG) 0.1 % daily as needed.  2  ? valACYclovir (VALTREX) 500 MG tablet Take one tablet (500 mg) by mouth daily.  Take one tablet (500 mg) by mouth twice daily for an outbreak. (Patient taking differently: Take one tablet (500 mg) by mouth daily.  Take one tablet (500 mg) by mouth twice daily for an outbreak. Patient only takes as needed per pt on 10/26/21.) 100 tablet 3  ? Vitamin D, Ergocalciferol, (DRISDOL) 1.25 MG (50000 UNIT) CAPS capsule Take 1 capsule (50,000 Units total) by mouth every 7 (seven) days. 4 capsule 0  ? ?No current facility-administered medications for this visit.  ?  ? ?ALLERGIES: Iodinated contrast media and Cephalosporins ? ?Family History  ?Problem Relation Age of Onset  ? Cancer Father   ?     lung  ? Thyroid disease Father   ? Cancer Brother   ?     testicular cancer  ? Osteoarthritis Mother   ? Migraines Mother   ? Thyroid disease Mother   ? Depression Mother   ? Anxiety disorder Mother   ? Obesity Mother   ? Osteoarthritis Maternal Grandmother   ? Hypertension Paternal Grandmother   ? Stroke Paternal Grandmother   ? ? ?Social History  ? ?Socioeconomic History  ? Marital status: Married  ?  Spouse name: R Tomi Bamberger  ? Number of children: Not on file  ? Years of  education: Not on file  ? Highest education level: Not on file  ?Occupational History  ? Not on file  ?Tobacco Use  ? Smoking status: Former  ?  Years: 10.00  ?  Types: Cigarettes  ?  Quit date: 2012  ?  Years since quitting: 11.2  ? Smokeless tobacco: Never  ?Vaping Use  ? Vaping Use: Never used  ?Substance and Sexual Activity  ? Alcohol use: Yes  ?  Comment: 1-3 glasses of wine per  month  ? Drug use: No  ? Sexual activity: Yes  ?  Partners: Male  ?  Birth control/protection: Surgical, Post-menopausal  ?  Comment: BTL  ?Other Topics Concern  ? Not on file  ?Social History Narrative  ? Not on file  ? ?Social Determinants of Health  ? ?Financial Resource Strain: Not on file  ?Food Insecurity: Not on file  ?Transportation Needs: Not on file  ?Physical Activity: Not on file  ?Stress: Not on file  ?Social Connections: Not on file  ?Intimate Partner Violence: Not on file  ? ? ?Review of Systems  ?All other systems reviewed and are negative. ? ?PHYSICAL EXAMINATION:   ? ?BP 122/74   Pulse 82   Ht '5\' 3"'$  (1.6 m)   Wt 186 lb (84.4 kg)   LMP 07/08/2020 (Approximate)   SpO2 95%   BMI 32.95 kg/m?     ?General appearance: alert, cooperative and appears stated age ? ?Abdomen: incisions intact.  Abdomen is soft, non-tender, no masses,  no organomegaly ? ?Pelvic: External genitalia:  no lesions ?             Urethra:  normal appearing urethra with no masses, tenderness or lesions ?             Bartholins and Skenes: normal    ?             Vagina: suture present on anterior and posterior vaginal wall and at cuff.  Sling protected. ?             Cervix: absent ?               ?Bimanual Exam:  Uterus:  absent ?             Adnexa: no mass, fullness, tenderness ?            ? ?Chaperone was present for exam:  Estill Bamberg, CMA ? ?ASSESSMENT ? ?Total laparoscopic hysterectomy with bilateral salpingo-oophorectomy, lysis of adhesion of right adnexa, TVT Exact midurethral sling, cystoscopy, anterior and posterior colporrhaphy. ?Doing well  post op.  ? ?PLAN ? ?Continue pelvic rest and no lifting, carrying, pulling, or hauling over 10 pounds for 6 more weeks.  ?FU in 6 weeks.  ?  ?An After Visit Summary was printed and given to the patient. ? ? ? ?

## 2021-12-18 ENCOUNTER — Encounter: Payer: Self-pay | Admitting: Obstetrics and Gynecology

## 2021-12-18 ENCOUNTER — Ambulatory Visit (INDEPENDENT_AMBULATORY_CARE_PROVIDER_SITE_OTHER): Payer: 59 | Admitting: Obstetrics and Gynecology

## 2021-12-18 ENCOUNTER — Other Ambulatory Visit: Payer: Self-pay

## 2021-12-18 VITALS — BP 122/74 | HR 82 | Ht 63.0 in | Wt 186.0 lb

## 2021-12-18 DIAGNOSIS — Z9889 Other specified postprocedural states: Secondary | ICD-10-CM

## 2021-12-31 NOTE — Progress Notes (Signed)
? ?Lisa Wade, female    DOB: May 13, 1961    MRN: [id number] ? ? ?Brief patient profile:  ?60 yowf quit smoking 2012 @ wt 150  referred to pulmonary clinic 01/01/2022 by [facility] for cough/sob and h/o hemoptysis p ET 11/06/21 for hysterectomy   ? ?2017 sleep disorder > cpap and did fine     ? ? ?History of Present Illness  ?01/01/2022  Pulmonary/ 1st office eval/Lisa Wade   ?Chief Complaint  ?Patient presents with  ? Consult  ?  SOB. Spitting up blood 10 days following surgery  ?Sensation of flutter in chest and urge to cough very transient daily up to daily 2022 > corrected on omeprazole but then stopped it and underwent ET for hystectomy 11/06/21 with hemoptyis x 10 days then resolved and has not recurred. No assoc cp ?Dyspnea:  since surgery sitting still 10-15 min   not every day  and resolves on its  own / somewhat knees limited  ?Cough: some every day > white now, none noct  ?Sleep: bed is flat sleeps fine  ?SABA use: once a day seems to help some ? ? ?No obvious day to day or daytime variability or assoc excess/ purulent sputum or mucus plugs or ongoing hemoptysis or cp or chest tightness, subjective wheeze or overt sinus or hb symptoms.  ? ?Sleeping  without nocturnal  or early am exacerbation  of respiratory  c/o's or need for noct saba. Also denies any obvious fluctuation of symptoms with weather or environmental changes or other aggravating or alleviating factors except as outlined above  ? ?No unusual exposure hx or h/o childhood pna/ asthma or knowledge of premature birth. ? ?Current Allergies, Complete Past Medical History, Past Surgical History, Family History, and Social History were reviewed in [employer] record. ? ?ROS  The following are not active complaints unless bolded ?Hoarseness, sore throat, dysphagia, dental problems, itching, sneezing,  nasal congestion or discharge of excess mucus or purulent secretions, ear ache,   fever, chills, sweats, unintended wt loss or wt gain,  classically pleuritic or exertional cp,  orthopnea pnd or arm/hand swelling  or leg swelling, presyncope, palpitations, abdominal pain, anorexia, nausea, vomiting, diarrhea  or change in bowel habits or change in bladder habits, change in stools or change in urine, dysuria, hematuria,  rash, arthralgias, visual complaints, headache, numbness, weakness or ataxia or problems with walking or coordination,  change in mood or  memory. ?      ?   ? ?Past Medical History:  ?Diagnosis Date  ? Acid reflux   ? Anxiety   ? Back pain   ? Bladder prolapse, female, acquired   ? C. difficile colitis   ? from cephalosporins  ? Chondromalacia   ? COVID 08/12/2021  ? mild symptoms  ? Dysplasia of cervix, low grade (CIN 1) 1991  ? Fibroids   ? uterine  ? Heartburn   ? High cholesterol   ? History of COVID-19 06/05/2021  ? History of prediabetes   ? last A1C on 10/04/21 was 5.6  ? HSV-1 infection 2010  ? cold sores  ? Lumbar spondylosis   ? Mild depression   ? Palpitations   ? Plantar fasciitis, bilateral   ? Simple endometrial hyperplasia without atypia 08/2021  ? Sleep apnea 04/02/2017  ? home sleep study, Pulmonologist LOV 08/23/2017 with Dr. Jeanella Cara. Patient uses cpap nightly per pt on 10/26/21.  ? SOBOE (shortness of breath on exertion) 10/13/2020  ? per MD note  from [facility],  SOBOE likely related to obesity & exericise induced.  ? Social anxiety disorder 1998  ? Swallowing difficulty   ? feeling of fullness in upper chest, resolved as of 10/26/2021 per pt  ? Swelling of both lower extremities   ? hx of left ankle swelling and knee arthritis  ? Vitamin D deficiency   ? Wears contact lenses   ? ? ?Outpatient Medications Prior to Visit  ?Medication Sig Dispense Refill  ? buPROPion (WELLBUTRIN) 100 MG tablet Take 1 tablet (100 mg total) by mouth 2 (two) times daily. 60 tablet 0  ? escitalopram (LEXAPRO) 20 MG tablet Take 20 mg by mouth daily.    ? omeprazole (PRILOSEC) 20 MG capsule Take 20 mg by mouth as needed.     ? rosuvastatin (CRESTOR) 10 MG tablet SMARTSIG:1 Tablet(s) By Mouth Every Evening    ? triamcinolone cream (KENALOG) 0.1 % daily as needed.  2  ? valACYclovir (VALTREX) 500 MG tablet Take one tablet (500 mg) by mouth daily.  Take one tablet (500 mg) by mouth twice daily for an outbreak. (Patient taking differently: Take one tablet (500 mg) by mouth daily.  Take one tablet (500 mg) by mouth twice daily for an outbreak. Patient only takes as needed per pt on 10/26/21.) 100 tablet 3  ? Vitamin D, Ergocalciferol, (DRISDOL) 1.25 MG (50000 UNIT) CAPS capsule Take 1 capsule (50,000 Units total) by mouth every 7 (seven) days. 4 capsule 0  ? ?No facility-administered medications prior to visit.  ? ? ? ?Objective:  ?  ? ?BP 126/74 (BP Location: Left Arm, Patient Position: Sitting, Cuff Size: Normal)   Pulse 66   Temp 98.5 ?F (36.9 ?C) (Oral)   Ht '5\' 4"'$  (1.626 m)   Wt 195 lb (88.5 kg)   LMP 07/08/2020 (Approximate)   SpO2 95% Comment: ra  BMI 33.47 kg/m?  ? ?SpO2: 95 % (ra) ? ? HEENT : pt wearing mask not removed for exam due to covid -19 concerns.  ? ? ?NECK :  without JVD/Nodes/TM/ nl carotid upstrokes bilaterally ? ? ?LUNGS: no acc muscle use,  Nl contour chest which is clear to A and P bilaterally without cough on insp or exp maneuvers ? ? ?CV:  RRR  no s3 or murmur or increase in P2, and no edema  ? ?ABD:  soft and nontender with nl inspiratory excursion in the supine position. No bruits or organomegaly appreciated, bowel sounds nl ? ?MS:  Nl gait/ ext warm without deformities, calf tenderness, cyanosis or clubbing ?No obvious joint restrictions  ? ?SKIN: warm and dry without lesions   ? ?NEURO:  alert, approp, nl sensorium with  no motor or cerebellar deficits apparent.  ? ? ?I personally reviewed images and agree with radiology impression as follows:  ? Chest CT w contrast  12/11/21 ?No acute abnormality seen in the chest. ? ?   ?Assessment  ? ?Upper airway cough syndrome ?Onset was around 2021 did initially  respond to omeprazole but did not maintain ?- severe off ppi  p ET Nov 06 2021 with transient hemoptysis and variable sob (not reproduced with ex)  and net CT 12/11/21 ?- 01/01/2022   Walked on RA  x  3  lap(s) =  approx 750  ft  @ mod pace, stopped due to end of study s sob  with lowest 02 sats 94%   ? ?Upper airway cough syndrome (previously labeled PNDS),  is so named because it's frequently  impossible to sort out how much is  CR/sinusitis with freq throat clearing (which can be related to primary GERD)   vs  causing  secondary (" extra esophageal")  GERD from wide swings in gastric pressure that occur with throat clearing, often  promoting self use of mint and menthol lozenges that reduce the lower esophageal sphincter tone and exacerbate the problem further in a cyclical fashion. ET tube just makes the airway all the more sensitive to the effects of low grade GERD  ? ?These are the same pts (now being labeled as having "irritable larynx syndrome" by some cough centers) who not infrequently have a history of having failed to tolerate ace inhibitors,  dry powder inhalers or biphosphonates or report having atypical/extraesophageal reflux symptoms that don't respond to standard doses of PPI  and are easily confused as having aecopd or asthma flares by even experienced allergists/ pulmonologists (myself included).  ? ?rec ?Max rx with ppi (bid ac) and gerd diet  ? ?approp use of saba can continue prn but should not be needed. ? ?Next step is ENT eval if not responding. ? ? ?Each maintenance medication was reviewed in detail including emphasizing most importantly the difference between maintenance and prns and under what circumstances the prns are to be triggered using an action plan format where appropriate. ? ?Total time for H and P, chart review, counseling,  directly observing portions of ambulatory 02 saturation study/ and generating customized AVS unique to this office visit / same day charting  > 45 min  ?     ?   ?       ? ? ? ? ?Lisa Gully, MD ?01/01/2022 ?  ?

## 2022-01-01 ENCOUNTER — Encounter: Payer: Self-pay | Admitting: Internal Medicine

## 2022-01-01 ENCOUNTER — Encounter (INDEPENDENT_AMBULATORY_CARE_PROVIDER_SITE_OTHER): Payer: Self-pay | Admitting: Family Medicine

## 2022-01-01 ENCOUNTER — Ambulatory Visit (INDEPENDENT_AMBULATORY_CARE_PROVIDER_SITE_OTHER): Payer: 59 | Admitting: Family Medicine

## 2022-01-01 ENCOUNTER — Ambulatory Visit (INDEPENDENT_AMBULATORY_CARE_PROVIDER_SITE_OTHER): Payer: 59 | Admitting: Internal Medicine

## 2022-01-01 ENCOUNTER — Other Ambulatory Visit: Payer: Self-pay

## 2022-01-01 VITALS — BP 105/68 | HR 74 | Temp 97.8°F | Ht 63.0 in | Wt 188.0 lb

## 2022-01-01 DIAGNOSIS — E7849 Other hyperlipidemia: Secondary | ICD-10-CM | POA: Diagnosis not present

## 2022-01-01 DIAGNOSIS — R058 Other specified cough: Secondary | ICD-10-CM | POA: Diagnosis not present

## 2022-01-01 DIAGNOSIS — Z9189 Other specified personal risk factors, not elsewhere classified: Secondary | ICD-10-CM | POA: Diagnosis not present

## 2022-01-01 DIAGNOSIS — E669 Obesity, unspecified: Secondary | ICD-10-CM

## 2022-01-01 DIAGNOSIS — R7303 Prediabetes: Secondary | ICD-10-CM | POA: Diagnosis not present

## 2022-01-01 DIAGNOSIS — E559 Vitamin D deficiency, unspecified: Secondary | ICD-10-CM | POA: Diagnosis not present

## 2022-01-01 DIAGNOSIS — F39 Unspecified mood [affective] disorder: Secondary | ICD-10-CM | POA: Diagnosis not present

## 2022-01-01 DIAGNOSIS — Z6833 Body mass index (BMI) 33.0-33.9, adult: Secondary | ICD-10-CM

## 2022-01-01 MED ORDER — BUPROPION HCL 100 MG PO TABS: 100.0000 mg | ORAL_TABLET | Freq: Two times a day (BID) | ORAL | 0 refills | Status: DC

## 2022-01-01 MED ORDER — VITAMIN D (ERGOCALCIFEROL) 1.25 MG (50000 UNIT) PO CAPS: 50000.0000 [IU] | ORAL_CAPSULE | ORAL | 0 refills | Status: DC

## 2022-01-01 NOTE — Patient Instructions (Addendum)
Prilosec (omeprazole) 20 mg Take 30- 60 min before your first and last meals of the day until all better for at least a month ? ? ?GERD (REFLUX)  is an extremely common cause of respiratory symptoms just like yours , many times with no obvious heartburn at all.  ? ? It can be treated with medication, but also with lifestyle changes including elevation of the head of your bed (ideally with 6 -8inch blocks under the headboard of your bed),  Smoking cessation, avoidance of late meals, excessive alcohol, and avoid fatty foods, chocolate, peppermint, colas, red wine, and acidic juices such as orange juice.  ?NO MINT OR MENTHOL PRODUCTS SO NO COUGH DROPS  ?USE SUGARLESS CANDY INSTEAD (Jolley ranchers or Stover's or Life Savers) or even ice chips will also do - the key is to swallow to prevent all throat clearing. ?NO OIL BASED VITAMINS - use powdered substitutes.  Avoid fish oil when coughing.  ? ?Only use your albuterol as a rescue medication to be used if you can't catch your breath by resting or doing a relaxed purse lip breathing pattern.  ?- The less you use it, the better it will work when you need it. ?- Ok to use up to 2 puffs  every 4 hours if you must but call for immediate appointment if use goes up over your usual need ?- Don't leave home without it !!  (think of it like the spare tire for your car)  ? ?Ok to try albuterol 15 min before an activity (on alternating days)  that you know would usually make you short of breath and see if it makes any difference and if makes none then don't take albuterol after activity unless you can't catch your breath as this means it's the resting that helps, not the albuterol. ? ?We will walk you today to get a baseline level of exercise tolerance  ? ?Call if not all better in 4 weeks  ?     ?

## 2022-01-01 NOTE — Assessment & Plan Note (Addendum)
Onset was around 2021 did initially respond to omeprazole but did not maintain ?- severe off ppi  p ET Nov 06 2021 with transient hemoptysis and variable sob (not reproduced with ex)  and net CT 12/11/21 ?- 01/01/2022   Walked on RA  x  3  lap(s) =  approx 750  ft  @ mod pace, stopped due to end of study s sob  with lowest 02 sats 94%   ? ?Upper airway cough syndrome (previously labeled PNDS),  is so named because it's frequently impossible to sort out how much is  CR/sinusitis with freq throat clearing (which can be related to primary GERD)   vs  causing  secondary (" extra esophageal")  GERD from wide swings in gastric pressure that occur with throat clearing, often  promoting self use of mint and menthol lozenges that reduce the lower esophageal sphincter tone and exacerbate the problem further in a cyclical fashion. ET tube just makes the airway all the more sensitive to the effects of low grade GERD  ? ?These are the same pts (now being labeled as having "irritable larynx syndrome" by some cough centers) who not infrequently have a history of having failed to tolerate ace inhibitors,  dry powder inhalers or biphosphonates or report having atypical/extraesophageal reflux symptoms that don't respond to standard doses of PPI  and are easily confused as having aecopd or asthma flares by even experienced allergists/ pulmonologists (myself included).  ? ?rec ?Max rx with ppi (bid ac) and gerd diet  ? ?approp use of saba can continue prn but should not be needed. ? ?Next step is ENT eval if not responding. ? ? ?Each maintenance medication was reviewed in detail including emphasizing most importantly the difference between maintenance and prns and under what circumstances the prns are to be triggered using an action plan format where appropriate. ? ?Total time for H and P, chart review, counseling,  directly observing portions of ambulatory 02 saturation study/ and generating customized AVS unique to this office visit /  same day charting  > 45 min  ?     ?  ?       ?

## 2022-01-02 LAB — LIPID PANEL
Chol/HDL Ratio: 2.7 ratio (ref 0.0–4.4)
Cholesterol, Total: 156 mg/dL (ref 100–199)
HDL: 58 mg/dL (ref >39)
LDL Chol Calc (NIH): 73 mg/dL (ref 0–99)
Triglycerides: 145 mg/dL (ref 0–149)
VLDL Cholesterol Cal: 25 mg/dL (ref 5–40)

## 2022-01-02 LAB — COMPREHENSIVE METABOLIC PANEL
ALT: 26 IU/L (ref 0–32)
AST: 20 IU/L (ref 0–40)
Albumin/Globulin Ratio: 2.2 (ref 1.2–2.2)
Albumin: 4.6 g/dL (ref 3.8–4.9)
Alkaline Phosphatase: 84 IU/L (ref 44–121)
BUN/Creatinine Ratio: 16 (ref 9–23)
BUN: 13 mg/dL (ref 6–24)
Bilirubin Total: 0.4 mg/dL (ref 0.0–1.2)
CO2: 21 mmol/L (ref 20–29)
Calcium: 10.1 mg/dL (ref 8.7–10.2)
Chloride: 107 mmol/L — ABNORMAL HIGH (ref 96–106)
Creatinine, Ser: 0.83 mg/dL (ref 0.57–1.00)
Globulin, Total: 2.1 g/dL (ref 1.5–4.5)
Glucose: 95 mg/dL (ref 70–99)
Potassium: 4.8 mmol/L (ref 3.5–5.2)
Sodium: 143 mmol/L (ref 134–144)
Total Protein: 6.7 g/dL (ref 6.0–8.5)
eGFR: 82 mL/min/{1.73_m2} (ref >59)

## 2022-01-02 LAB — HEMOGLOBIN A1C
Est. average glucose Bld gHb Est-mCnc: 111 mg/dL
Hgb A1c MFr Bld: 5.5 % (ref 4.8–5.6)

## 2022-01-02 LAB — VITAMIN D 25 HYDROXY (VIT D DEFICIENCY, FRACTURES): Vit D, 25-Hydroxy: 36 ng/mL (ref 30.0–100.0)

## 2022-01-02 LAB — INSULIN, RANDOM: INSULIN: 10.6 u[IU]/mL (ref 2.6–24.9)

## 2022-01-03 NOTE — Progress Notes (Signed)
? ? ? ?Chief Complaint:  ? ?OBESITY ?Lisa Wade is here to discuss her progress with her obesity treatment plan along with follow-up of her obesity related diagnoses. Lisa Wade is on the Category 2 Plan or keeping a food journal and adhering to recommended goals of 1500-1600 calories and 100 grams of protein daily and states she is following her eating plan approximately 50% of the time. Lisa Wade states she is doing 0 minutes 0 times per week. ? ?Today's visit was #: 20 ?Starting weight: 185 lbs ?Starting date: 10/13/2020 ?Today's weight: 188 lbs ?Today's date: 01/01/2022 ?Total lbs lost to date: 0 ?Total lbs lost since last in-office visit: 1 ? ?Interim History: Lisa Wade is not journaling or really following the meal plan that closely and still skips meals. She did make some recipes we gave her from last office visit, and she really enjoyed them.  ?- Still difficult to focus on herself and care for self with busy work days and life's challenges. 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 ? ?Subjective:  ? ?1. Other hyperlipidemia ?Lisa Wade is taking Crestor, over 80% of the time she remembers to take it.  ? ?2. Pre-diabetes ?Lisa Wade is not eating protein and eating a lot of carbs therefore, she craves them. ? ?3. Vitamin D deficiency ?She is currently taking prescription vitamin D 50,000 IU each week. She denies nausea, vomiting or muscle weakness. ? ?4. Mood disorder, with emotional eating ?Lisa Wade is only taking it once daily.  ? ?5. At risk for diabetes mellitus ?Lisa Wade is at higher than average risk for developing diabetes due to her obesity. ? ? ? ?Assessment/Plan:  ? ?Orders Placed This Encounter  ?Procedures  ? Lipid panel  ? Comprehensive metabolic panel  ? Hemoglobin A1c  ? VITAMIN D 25 Hydroxy (Vit-D Deficiency, Fractures)  ? Insulin, random  ? ? ?Medications Discontinued During This Encounter  ?Medication Reason  ? neomycin-polymyxin-hydrocortisone (CORTISPORIN)  3.5-10000-1 OTIC suspension   ? Vitamin D, Ergocalciferol, (DRISDOL) 1.25 MG (50000 UNIT) CAPS capsule Reorder  ? buPROPion (WELLBUTRIN) 75 MG tablet Reorder  ?  ? ?Meds ordered this encounter  ?Medications  ? buPROPion (WELLBUTRIN) 100 MG tablet  ?  Sig: Take 1 tablet (100 mg total) by mouth 2 (two) times daily.  ?  Dispense:  60 tablet  ?  Refill:  0  ?  30 d supply;  ** OV for RF **   Do not send RF request  ? Vitamin D, Ergocalciferol, (DRISDOL) 1.25 MG (50000 UNIT) CAPS capsule  ?  Sig: Take 1 capsule (50,000 Units total) by mouth every 7 (seven) days.  ?  Dispense:  4 capsule  ?  Refill:  0  ?  Ov for rf  ?  ? ?1. Other hyperlipidemia ?We will check labs today and will follow up Lisa Wade's next office visit. ? ?- Lipid panel ?- Comprehensive metabolic panel ? ?2. Pre-diabetes ?We will check labs today, and will consider adding metformin at her next office visit. Lisa Wade will increase protein and hit journal goals. ? ?3. Vitamin D deficiency ?We will check labs today, and we will refill prescription Vitamin D for 1 month. ? ?- Vitamin D, Ergocalciferol, (DRISDOL) 1.25 MG (50000 UNIT) CAPS capsule; Take 1 capsule (50,000 Units total) by mouth every 7 (seven) days.  Dispense: 4 capsule; Refill: 0 ? ?4. Mood disorder, with emotional eating ?Lisa Wade agreed to increase Wellbutrin from 75 mg BID to  100 mg BID, and we will refill for 1 month. Improving nutrition will improve mood as wel. Start walking some. ? ?- buPROPion (WELLBUTRIN) 100 MG tablet; Take 1 tablet (100 mg total) by mouth 2 (two) times daily.  Dispense: 60 tablet; Refill: 0 ? ?5. At risk for diabetes mellitus ?- Lisa Wade was given diabetes prevention education and counseling today of more than 10 minutes.  ?- Counseled patient on pathophysiology of disease and meaning/ implication of lab results.  ?- Reviewed how certain foods can either stimulate or inhibit insulin release, and subsequently affect hunger pathways  ?- Importance of following a healthy meal plan with  limiting amounts of simple carbohydrates discussed with patient ?- Effects of regular aerobic exercise on blood sugar regulation reviewed and encouraged an eventual goal of 30 min 5d/week or more as a minimum.  ?- Briefly discussed treatment options, which always include dietary and lifestyle modification as first line.   ?- Handouts provided at patient's desire   ? ?6. Obesity with current BMI of 33.4 ?Earnie is currently in the action stage of change. As such, her goal is to continue with weight loss efforts.  She has agreed to follow / use "Category 2 with breakfast options" as a guide and will journal her calorie and protein intake (with a goal 1500-1600 calories and 100 grams of protein daily), and bring in log to her next office visit. ? ?Exercise goals: As is. ? ?Behavioral modification strategies: increasing lean protein intake, decreasing simple carbohydrates, and planning for success. ? ?Persais has agreed to follow-up with our clinic in 4 to 5 weeks. She was informed of the importance of frequent follow-up visits to maximize her success with intensive lifestyle modifications for her multiple health conditions.  ? ?Lisa Wade was informed we would discuss her lab results at her next visit unless there is a critical issue that needs to be addressed sooner. Lisa Wade agreed to keep her next visit at the agreed upon time to discuss these results. ? ? ? ?Objective:  ? ?Blood pressure 105/68, pulse 74, temperature 97.8 ?F (36.6 ?C), height '5\' 3"'$  (1.6 m), weight 188 lb (85.3 kg), last menstrual period 07/08/2020, SpO2 95 %. ?Body mass index is 33.3 kg/m?. ? ?General: Cooperative, alert, well developed, in no acute distress. ?HEENT: Conjunctivae and lids unremarkable. ?Cardiovascular: Regular rhythm.  ?Lungs: Normal work of breathing. ?Neurologic: No focal deficits.  ? ?Lab Results  ?Component Value Date  ? CREATININE 0.83 01/01/2022  ? BUN 13 01/01/2022  ? NA 143 01/01/2022  ? K 4.8 01/01/2022  ? CL 107 (H) 01/01/2022  ? CO2 21  01/01/2022  ? ?Lab Results  ?Component Value Date  ? ALT 26 01/01/2022  ? AST 20 01/01/2022  ? ALKPHOS 84 01/01/2022  ? BILITOT 0.4 01/01/2022  ? ?Lab Results  ?Component Value Date  ? HGBA1C 5.5 01/01/2022  ? HGBA1C 5.6 10/04/2021  ? HGBA1C 5.7 (H) 02/06/2021  ? HGBA1C 5.8 (H) 10/13/2020  ? ?Lab Results  ?Component Value Date  ? INSULIN 10.6 01/01/2022  ? INSULIN 11.0 02/06/2021  ? INSULIN 11.4 10/13/2020  ? ?Lab Results  ?Component Value Date  ? TSH 1.070 10/13/2020  ? ?Lab Results  ?Component Value Date  ? CHOL 156 01/01/2022  ? HDL 58 01/01/2022  ? [place] 73 01/01/2022  ? TRIG 145 01/01/2022  ? CHOLHDL 2.7 01/01/2022  ? ?Lab Results  ?Component Value Date  ? VD25OH 36.0 01/01/2022  ? VD25OH 37.1 02/06/2021  ? VD25OH 13.0 (L) 10/13/2020  ? ?  Lab Results  ?Component Value Date  ? WBC 14.2 (H) 11/07/2021  ? HGB 11.3 (L) 11/07/2021  ? HCT 34.4 (L) 11/07/2021  ? MCV 88.0 11/07/2021  ? PLT 234 11/07/2021  ? ?No results found for: IRON, TIBC, FERRITIN ? ? ? ?Attestation Statements:  ? ?Reviewed by clinician on day of visit: allergies, medications, problem list, medical history, surgical history, family history, social history, and previous encounter notes. ? ? ?I, Trixie Dredge, am acting as transcriptionist for [employer], DO. ? ?I have reviewed the above documentation for accuracy and completeness, and I agree with the above. Marjory Sneddon, D.O. ? ?The [place] was signed into law in 2016 which includes the topic of electronic health records.  This provides immediate access to information in MyChart.  This includes consultation notes, operative notes, office notes, lab results and pathology reports.  If you have any questions about what you read please let us know at your next visit so we can discuss your concerns and take corrective action if need be.  We are right here with you. ? ? ?

## 2022-01-11 ENCOUNTER — Other Ambulatory Visit: Payer: Self-pay | Admitting: Family Medicine

## 2022-01-11 DIAGNOSIS — E2839 Other primary ovarian failure: Secondary | ICD-10-CM

## 2022-01-11 DIAGNOSIS — Z1231 Encounter for screening mammogram for malignant neoplasm of breast: Secondary | ICD-10-CM

## 2022-01-23 NOTE — Progress Notes (Signed)
GYNECOLOGY  VISIT ?  ?HPI: ?61 y.o.   Married  Caucasian  female   ?[id number] with Patient's last menstrual period was 07/08/2020 (approximate).   ?here for 12 weeks status post   ?TOTAL LAPAROSCOPIC HYSTERECTOMY WITH BILATERAL SALPINGOOOPHORECTOMY. (Bilateral: Abdomen) CYSTOSCOPY (Urethra)  ?ANTERIOR AND POSTERIOR COLPORRHAPHY (Vagina) TRANSVAGINAL TAPE EXACT MIDURETHRAL (Vagina ). ? ?Sometimes she feels like her bladder is sleepy first thing in the am.  ?She leans forward to assist with voiding.  ?Stands and then sits back down to complete her voiding.  ?No leak with laugh, cough, or sneeze.  ?Normal bowel function.  ? ?Back at work.  ? ?Developed some back pain after vacuuming.  ?Took left over Percocet.  ? ?Going to [facility].  ? ?GYNECOLOGIC HISTORY: ?Patient's last menstrual period was 07/08/2020 (approximate). ?Contraception:  Tubal/Hyst ?Menopausal hormone therapy:  none ?Last mammogram:  08-21-21 Neg/Birads1 ?Last pap smear:   06-15-21 Neg:Neg HR HPV, 03-28-16 Neg:Neg HR HPV, 02-04-14 Neg:Neg HR HPV ?       ?OB History   ? ? Gravida  ?0  ? Para  ?0  ? Term  ?0  ? Preterm  ?   ? AB  ?0  ? Living  ?   ?  ? ? SAB  ?   ? IAB  ?   ? Ectopic  ?0  ? Multiple  ?   ? Live Births  ?   ?   ?  ?  ?    ? ?Patient Active Problem List  ? Diagnosis Date Noted  ? Upper airway cough syndrome 01/01/2022  ? Status post laparoscopic hysterectomy 11/06/2021  ? At risk for depression 03/15/2021  ? At risk for deficient intake of food 12/19/2020  ? Prediabetes 12/19/2020  ? At risk for malnutrition 11/09/2020  ? SOBOE (shortness of breath on exertion) 10/13/2020  ? Fatigue 10/13/2020  ? Other hyperlipidemia 10/13/2020  ? OSA on CPAP 10/13/2020  ? Vitamin D deficiency 10/13/2020  ? Gastroesophageal reflux disease 10/13/2020  ? Mood disorder ([place]), with emotional eating 10/13/2020  ? At risk for heart disease 10/13/2020  ? Ingrown toenail 05/17/2020  ? Plantar fasciitis 05/17/2020  ? Social anxiety disorder   ? ? ?Past  Medical History:  ?Diagnosis Date  ? Acid reflux   ? Anxiety   ? Back pain   ? Bladder prolapse, female, acquired   ? C. difficile colitis   ? from cephalosporins  ? Chondromalacia   ? COVID 08/12/2021  ? mild symptoms  ? Dysplasia of cervix, low grade (CIN 1) 1991  ? Fibroids   ? uterine  ? Heartburn   ? High cholesterol   ? History of COVID-19 06/05/2021  ? History of prediabetes   ? last A1C on 10/04/21 was 5.6  ? HSV-1 infection 2010  ? cold sores  ? Lumbar spondylosis   ? Mild depression   ? Palpitations   ? Plantar fasciitis, bilateral   ? Simple endometrial hyperplasia without atypia 08/2021  ? Sleep apnea 04/02/2017  ? home sleep study, Pulmonologist LOV 08/23/2017 with Dr. Jeanella Cara. Patient uses cpap nightly per pt on 10/26/21.  ? SOBOE (shortness of breath on exertion) 10/13/2020  ? per MD note from [facility],  SOBOE likely related to obesity & exericise induced.  ? Social anxiety disorder 1998  ? Swallowing difficulty   ? feeling of fullness in upper chest, resolved as of 10/26/2021 per pt  ? Swelling of both lower extremities   ?  hx of left ankle swelling and knee arthritis  ? Vitamin D deficiency   ? Wears contact lenses   ? ? ?Past Surgical History:  ?Procedure Laterality Date  ? ANTERIOR (CYSTOCELE) AND POSTERIOR REPAIR (RECTOCELE) WITH XENFORM GRAFT AND SACROSPINOUS FIXATION N/A 11/06/2021  ? Procedure: ANTERIOR AND POSTERIOR COLPORRHAPHY;  Surgeon: Nunzio Cobbs, MD;  Location[facility];  Service: Gynecology;  Laterality: N/A;  ? APPENDECTOMY  1972  ? BLADDER SUSPENSION N/A 11/06/2021  ? Procedure: TRANSVAGINAL TAPE EXACT MIDURETHRAL;  Surgeon: Nunzio Cobbs, MD;  Location: [facility];  Service: Gynecology;  Laterality: N/A;  ? BREAST ENHANCEMENT SURGERY  10/08/2006  ? Saline Harlow Mares)  ? COLONOSCOPY    ? approximately 2014, had previous colonoscopy 5 years ealier  ? CYSTOSCOPY N/A 11/06/2021  ? Procedure: CYSTOSCOPY;  Surgeon:  Nunzio Cobbs, MD;  Location: [facility];  Service: Gynecology;  Laterality: N/A;  ? DG BARIUM SWALLOW ([place] HX)    ? around 2022  ? TOTAL LAPAROSCOPIC HYSTERECTOMY WITH SALPINGECTOMY Bilateral 11/06/2021  ? Procedure: TOTAL LAPAROSCOPIC HYSTERECTOMY WITH BILATERAL SALPINGOOOPHORECTOMY.;  Surgeon: Nunzio Cobbs, MD;  Location: [facility];  Service: Gynecology;  Laterality: Bilateral;  ? TUBAL LIGATION  01/1995  ? ? ?Current Outpatient Medications  ?Medication Sig Dispense Refill  ? buPROPion (WELLBUTRIN) 100 MG tablet Take 1 tablet (100 mg total) by mouth 2 (two) times daily. 60 tablet 0  ? escitalopram (LEXAPRO) 20 MG tablet Take 20 mg by mouth daily.    ? omeprazole (PRILOSEC) 20 MG capsule Take 20 mg by mouth as needed.    ? rosuvastatin (CRESTOR) 10 MG tablet SMARTSIG:1 Tablet(s) By Mouth Every Evening    ? triamcinolone cream (KENALOG) 0.1 % daily as needed.  2  ? valACYclovir (VALTREX) 500 MG tablet Take one tablet (500 mg) by mouth daily.  Take one tablet (500 mg) by mouth twice daily for an outbreak. (Patient taking differently: Take one tablet (500 mg) by mouth daily.  Take one tablet (500 mg) by mouth twice daily for an outbreak. Patient only takes as needed per pt on 10/26/21.) 100 tablet 3  ? Vitamin D, Ergocalciferol, (DRISDOL) 1.25 MG (50000 UNIT) CAPS capsule Take 1 capsule (50,000 Units total) by mouth every 7 (seven) days. 4 capsule 0  ? ?No current facility-administered medications for this visit.  ?  ? ?ALLERGIES: Iodinated contrast media and Cephalosporins ? ?Family History  ?Problem Relation Age of Onset  ? Cancer Father   ?     lung  ? Thyroid disease Father   ? Cancer Brother   ?     testicular cancer  ? Osteoarthritis Mother   ? Migraines Mother   ? Thyroid disease Mother   ? Depression Mother   ? Anxiety disorder Mother   ? Obesity Mother   ? Osteoarthritis Maternal Grandmother   ? Hypertension Paternal Grandmother   ? Stroke Paternal  Grandmother   ? ? ?Social History  ? ?Socioeconomic History  ? Marital status: Married  ?  Spouse name: R Tomi Bamberger  ? Number of children: Not on file  ? Years of education: Not on file  ? Highest education level: Not on file  ?Occupational History  ? Not on file  ?Tobacco Use  ? Smoking status: Former  ?  Years: 10.00  ?  Types: Cigarettes  ?  Quit date: 2012  ?  Years since quitting:  11.3  ? Smokeless tobacco: Never  ?Vaping Use  ? Vaping Use: Never used  ?Substance and Sexual Activity  ? Alcohol use: Yes  ?  Comment: 1-3 glasses of wine per month  ? Drug use: No  ? Sexual activity: Yes  ?  Partners: Male  ?  Birth control/protection: Surgical, Post-menopausal  ?  Comment: BTL  ?Other Topics Concern  ? Not on file  ?Social History Narrative  ? Not on file  ? ?Social Determinants of Health  ? ?Financial Resource Strain: Not on file  ?Food Insecurity: Not on file  ?Transportation Needs: Not on file  ?Physical Activity: Not on file  ?Stress: Not on file  ?Social Connections: Not on file  ?Intimate Partner Violence: Not on file  ? ? ?Review of Systems  ?All other systems reviewed and are negative. ? ?PHYSICAL EXAMINATION:   ? ?BP 120/74   Ht '5\' 3"'$  (1.6 m)   Wt 186 lb (84.4 kg)   LMP 07/08/2020 (Approximate)   BMI 32.95 kg/m?     ?General appearance: alert, cooperative and appears stated age ?  ?Abdomen: incisions intact.  Abdomen is soft, non-tender, no masses,  no organomegaly. ? ?Pelvic: External genitalia:  no lesions ?             Urethra:  normal appearing urethra with no masses, tenderness or lesions ?             Bartholins and Skenes: normal    ?             Vagina: normal appearing vagina with normal color and discharge, no lesions ?             Cervix: absent ?               ?Bimanual Exam:  Uterus:  absent ?             Adnexa: no mass, fullness, tenderness ?         ? ?Chaperone was present for exam:  Estill Bamberg, CMA ? ?ASSESSMENT ? ?Total laparoscopic hysterectomy with bilateral salpingo-oophorectomy,  lysis of adhesion of right adnexa, TVT Exact midurethral sling, cystoscopy, anterior and posterior colporrhaphy. ?Doing well overall.  ? ?PLAN ? ?Ok to resume all normal activity.  ?Control depth of penetration with

## 2022-01-29 ENCOUNTER — Ambulatory Visit (INDEPENDENT_AMBULATORY_CARE_PROVIDER_SITE_OTHER): Payer: 59 | Admitting: Obstetrics and Gynecology

## 2022-01-29 ENCOUNTER — Encounter: Payer: Self-pay | Admitting: Obstetrics and Gynecology

## 2022-01-29 ENCOUNTER — Encounter (INDEPENDENT_AMBULATORY_CARE_PROVIDER_SITE_OTHER): Payer: Self-pay | Admitting: Family Medicine

## 2022-01-29 ENCOUNTER — Ambulatory Visit (INDEPENDENT_AMBULATORY_CARE_PROVIDER_SITE_OTHER): Payer: 59 | Admitting: Family Medicine

## 2022-01-29 VITALS — BP 93/58 | HR 73 | Temp 97.8°F | Ht 63.0 in | Wt 190.0 lb

## 2022-01-29 VITALS — BP 120/74 | Ht 63.0 in | Wt 186.0 lb

## 2022-01-29 DIAGNOSIS — R7303 Prediabetes: Secondary | ICD-10-CM

## 2022-01-29 DIAGNOSIS — E559 Vitamin D deficiency, unspecified: Secondary | ICD-10-CM

## 2022-01-29 DIAGNOSIS — Z9189 Other specified personal risk factors, not elsewhere classified: Secondary | ICD-10-CM

## 2022-01-29 DIAGNOSIS — Z6831 Body mass index (BMI) 31.0-31.9, adult: Secondary | ICD-10-CM

## 2022-01-29 DIAGNOSIS — E7849 Other hyperlipidemia: Secondary | ICD-10-CM | POA: Diagnosis not present

## 2022-01-29 DIAGNOSIS — E669 Obesity, unspecified: Secondary | ICD-10-CM

## 2022-01-29 DIAGNOSIS — Z6833 Body mass index (BMI) 33.0-33.9, adult: Secondary | ICD-10-CM

## 2022-01-29 DIAGNOSIS — Z9889 Other specified postprocedural states: Secondary | ICD-10-CM

## 2022-01-31 ENCOUNTER — Other Ambulatory Visit (INDEPENDENT_AMBULATORY_CARE_PROVIDER_SITE_OTHER): Payer: Self-pay | Admitting: Family Medicine

## 2022-01-31 DIAGNOSIS — F39 Unspecified mood [affective] disorder: Secondary | ICD-10-CM

## 2022-02-08 NOTE — Progress Notes (Signed)
? ? ? ?Chief Complaint:  ? ?OBESITY ?Lisa Wade is here to discuss her progress with her obesity treatment plan along with follow-up of her obesity related diagnoses. Lisa Wade is on the Category 2 Plan with breakfast options and keeping a food journal and adhering to recommended goals of 1500 to 1600 calories and 100 grams of protein and states she is following her eating plan approximately 50% of the time. Lisa Wade states she is walking 30 minutes 3 times per week. ? ?Today's visit was #: 21 ?Starting weight: 185 lbs ?Starting date: 10/13/2020 ?Today's weight: 190 lbs ?Today's date: 01/29/2022 ?Total lbs lost to date: 0 ?Total lbs lost since last in-office visit: 0 ? ?Interim History: Lisa Wade is skipping meals. She logged 4/6 to 4/11 and then gave up, as she found that she's eating too many calories and not enough protein. Lisa Wade is getting 30 to 50 grams of protein daily and 1100 to 1300 calories per day. ? ?Subjective:  ? ?1. Prediabetes ?Lisa Wade's fasting Insulin and A1c are stable and is slightly improved. She denies hunger, skips meals, and then grabs snacks. ? ?2. Vitamin D deficiency ?Lisa Wade keeps forgetting to take her vitamin D weekly. She is on Ergocalciferol weekly. ? ?3. Other hyperlipidemia ?Lisa Wade takes her medications about 85% of the time. She is on Crestor '10mg'$  qhs. Lisa Wade tolerates the medication well and is managed by her PCP. ? ?4. At risk for dehydration ?Lisa Wade is at risk for dehydration due to only drinking 40 ounces of water per day. ? ?Assessment/Plan:  ?No orders of the defined types were placed in this encounter. ? ? ?There are no discontinued medications.  ? ?No orders of the defined types were placed in this encounter. ?  ? ?1. Prediabetes ?Lisa Wade will continue to work on weight loss, exercise, and decreasing simple carbohydrates to help decrease the risk of diabetes.  ? ?2. Vitamin D deficiency ?Lisa Wade is suboptimally controlled. Compliance was discussed with Lisa Wade and she agrees to take her vitamin D every Sunday. ? ?3.  Other hyperlipidemia ?Lisa Wade is at goal, but agrees to take her medications every night and will increase activity as tolerated. She agrees to continue her PNP. ? ?4. At risk for dehydration ?Lisa Wade was given approximately 7 minutes of dehydration prevention counseling today. Lisa Wade is at risk for dehydration due to weight loss and current medication(s). She was encouraged to hydrate and monitor fluid status to avoid dehydration as weight loss plateaus.  ? ?5. Obesity with current BMI of 33.8 ?Lisa Wade agrees to use Category 3 snacks as her guide to get to 1500 to 1600 calories daily, but with over 100+ grams of protein per day. ? ?Lisa Wade is currently in the action stage of change. As such, her goal is to continue with weight loss efforts. She has agreed to keeping a food journal and adhering to recommended goals of 1500 to 1600 calories and 100+ grams of protein.  ? ?Exercise goals:  As is. ? ?Behavioral modification strategies: no skipping meals, meal planning and cooking strategies, and avoiding temptations. ? ?Lisa Wade has agreed to follow-up with our clinic in 4 weeks. She was informed of the importance of frequent follow-up visits to maximize her success with intensive lifestyle modifications for her multiple health conditions.  ? ?Objective:  ? ?Blood pressure (!) 93/58, pulse 73, temperature 97.8 ?F (36.6 ?C), height '5\' 3"'$  (1.6 m), weight 190 lb (86.2 kg), last menstrual period 07/08/2020, SpO2 97 %. ?Body mass index is 33.66 kg/m?. ? ?General: Cooperative, alert, well developed,  in no acute distress. ?HEENT: Conjunctivae and lids unremarkable. ?Cardiovascular: Regular rhythm.  ?Lungs: Normal work of breathing. ?Neurologic: No focal deficits.  ? ?Lab Results  ?Component Value Date  ? CREATININE 0.83 01/01/2022  ? BUN 13 01/01/2022  ? NA 143 01/01/2022  ? K 4.8 01/01/2022  ? CL 107 (H) 01/01/2022  ? CO2 21 01/01/2022  ? ?Lab Results  ?Component Value Date  ? ALT 26 01/01/2022  ? AST 20 01/01/2022  ? ALKPHOS 84 01/01/2022  ?  BILITOT 0.4 01/01/2022  ? ?Lab Results  ?Component Value Date  ? HGBA1C 5.5 01/01/2022  ? HGBA1C 5.6 10/04/2021  ? HGBA1C 5.7 (H) 02/06/2021  ? HGBA1C 5.8 (H) 10/13/2020  ? ?Lab Results  ?Component Value Date  ? INSULIN 10.6 01/01/2022  ? INSULIN 11.0 02/06/2021  ? INSULIN 11.4 10/13/2020  ? ?Lab Results  ?Component Value Date  ? TSH 1.070 10/13/2020  ? ?Lab Results  ?Component Value Date  ? CHOL 156 01/01/2022  ? HDL 58 01/01/2022  ? [place] 73 01/01/2022  ? TRIG 145 01/01/2022  ? CHOLHDL 2.7 01/01/2022  ? ?Lab Results  ?Component Value Date  ? VD25OH 36.0 01/01/2022  ? VD25OH 37.1 02/06/2021  ? VD25OH 13.0 (L) 10/13/2020  ? ?Lab Results  ?Component Value Date  ? WBC 14.2 (H) 11/07/2021  ? HGB 11.3 (L) 11/07/2021  ? HCT 34.4 (L) 11/07/2021  ? MCV 88.0 11/07/2021  ? PLT 234 11/07/2021  ? ?No results found for: IRON, TIBC, FERRITIN ? ?Attestation Statements:  ? ?Reviewed by clinician on day of visit: allergies, medications, problem list, medical history, surgical history, family history, social history, and previous encounter notes. ? ?I, Marcille Blanco, CMA, am acting as transcriptionist for [employer], DO ? ?I have reviewed the above documentation for accuracy and completeness, and I agree with the above. Marjory Sneddon, D.O. ? ?The [place] was signed into law in 2016 which includes the topic of electronic health records.  This provides immediate access to information in MyChart.  This includes consultation notes, operative notes, office notes, lab results and pathology reports.  If you have any questions about what you read please let us know at your next visit so we can discuss your concerns and take corrective action if need be.  We are right here with you. ? ?

## 2022-02-26 ENCOUNTER — Ambulatory Visit (INDEPENDENT_AMBULATORY_CARE_PROVIDER_SITE_OTHER): Payer: 59 | Admitting: Family Medicine

## 2022-03-12 ENCOUNTER — Ambulatory Visit
Admission: RE | Admit: 2022-03-12 | Discharge: 2022-03-12 | Disposition: A | Discharge status: Home / Self Care | Payer: 59 | Source: Ambulatory Visit | Attending: Family Medicine | Admitting: Family Medicine

## 2022-03-12 ENCOUNTER — Other Ambulatory Visit: Payer: Self-pay | Admitting: Family Medicine

## 2022-03-12 DIAGNOSIS — R52 Pain, unspecified: Secondary | ICD-10-CM

## 2022-03-16 ENCOUNTER — Other Ambulatory Visit: Payer: Self-pay | Admitting: Family Medicine

## 2022-03-16 DIAGNOSIS — M5416 Radiculopathy, lumbar region: Secondary | ICD-10-CM

## 2022-03-16 DIAGNOSIS — M79651 Pain in right thigh: Secondary | ICD-10-CM

## 2022-03-26 ENCOUNTER — Ambulatory Visit
Admission: RE | Admit: 2022-03-26 | Discharge: 2022-03-26 | Disposition: A | Discharge status: Home / Self Care | Payer: 59 | Source: Ambulatory Visit | Attending: Family Medicine | Admitting: Family Medicine

## 2022-03-26 DIAGNOSIS — M79651 Pain in right thigh: Secondary | ICD-10-CM

## 2022-03-26 DIAGNOSIS — M5416 Radiculopathy, lumbar region: Secondary | ICD-10-CM

## 2022-03-28 ENCOUNTER — Ambulatory Visit (INDEPENDENT_AMBULATORY_CARE_PROVIDER_SITE_OTHER): Payer: 59

## 2022-03-28 ENCOUNTER — Ambulatory Visit: Payer: Self-pay

## 2022-03-28 ENCOUNTER — Ambulatory Visit: Payer: 59 | Admitting: Orthopaedic Surgery

## 2022-03-28 DIAGNOSIS — M25562 Pain in left knee: Secondary | ICD-10-CM

## 2022-03-28 DIAGNOSIS — M25561 Pain in right knee: Secondary | ICD-10-CM

## 2022-03-28 DIAGNOSIS — G8929 Other chronic pain: Secondary | ICD-10-CM

## 2022-03-28 NOTE — Progress Notes (Signed)
Office Visit Note   [name]           Date of Birth: [birth year]           MRN: [id number] Visit Date: 03/28/2022              Requested by: Derinda Late, MD [id number] [place],  [place] [address] PCP: Derinda Late, MD   Assessment & Plan: Visit Diagnoses:  1. Chronic pain of left knee   2. Chronic pain of right knee     Plan: She did ask me to go over her MRI report of her lumbar spine which is shows some mild arthritic changes and no foraminal or central stenosis.  There is only slight disc bulging.  I did go over the x-rays of her knees as well.  I have recommended outpatient physical therapy with Barbaraann Barthel to work on any modalities that can decrease her back pain as well as strength in her knees and ankles.  I gave her reassurance that she looks great for age and that therapy could help her greatly.  All questions and concerns were answered addressed.  Follow-up is as needed.  Follow-Up Instructions: Return if symptoms worsen or fail to improve.   Orders:  Orders Placed This Encounter  Procedures   XR Knee 1-2 Views Right   XR Knee 1-2 Views Left   No orders of the defined types were placed in this encounter.     Procedures: No procedures performed   Clinical Data: No additional findings.   Subjective: Chief Complaint  Patient presents with   Left Knee - Pain   Right Knee - Pain  The patient is a 61 year old female who used to be an avid skier who comes in with chronic pain of both her knees.  She is active but says that going up and down stairs and up and down hills is becoming more problematic in terms of the pain that she gets in her knees and the weakness in her knees.  She has recently had an MRI of her lumbar spine yesterday due to low back pain and right-sided low back pain but no radicular symptoms today.  She also complains of instability of her ankles.  Again she is never had any injuries to these areas and used to be an avid  ski year.  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  She does not take any significant medications in terms of arthritis medications or pain medications.  She has never had knee surgery or back surgery.  She does state his arthritis runs in her family and her mother had knee replacements.  HPI  Review of Systems There is currently listed no fever, chills, nausea, vomiting  Objective: Vital Signs: LMP 07/08/2020 (Approximate)   Physical Exam She is alert and orient x3 and in no acute distress Ortho Exam Examination of both knees show that they slightly hyperextend but the motion is great.  Both knees are ligamentously stable with a negative Lachman's and negative McMurray's exam with either knee.  There is no effusion with either knee.  Her pain is the patellofemoral joint bilaterally.  There is some slight patellofemoral crepitation.  Both ankles seem to roll and slightly with some incompetence of the posterior tibial tendon bilaterally but no fallen arches in her feet or not flat. Specialty Comments:  No specialty comments available.  Imaging: XR Knee 1-2 Views Right  Result Date: 03/28/2022 2  views of the right knee show well-maintained medial lateral compartments with patellofemoral narrowing and arthritic changes at the patellofemoral joint.  The alignment is neutral.  XR Knee 1-2 Views Left  Result Date: 03/28/2022 2 views of the left knee show well-maintained medial lateral compartments and neutral alignment.  There is patellofemoral narrowing and arthritic changes at the patellofemoral joint.    PMFS History: Patient Active Problem List   Diagnosis Date Noted   Upper airway cough syndrome 01/01/2022   Status post laparoscopic hysterectomy 11/06/2021   At risk for depression 03/15/2021   At risk for deficient intake of food 12/19/2020   Prediabetes 12/19/2020   At risk for malnutrition 11/09/2020   SOBOE (shortness of breath on exertion)  10/13/2020   Fatigue 10/13/2020   Other hyperlipidemia 10/13/2020   OSA on CPAP 10/13/2020   Vitamin D deficiency 10/13/2020   Gastroesophageal reflux disease 10/13/2020   Mood disorder ([place]), with emotional eating 10/13/2020   At risk for heart disease 10/13/2020   Ingrown toenail 05/17/2020   Plantar fasciitis 05/17/2020   Social anxiety disorder    Past Medical History:  Diagnosis Date   Acid reflux    Anxiety    Back pain    Bladder prolapse, female, acquired    C. 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 Jeanella Cara. Patient uses cpap nightly per pt on 10/26/21.   SOBOE (shortness of breath on exertion) 10/13/2020   per MD note from [facility],  SOBOE likely related to obesity & exericise induced.   Social anxiety disorder 1998   Swallowing difficulty    feeling of fullness in upper chest, resolved as of 10/26/2021 per pt   Swelling of both lower extremities    hx of left ankle swelling and knee arthritis   Vitamin D deficiency    Wears contact lenses     Family History  Problem Relation Age of Onset   Cancer Father        lung   Thyroid disease Father    Cancer Brother        testicular cancer   Osteoarthritis Mother    Migraines Mother    Thyroid disease Mother    Depression Mother    Anxiety disorder Mother    Obesity Mother    Osteoarthritis Maternal Grandmother    Hypertension Paternal Grandmother    Stroke Paternal Grandmother      Past Surgical History:  Procedure Laterality Date   ANTERIOR (CYSTOCELE) AND POSTERIOR REPAIR (RECTOCELE) WITH XENFORM GRAFT AND SACROSPINOUS FIXATION N/A 11/06/2021   Procedure: ANTERIOR AND POSTERIOR COLPORRHAPHY;  Surgeon: Nunzio Cobbs, MD;  Location: [facility];  Service: Gynecology;  Laterality: N/A;   APPENDECTOMY  1972   BLADDER SUSPENSION N/A 11/06/2021   Procedure: TRANSVAGINAL TAPE EXACT MIDURETHRAL;  Surgeon: Yisroel Ramming, Everardo All,  MD;  Location: [facility];  Service: Gynecology;  Laterality: N/A;   BREAST ENHANCEMENT SURGERY  10/08/2006   [facility] Harlow Mares)   COLONOSCOPY     approximately 2014, had previous colonoscopy 5 years ealier   CYSTOSCOPY N/A 11/06/2021   Procedure: CYSTOSCOPY;  Surgeon: Nunzio Cobbs, MD;  Location: [facility];  Service: Gynecology;  Laterality: N/A;   DG BARIUM SWALLOW ([place] HX)     around 2022   TOTAL LAPAROSCOPIC HYSTERECTOMY WITH SALPINGECTOMY Bilateral 11/06/2021   Procedure: TOTAL LAPAROSCOPIC HYSTERECTOMY WITH BILATERAL SALPINGOOOPHORECTOMY.;  Surgeon: Nunzio Cobbs, MD;  Location: [facility];  Service: Gynecology;  Laterality: Bilateral;   TUBAL LIGATION  01/1995   Social History   Occupational History   Not on file  Tobacco Use   Smoking status: Former    Years: 10.00    Types: Cigarettes    Quit date: 2012    Years since quitting: 11.4   Smokeless tobacco: Never  Vaping Use   Vaping Use: Never used  Substance and Sexual Activity   Alcohol use: Yes    Comment: 1-3 glasses of wine per month   Drug use: No   Sexual activity: Yes    Partners: Male    Birth control/protection: Surgical, Post-menopausal    Comment: BTL

## 2022-05-16 ENCOUNTER — Encounter (INDEPENDENT_AMBULATORY_CARE_PROVIDER_SITE_OTHER): Payer: Self-pay

## 2022-06-19 NOTE — Progress Notes (Signed)
61 y.o. G53P0000 Married Caucasian female here for annual exam.    Doing well following surgery.  Denies urinary incontinence.   Still has some back pain that occurs.  Had an MRI.  States she has arthritis.   Wants to loose weight.  Went to [facility] for a while.  No hot flashes or night sweats.   PCP: Derinda Late, MD    Patient's last menstrual period was 07/08/2020 (approximate).           Sexually active: Yes.    The current method of family planning is tubal ligation/Hyst.    Exercising: No.  The patient does not participate in regular exercise at present. Smoker:  no  Health Maintenance: Pap:  06-15-21 Neg:Neg HR HPV, 03-28-16 Neg:neg HR HPV, 02-04-14 Neg:Neg HR HPV History of abnormal Pap:  yes,  1991 Hx CIN 1 MMG:  08-21-21 Neg/Birads1.  Has appointment.  Colonoscopy:   2015 normal;next due 2025 BMD:  n/a  Result  n/a TDaP:  PCP Gardasil:   no HIV: 03-28-16 NR Hep C: 03-28-16 Neg Screening Labs:  PCP   reports that she quit smoking about 11 years ago. Her smoking use included cigarettes. She has never used smokeless tobacco. She reports current alcohol use. She reports that she does not use drugs.  Past Medical History:  Diagnosis Date   Acid reflux    Anxiety    Back pain    Bladder prolapse, female, acquired    C. 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 Jeanella Cara. Patient uses cpap nightly per pt on 10/26/21.   SOBOE (shortness of  breath on exertion) 10/13/2020   per MD note from Dr[facility],  SOBOE likely related to obesity & exericise induced.   Social anxiety disorder 1998   Swallowing difficulty    feeling of fullness in upper chest, resolved as of 10/26/2021 per pt   Swelling of both lower extremities    hx of left ankle swelling and knee arthritis   Vitamin D deficiency    Wears contact lenses     Past Surgical History:  Procedure Laterality Date   ANTERIOR (CYSTOCELE) AND POSTERIOR REPAIR (RECTOCELE) WITH XENFORM GRAFT AND SACROSPINOUS FIXATION N/A 11/06/2021   Procedure: ANTERIOR AND POSTERIOR COLPORRHAPHY;  Surgeon: Nunzio Cobbs, MD;  Location: [facility];  Service: Gynecology;  Laterality: N/A;   APPENDECTOMY  1972   BLADDER SUSPENSION N/A 11/06/2021   Procedure: TRANSVAGINAL TAPE EXACT MIDURETHRAL;  Surgeon: Nunzio Cobbs, MD;  Location: [facility];  Service: Gynecology;  Laterality: N/A;   BREAST ENHANCEMENT SURGERY  10/08/2006   Saline Harlow Mares)   COLONOSCOPY     approximately 2014, had previous colonoscopy 5 years ealier   CYSTOSCOPY N/A 11/06/2021   Procedure: CYSTOSCOPY;  Surgeon: Nunzio Cobbs, MD;  Location: [facility];  Service: Gynecology;  Laterality: N/A;   DG BARIUM SWALLOW ([place] HX)     around 2022   TOTAL LAPAROSCOPIC HYSTERECTOMY WITH SALPINGECTOMY Bilateral 11/06/2021   Procedure: TOTAL LAPAROSCOPIC HYSTERECTOMY WITH BILATERAL SALPINGOOOPHORECTOMY.;  Surgeon: Nunzio Cobbs, MD;  Location: [facility];  Service: Gynecology;  Laterality: Bilateral;   TUBAL LIGATION  01/1995    Current Outpatient Medications  Medication Sig Dispense Refill   albuterol (VENTOLIN HFA) 108 (90 Base) MCG/ACT inhaler SMARTSIG:1-2 Puff(s) Via Inhaler Every 4 Hours PRN     escitalopram (LEXAPRO) 20 MG tablet Take 20 mg by mouth daily.     omeprazole (PRILOSEC) 20 MG capsule Take 20 mg by  mouth as needed.     oxyCODONE (OXY IR/ROXICODONE) 5 MG immediate release tablet Take 5 mg by mouth every 4 (four) hours as needed.     rosuvastatin (CRESTOR) 10 MG tablet SMARTSIG:1 Tablet(s) By Mouth Every Evening     triamcinolone cream (KENALOG) 0.1 % daily as needed.  2   valACYclovir (VALTREX) 500 MG tablet Take one tablet (500 mg) by mouth daily.  Take one tablet (500 mg) by mouth twice daily for an outbreak. (Patient taking differently: Take one tablet (500 mg) by mouth daily.  Take one tablet (500 mg) by mouth twice daily for an outbreak. Patient only takes as needed per pt on 10/26/21.) 100 tablet 3   Vitamin D, Ergocalciferol, (DRISDOL) 1.25 MG (50000 UNIT) CAPS capsule Take 1 capsule (50,000 Units total) by mouth every 7 (seven) days. 4 capsule 0   No current facility-administered medications for this visit.    Family History  Problem Relation Age of Onset   Cancer Father        lung   Thyroid disease Father    Cancer Brother        testicular cancer   Osteoarthritis Mother    Migraines Mother    Thyroid disease Mother    Depression Mother    Anxiety disorder Mother    Obesity Mother    Osteoarthritis Maternal Grandmother    Hypertension Paternal Grandmother    Stroke Paternal Grandmother     Review of Systems  Constitutional:  Positive for unexpected weight change (weight gain).  All other systems reviewed and are negative.   Exam:   BP (!) 110/58   Pulse 74   Ht '5\' 3"'$  (1.6 m)   Wt 188 lb (85.3 kg)   LMP 07/08/2020 (Approximate)   SpO2 96%   BMI 33.30 kg/m     General appearance: alert, cooperative and appears stated age Head: normocephalic, without obvious abnormality, atraumatic Neck: no adenopathy, supple, symmetrical, trachea midline and thyroid normal to inspection and palpation Lungs: clear to auscultation bilaterally Breasts: normal appearance, consistent with bilateral implants, no masses or tenderness, No nipple retraction or dimpling, No nipple  discharge or bleeding, No axillary adenopathy Heart: regular rate and rhythm Abdomen: soft, non-tender; no masses, no organomegaly Extremities: extremities normal, atraumatic, no cyanosis or edema Skin: skin color, texture, turgor normal. No rashes or lesions Lymph nodes: cervical, supraclavicular, and axillary nodes normal. Neurologic: grossly normal  Pelvic: External genitalia:  no lesions              No abnormal inguinal nodes palpated.              Urethra:  normal appearing urethra with no masses, tenderness or lesions  Bartholins and Skenes: normal                 Vagina: normal appearing vagina with normal color and discharge, no lesions              Cervix: absent              Pap taken: no Bimanual Exam:  Uterus:  absent              Adnexa: no mass, fullness, tenderness              Rectal exam: yes.  Confirms.              Anus:  normal sphincter tone, no lesions  Chaperone was present for exam:  Estill Bamberg, CMA  Assessment:   Well woman visit with gynecologic exam. Status post total laparoscopic hysterectomy with bilateral salpingo-oophorectomy, lysis of adhesion of right adnexa, TVT Exact midurethral sling, cystoscopy, anterior and posterior colporrhaphy. Hx HSV I.  Status post bilateral breast implants.  Difficulty with loosing weight.  Plan: Mammogram screening discussed. Self breast awareness reviewed. Pap and HR HPV as above. Guidelines for Calcium, Vitamin D, regular exercise program including cardiovascular and weight bearing exercise. We discussed increased physical activity and decreasing calories for weight loss.  Labs with PCP.  Follow up annually and prn.   After visit summary provided.

## 2022-06-21 ENCOUNTER — Encounter: Payer: Self-pay | Admitting: Obstetrics and Gynecology

## 2022-06-21 ENCOUNTER — Ambulatory Visit (INDEPENDENT_AMBULATORY_CARE_PROVIDER_SITE_OTHER): Payer: 59 | Admitting: Obstetrics and Gynecology

## 2022-06-21 VITALS — BP 110/58 | HR 74 | Ht 63.0 in | Wt 188.0 lb

## 2022-06-21 DIAGNOSIS — Z01419 Encounter for gynecological examination (general) (routine) without abnormal findings: Secondary | ICD-10-CM

## 2022-06-21 MED ORDER — VALACYCLOVIR HCL 500 MG PO TABS: ORAL_TABLET | ORAL | 2 refills | Status: AC

## 2022-06-21 NOTE — Patient Instructions (Signed)

## 2022-07-10 ENCOUNTER — Other Ambulatory Visit: Payer: Self-pay | Admitting: Family Medicine

## 2022-07-10 DIAGNOSIS — G8929 Other chronic pain: Secondary | ICD-10-CM

## 2022-07-16 ENCOUNTER — Ambulatory Visit
Admission: RE | Admit: 2022-07-16 | Discharge: 2022-07-16 | Disposition: A | Discharge status: Home / Self Care | Payer: 59 | Source: Ambulatory Visit | Attending: Family Medicine | Admitting: Family Medicine

## 2022-07-16 DIAGNOSIS — M545 Low back pain, unspecified: Secondary | ICD-10-CM

## 2022-08-27 ENCOUNTER — Ambulatory Visit: Payer: 59

## 2022-08-27 ENCOUNTER — Ambulatory Visit
Admission: RE | Admit: 2022-08-27 | Discharge: 2022-08-27 | Disposition: A | Discharge status: Home / Self Care | Payer: 59 | Source: Ambulatory Visit | Attending: Family Medicine | Admitting: Family Medicine

## 2022-08-27 DIAGNOSIS — E2839 Other primary ovarian failure: Secondary | ICD-10-CM

## 2022-10-08 IMAGING — CR DG CHEST 2V
2 series · 2 of 2 positions shown · non-contrast
Comparison: 07/23/2011

CLINICAL DATA: Chest pain

EXAM:
CHEST - 2 VIEW

[w chest pa]
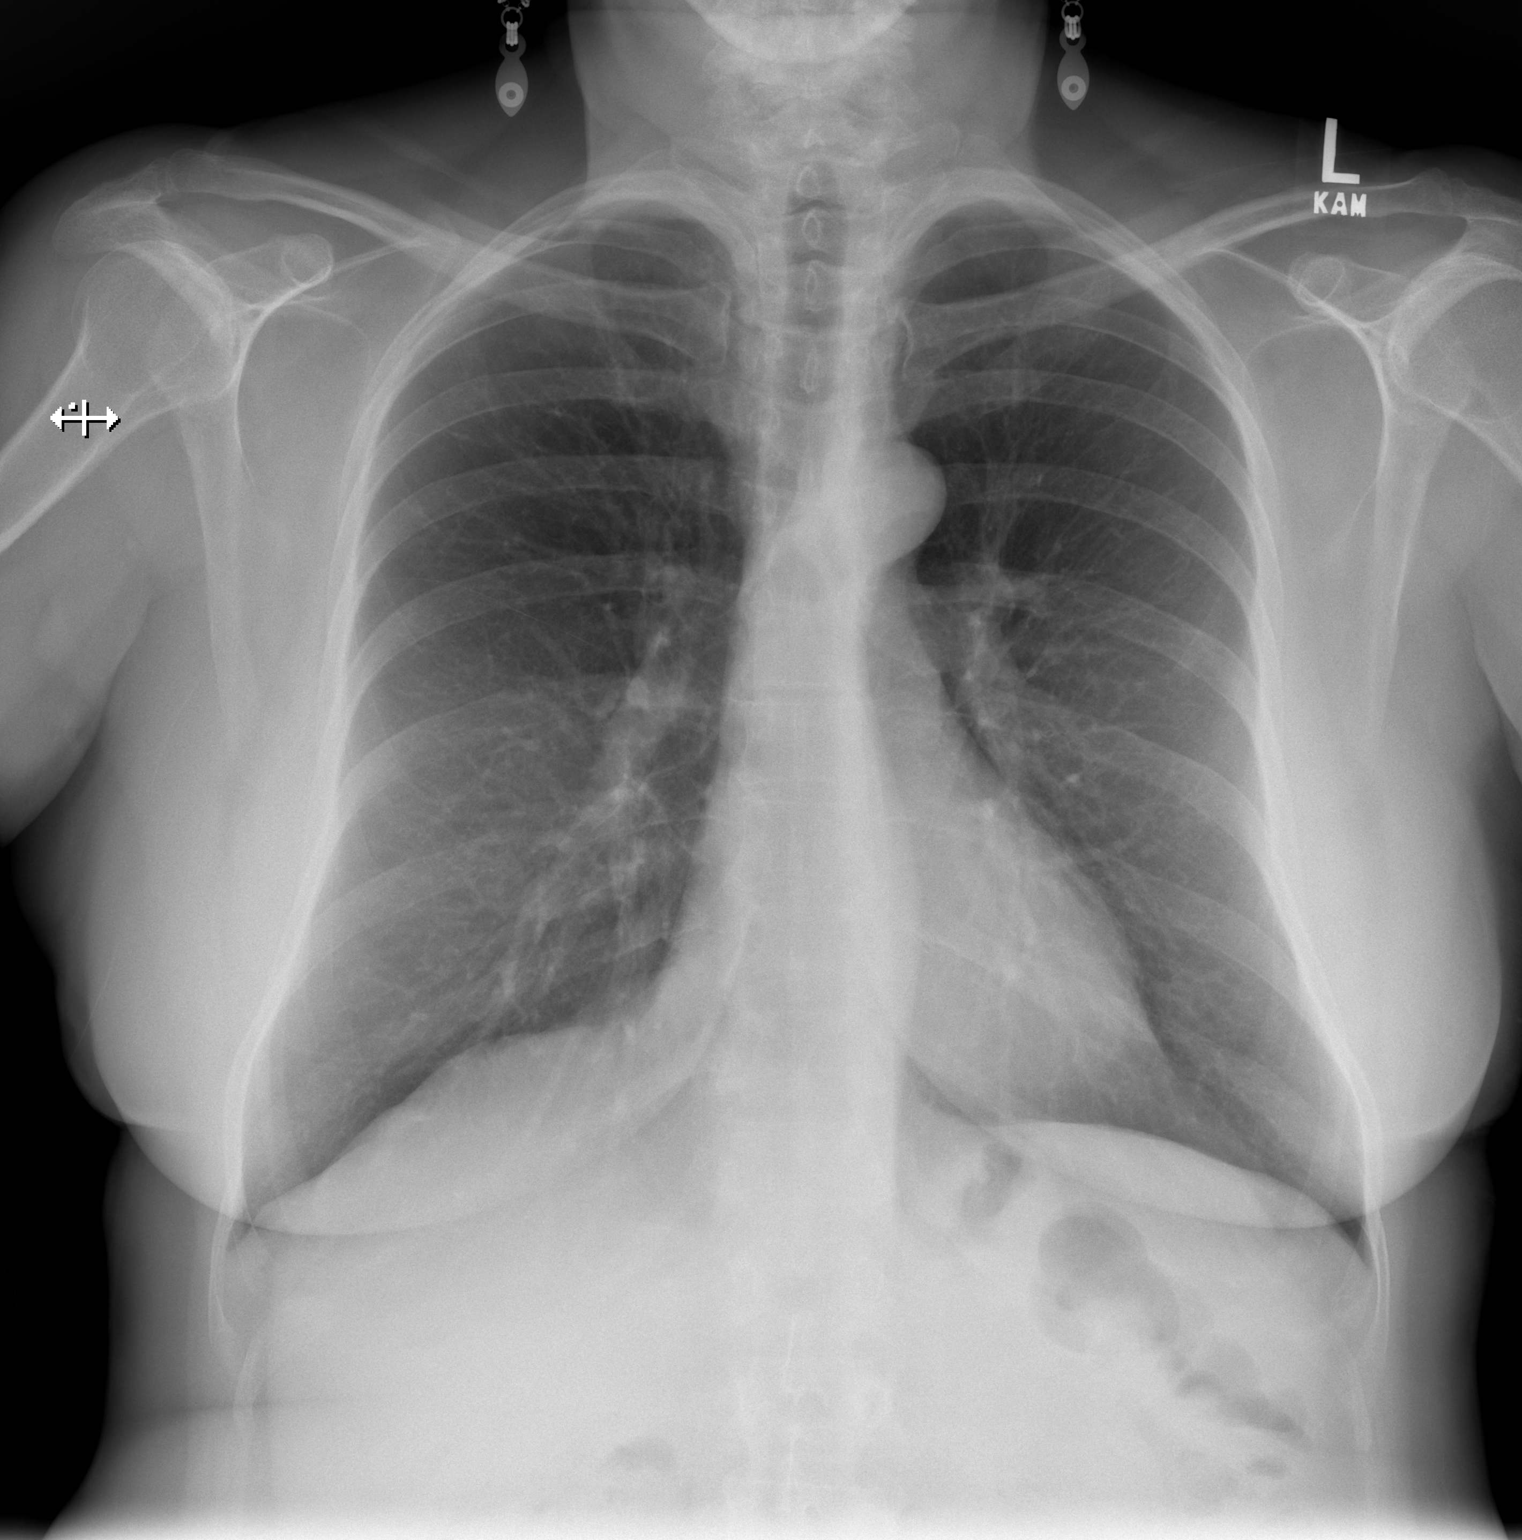

[w chest lat]
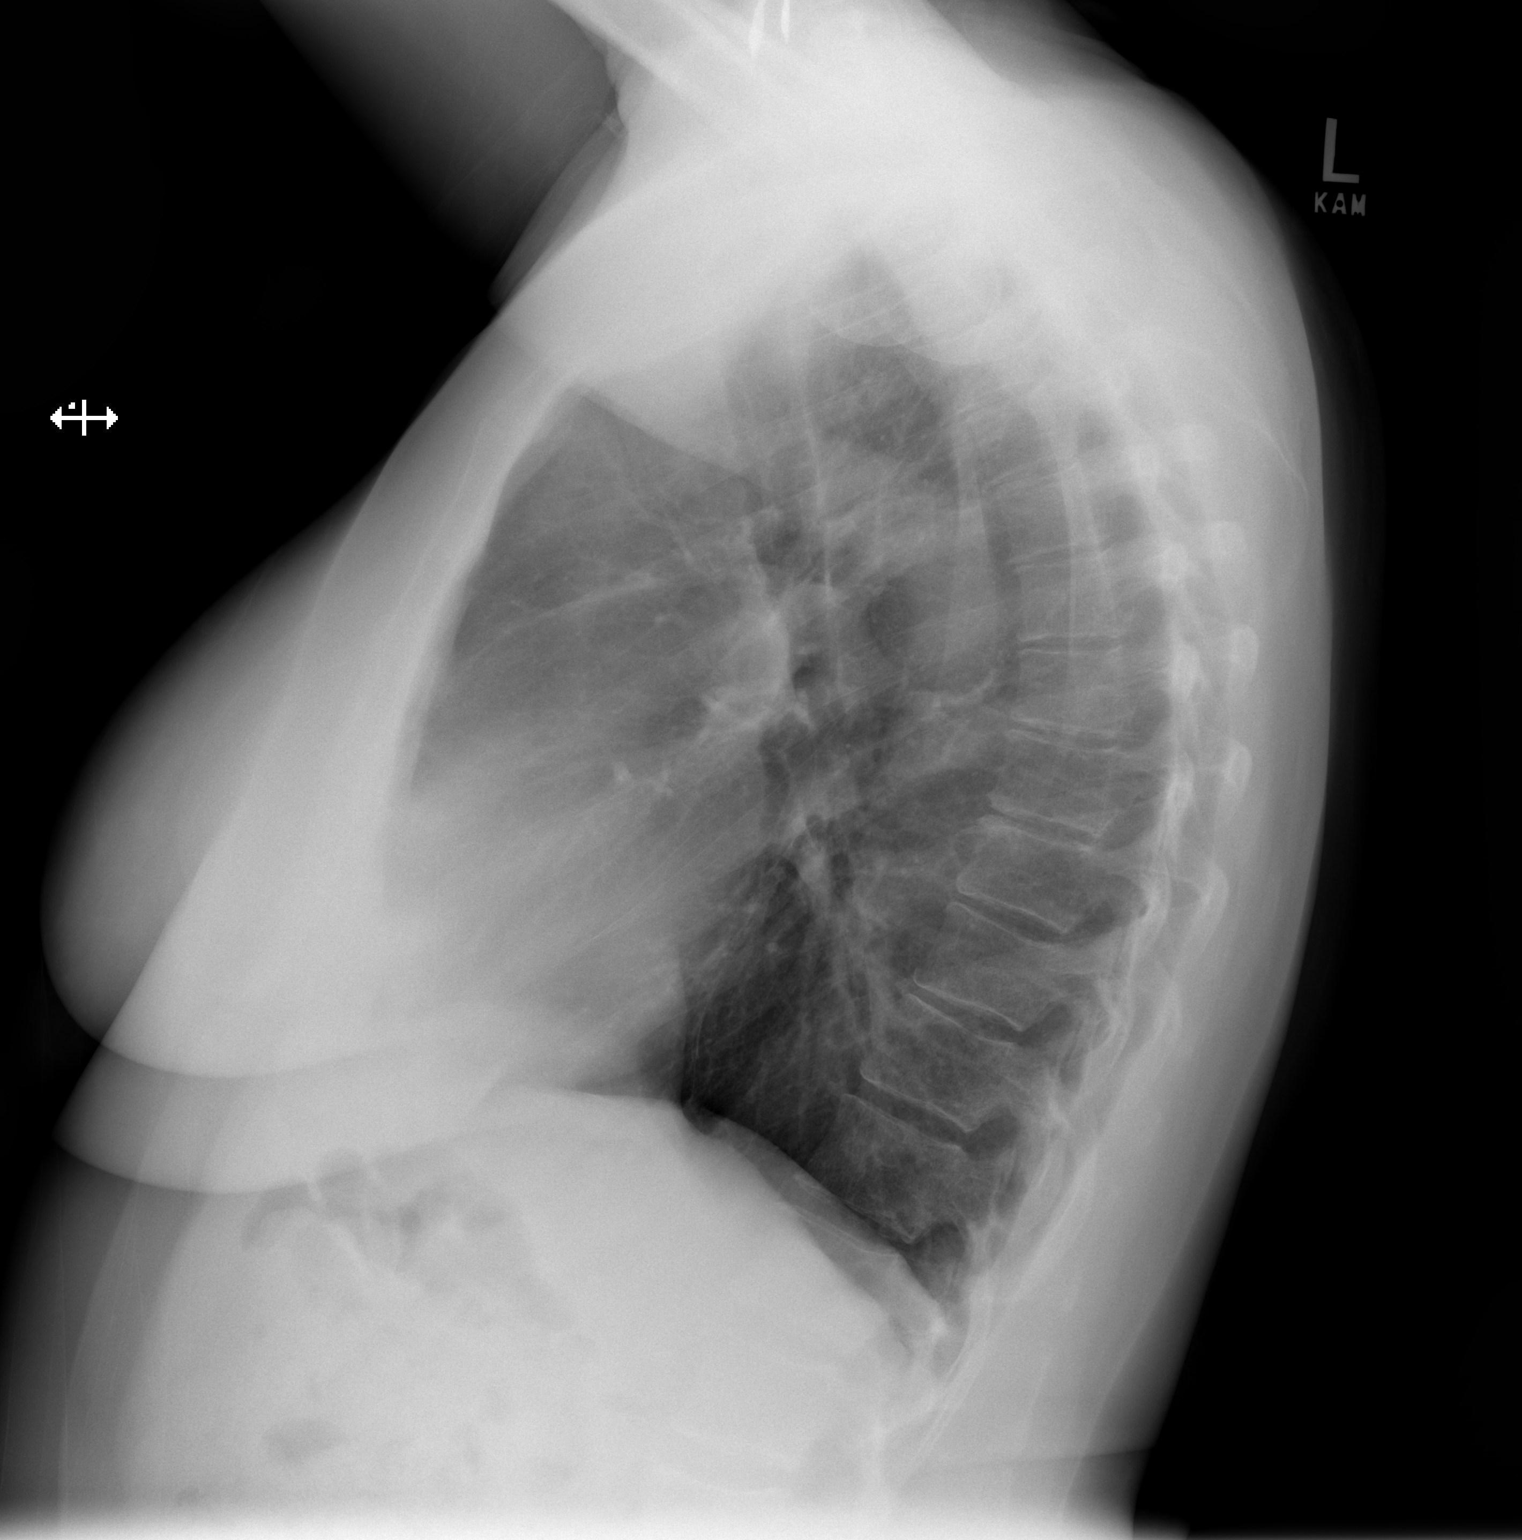

[2 of 2 positions shown; findings below may reference images not displayed]

FINDINGS: The heart size and mediastinal contours are within normal limits.
Both lungs are clear. The visualized skeletal structures are
unremarkable.
IMPRESSION: No active cardiopulmonary disease.

## 2022-12-31 ENCOUNTER — Other Ambulatory Visit: Payer: Self-pay | Admitting: Family Medicine

## 2022-12-31 ENCOUNTER — Ambulatory Visit
Admission: RE | Admit: 2022-12-31 | Discharge: 2022-12-31 | Disposition: A | Discharge status: Home / Self Care | Payer: 59 | Source: Ambulatory Visit | Attending: Family Medicine | Admitting: Family Medicine

## 2022-12-31 DIAGNOSIS — M25551 Pain in right hip: Secondary | ICD-10-CM

## 2023-01-25 ENCOUNTER — Other Ambulatory Visit (HOSPITAL_COMMUNITY): Payer: Self-pay | Admitting: Family Medicine

## 2023-01-25 DIAGNOSIS — R0989 Other specified symptoms and signs involving the circulatory and respiratory systems: Secondary | ICD-10-CM

## 2023-01-28 ENCOUNTER — Ambulatory Visit (HOSPITAL_COMMUNITY)
Admission: RE | Admit: 2023-01-28 | Discharge: 2023-01-28 | Disposition: A | Discharge status: Home / Self Care | Payer: 59 | Source: Ambulatory Visit | Attending: Vascular Surgery | Admitting: Vascular Surgery

## 2023-01-28 DIAGNOSIS — R0989 Other specified symptoms and signs involving the circulatory and respiratory systems: Secondary | ICD-10-CM | POA: Diagnosis not present

## 2023-02-11 ENCOUNTER — Ambulatory Visit (HOSPITAL_BASED_OUTPATIENT_CLINIC_OR_DEPARTMENT_OTHER): Payer: 59 | Admitting: Pulmonary Disease

## 2023-02-11 ENCOUNTER — Encounter (HOSPITAL_BASED_OUTPATIENT_CLINIC_OR_DEPARTMENT_OTHER): Payer: Self-pay | Admitting: Pulmonary Disease

## 2023-02-11 VITALS — BP 104/68 | HR 74 | Temp 98.5°F | Ht 64.0 in | Wt 189.4 lb

## 2023-02-11 DIAGNOSIS — G4733 Obstructive sleep apnea (adult) (pediatric): Secondary | ICD-10-CM | POA: Diagnosis not present

## 2023-02-11 DIAGNOSIS — R002 Palpitations: Secondary | ICD-10-CM

## 2023-02-11 NOTE — Assessment & Plan Note (Signed)
CPAP download will be reviewed.  She seems to be on auto settings 5 to 15 cm, we will tweak settings as needed. We discussed alternative mask and she is willing to trial nasal cradle mask  Weight loss encouraged, compliance with goal of at least 4-6 hrs every night is the expectation. Advised against medications with sedative side effects Cautioned against driving when sleepy - understanding that sleepiness will vary on a day to day basis

## 2023-02-11 NOTE — Patient Instructions (Signed)
X CPAP download from adapt  Trial of nasal cradle mask  X Heart monitor for 7 days ([employer]io) will be mailed to you

## 2023-02-11 NOTE — Progress Notes (Signed)
Subjective:    Patient ID: Lisa Wade, female    DOB: 1960/11/28, 62 y.o.   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  Her husband now has OSA and and noted snoring and witnessed apneas.  OSA was diagnosed in 2018 and she was placed on CPAP with good improvement in her daytime somnolence and fatigue.  She reports good compliance every night and I am able to confirm this.  Looking at the report on her phone.  She settled down with a fullface mask but very recently has switched to nasal pillows and is liking this better.  She has an AirSense 10 machine on auto settings Bedtime is between 10 PM and midnight, she sleeps on her side with 1 pillow, reports 1-2 nocturnal awakenings and is out of bed at 7:30 AM feeling rested. Epworth Sleepiness Scale is 12 and she reports some sleepiness while sitting and reading or watching TV There is no history suggestive of cataplexy, sleep paralysis or parasomnias  She reports fluttering in her chest with mild shortness of breath, frequency can vary up to 3 times per day and occurred twice last weekend.  She does report significant anxiety and is not sure if this could be related to panic attacks. She drinks 2-3 shots of espresso every day.  PMH :  h/o hemoptysis x 10 days p intubation 11/06/21 for hysterectomy    Significant tests/ events reviewed  HST 04/2017 AHI20/h, low sat 88%  Past Medical History:  Diagnosis Date   Acid reflux    Anxiety    Back pain    Bladder prolapse, female, acquired    C. difficile colitis    from cephalosporins   Chondromalacia    COVID 08/12/2021   mild symptoms   Dysplasia of cervix, low grade (CIN 1) 1991   Fibroids    uterine   Heartburn    High cholesterol    History of COVID-19 06/05/2021   History of prediabetes    last A1C on  10/04/21 was 5.6   HSV-1 infection 2010   cold sores   Lumbar spondylosis    Mild depression    Palpitations    Plantar fasciitis, bilateral    Simple endometrial hyperplasia without atypia 08/2021   Sleep apnea 04/02/2017   home sleep study, Pulmonologist LOV 08/23/2017 with Dr. Lenard Forth. Patient uses cpap nightly per pt on 10/26/21.   SOBOE (shortness of breath on exertion) 10/13/2020   per MD note from Dr. [facility],  SOBOE likely related to obesity & exericise induced.   Social anxiety disorder 1998   Swallowing difficulty    feeling of fullness in upper chest, resolved as of 10/26/2021 per pt   Swelling of both lower extremities    hx of left ankle swelling and knee arthritis   Vitamin D deficiency    Wears contact lenses     Past Surgical History:  Procedure Laterality Date   ANTERIOR (CYSTOCELE) AND POSTERIOR REPAIR (RECTOCELE) WITH XENFORM GRAFT AND SACROSPINOUS FIXATION N/A 11/06/2021   Procedure: ANTERIOR AND POSTERIOR COLPORRHAPHY;  Surgeon: Patton Salles, MD;  Location: [facility];  Service: Gynecology;  Laterality: N/A;   APPENDECTOMY  1972   BLADDER SUSPENSION N/A 11/06/2021   Procedure: TRANSVAGINAL TAPE EXACT MIDURETHRAL;  Surgeon: Ricki Miller  Shirley Friar, MD;  Location: [facility];  Service: Gynecology;  Laterality: N/A;   BREAST ENHANCEMENT SURGERY  10/08/2006   Saline Odis Luster)   COLONOSCOPY     approximately 2014, had previous colonoscopy 5 years ealier   CYSTOSCOPY N/A 11/06/2021   Procedure: CYSTOSCOPY;  Surgeon: Patton Salles, MD;  Location: [facility];  Service: Gynecology;  Laterality: N/A;   DG BARIUM SWALLOW ([facility] HX)     around 2022   TOTAL LAPAROSCOPIC HYSTERECTOMY WITH SALPINGECTOMY Bilateral 11/06/2021   Procedure: TOTAL LAPAROSCOPIC HYSTERECTOMY WITH BILATERAL SALPINGOOOPHORECTOMY.;  Surgeon: Patton Salles, MD;  Location: [facility];   Service: Gynecology;  Laterality: Bilateral;   TUBAL LIGATION  01/1995    Allergies  Allergen Reactions   Iodinated Contrast Media Hives    1 single hive noted after CT contrast administration, MD M. Shogry recommended premedication with oral Benadryl for future CT scans w/contrast.    Cephalosporins Diarrhea    Caused C. Diff per pt    Social History   Socioeconomic History   Marital status: Married    Spouse name: R Adriana Reams   Number of children: Not on file   Years of education: Not on file   Highest education level: Not on file  Occupational History   Not on file  Tobacco Use   Smoking status: Former    Years: 10    Types: Cigarettes    Quit date: 2012    Years since quitting: 12.3   Smokeless tobacco: Never  Vaping Use   Vaping Use: Never used  Substance and Sexual Activity   Alcohol use: Yes    Comment: 1-3 glasses of wine per month   Drug use: No   Sexual activity: Yes    Partners: Male    Birth control/protection: Surgical, Post-menopausal    Comment: BTL  Other Topics Concern   Not on file  Social History Narrative   Not on file   Social Determinants of Health   Financial Resource Strain: Not on file  Food Insecurity: Not on file  Transportation Needs: Not on file  Physical Activity: Not on file  Stress: Not on file  Social Connections: Not on file  Intimate Partner Violence: Not on file    Family History  Problem Relation Age of Onset   Cancer Father        lung   Thyroid disease Father    Cancer Brother        testicular cancer   Osteoarthritis Mother    Migraines Mother    Thyroid disease Mother    Depression Mother    Anxiety disorder Mother    Obesity Mother    Osteoarthritis Maternal Grandmother    Hypertension Paternal Grandmother    Stroke Paternal Grandmother      Review of Systems Constitutional: negative for anorexia, fevers and sweats  Eyes: negative for irritation, redness and visual disturbance  Ears, nose, mouth,  throat, and face: negative for earaches, epistaxis, nasal congestion and sore throat  Respiratory: negative for cough, dyspnea on exertion, sputum and wheezing  Cardiovascular: negative for chest pain, dyspnea, lower extremity edema, orthopnea, palpitations and syncope  Gastrointestinal: negative for abdominal pain, constipation, diarrhea, melena, nausea and vomiting  Genitourinary:negative for dysuria, frequency and hematuria  Hematologic/lymphatic: negative for bleeding, easy bruising and lymphadenopathy  Musculoskeletal:negative for arthralgias, muscle weakness  Neurological: negative for coordination problems, gait problems, headaches and weakness  Endocrine: negative for  diabetic symptoms including polydipsia, polyuria and weight loss     Objective:   Physical Exam  Gen. Pleasant, , in no distress, normal affect ENT - no pallor,icterus, no post nasal drip, class 2 airway Neck: No JVD, no thyromegaly, no carotid bruits Lungs: no use of accessory muscles, no dullness to percussion, decreased without rales or rhonchi  Cardiovascular: Rhythm regular, heart sounds  normal, no murmurs or gallops, no peripheral edema Abdomen: soft and non-tender, no hepatosplenomegaly, BS normal. Musculoskeletal: No deformities, no cyanosis or clubbing Neuro:  alert, non focal, no tremors       Assessment & Plan:    Palpitations -not clear if this is related to anxiety or indicates primary arrhythmia. We will proceed with event monitor for 7 days to ensure that there is no cardiac arrhythmia associated.  She does seem to have frequent enough symptoms to pick this up and in that timeframe.  If negative then we will attribute it to anxiety/panic attacks

## 2023-04-09 ENCOUNTER — Telehealth: Payer: Self-pay | Admitting: Pulmonary Disease

## 2023-04-09 NOTE — Telephone Encounter (Signed)
Patient checking on referral for heart monitor.Patient phone number is [phone].

## 2023-04-12 NOTE — Telephone Encounter (Signed)
PCC's, can you please advise on the status of heart monitor that Dr Vassie Loll ordered?   Thanks!   Procedure Abnormality Status  LONG TERM MONITOR (3-14 DAYS)     Order Providers  Authorizing Encounter  [place] [employer]        Authorizing Provider Audit Trail  Date/Time Authorizing Provider Changed by  02/11/2023 10:16 AM Oretha Milch, MD Oretha Milch, MD  Order History Outpatient Date/Time Action Taken User Additional Information  02/11/23 1016 Sign Oretha Milch, MD          Associated Diagnoses  Palpitations [R00.2]  - Primary            Order Questions  Question Answer  Where should this test be performed? [facility]  Does the patient have an implanted cardiac device? No  Prescribed days of wear 7  Type of enrollment Home Enrollment  Vendor: [employer]          Appointments for this Order  Date/Time Provider Department  02/11/23 9:30 AM  - 30 min Oretha Milch, MD [facility]

## 2023-04-25 ENCOUNTER — Other Ambulatory Visit: Payer: Self-pay | Admitting: Family Medicine

## 2023-04-25 DIAGNOSIS — M25551 Pain in right hip: Secondary | ICD-10-CM

## 2023-04-29 ENCOUNTER — Ambulatory Visit
Admission: RE | Admit: 2023-04-29 | Discharge: 2023-04-29 | Disposition: A | Discharge status: Home / Self Care | Payer: 59 | Source: Ambulatory Visit | Attending: Family Medicine | Admitting: Family Medicine

## 2023-04-29 DIAGNOSIS — M25551 Pain in right hip: Secondary | ICD-10-CM

## 2023-05-02 ENCOUNTER — Other Ambulatory Visit: Payer: Self-pay | Admitting: *Deleted

## 2023-05-02 ENCOUNTER — Ambulatory Visit: Payer: 59 | Attending: Family Medicine

## 2023-05-02 DIAGNOSIS — R002 Palpitations: Secondary | ICD-10-CM

## 2023-05-02 NOTE — Progress Notes (Unsigned)
Enrolled for [employer] to mail a [employer] XT long term holter monitor to the patients address on file.   DOD to read.  Fax results to Dr. Mosetta Putt [phone].

## 2023-05-06 DIAGNOSIS — R002 Palpitations: Secondary | ICD-10-CM

## 2023-05-28 ENCOUNTER — Telehealth: Payer: Self-pay

## 2023-05-28 NOTE — Telephone Encounter (Signed)
Spoke with patient and scheduled OV for 06/03/23.

## 2023-05-28 NOTE — Telephone Encounter (Signed)
-----   Message from Naaman Plummer sent at 05/22/2023  2:52 PM EDT ----- Needs OV, referral from Dr. Duaine Dredge ----- Message ----- From: Sharlet Salina, CMA Sent: 05/22/2023   2:43 PM EDT To: Tyrell Antonio, MD; Juanda Chance, NP   ----- Message ----- From: Dorcas Mcmurray Sent: 05/22/2023   2:34 PM EDT To: Sharlet Salina, CMA

## 2023-06-03 ENCOUNTER — Ambulatory Visit: Payer: 59 | Admitting: Physical Medicine and Rehabilitation

## 2023-06-03 ENCOUNTER — Encounter: Payer: Self-pay | Admitting: Physical Medicine and Rehabilitation

## 2023-06-03 VITALS — BP 105/73 | HR 76 | Wt 185.0 lb

## 2023-06-03 DIAGNOSIS — M545 Low back pain, unspecified: Secondary | ICD-10-CM

## 2023-06-03 DIAGNOSIS — M47816 Spondylosis without myelopathy or radiculopathy, lumbar region: Secondary | ICD-10-CM | POA: Diagnosis not present

## 2023-06-03 DIAGNOSIS — G8929 Other chronic pain: Secondary | ICD-10-CM

## 2023-06-03 NOTE — Progress Notes (Signed)
Lower back pain right side. Has taken 2 tylenol arthritis- has gotten worse over years. Had MRI done couple months ago.   Functional Pain Scale - descriptive words and definitions  Distracting (5)    Aware of pain/able to complete some ADL's but limited by pain/sleep is affected and active distractions are only slightly useful. Moderate range order  Average Pain 5   +Driver, -BT, -Dye Allergies.

## 2023-06-03 NOTE — Progress Notes (Signed)
Lisa Wade - 62 y.o. female MRN [id number]  Date of birth: 10/23/1960  Office Visit Note: Visit Date: 06/03/2023 PCP: Mosetta Putt, MD Referred by: Mosetta Putt, MD  Subjective: Chief Complaint  Patient presents with   Spine - Pain   Lower Back - Pain   HPI: Lisa Wade is a 62 y.o. female who comes in today per the request of Dr. Mosetta Putt for evaluation of chronic, worsening and severe right sided lower back pain radiating to buttock and anterior thigh. Pain ongoing for several years, worsens with standing, activity and prolonged sitting. Her pain is constant, describes as aching and burning sensation, currently rates as 5 out of 10. Some relief of pain with home exercise regimen, rest and use of medications. Some relief with Tylenol arthritis. History of pool therapy at [facility] with minimal relief of pain. Recent MRI imaging of right hip exhibits degenerative facet arthropathy of lower lumbar spine with reactive subchondral marrow edema associated with the right L5-S1 facet joint. Lumbar MRI imaging from 2023 shows mild degenerative changes without central or foraminal stenosis. 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 History of uterine fibroids and hysterectomy. Patient denies focal weakness, numbness and tingling. No recent trauma or falls.   Of note, patient did mention concern for hereditary peripheral neuropathy.        Review of Systems  Musculoskeletal:  Positive for back pain.  Neurological:  Negative for tingling, sensory change, focal weakness and weakness.  All other systems reviewed and are negative.  Otherwise per HPI.  Assessment & Plan: Visit Diagnoses:    ICD-10-CM   1. Chronic right-sided low back pain without sciatica  M54.50 Ambulatory referral to Physical Medicine Rehab   G89.29     2. Facet arthropathy, lumbar  M47.816 Ambulatory referral to Physical Medicine Rehab       Plan:  Findings:  Chronic, worsening and severe right sided lower back pain radiating to buttock and anterior thigh. Patient continues to have severe pain despite good conservative therapies such as home exercise regimen, rest and use of medications. Patients clinical presentation and exam are consistent with facet mediated pain. There is reactive subchondral marrow edema associated with the right L5-S1 facet joint. I do think her symptoms fit more with facet joint syndrome. Next step is to perform diagnostic and hopefully therapeutic right L5-S1 facet joint injection under fluoroscopic guidance. If good relief of pain we discussed possibility of longer sustained pain relief with radiofrequency ablation. I encouraged patient to remain active as tolerated. No red flag symptoms noted upon exam today.   Dr. Alvester Morin at bedside to discuss injection procedure. She has no questions at this time. He also talked in detail with patient regarding diagnosis, progression and treatment of peripheral neuropathy. She can follow up with [place] or see Dr. Duaine Dredge should she experience symptoms.     Meds & Orders: No orders of the defined types were placed in this encounter.   Orders Placed This Encounter  Procedures   Ambulatory referral to Physical Medicine Rehab    Follow-up: Return for Right L5-S1 facet joint injection.   Procedures: No procedures performed      Clinical History: CLINICAL DATA:  Low back pain radiating into legs, right-greater-than-left   EXAM: MRI LUMBAR SPINE WITHOUT CONTRAST   TECHNIQUE: Multiplanar, multisequence MR imaging of the lumbar spine was performed. No intravenous contrast was administered.   COMPARISON:  02/03/2014   FINDINGS: Segmentation:  Standard.   Alignment:  Mild levocurvature.  No significant listhesis.   Vertebrae: No acute fracture or suspicious osseous lesion. Endplate degenerative changes at L3-L5.   Conus medullaris and cauda equina: Conus extends to the L1  level. Conus and cauda equina appear normal.   Paraspinal and other soft tissues: No acute finding.   Disc levels:   T12-L1: No significant disc bulge. No spinal canal stenosis or neural foraminal narrowing.   L1-L2: Minimal disc bulge. Mild facet arthropathy. No spinal canal stenosis or neural foraminal narrowing.   L2-L3: Minimal disc bulge with central annular fissure. Mild facet arthropathy. No spinal canal stenosis or neural foraminal narrowing.   L3-L4: Minimal disc bulge with small left foraminal protrusion. Mild facet arthropathy. No spinal canal stenosis or neural foraminal narrowing.   L4-L5: Minimal disc bulge. Mild facet arthropathy. No spinal canal stenosis or neural foraminal narrowing.   L5-S1: Minimal disc bulge. Mild facet arthropathy. No spinal canal stenosis or neural foraminal narrowing.   IMPRESSION: Mild degenerative changes without spinal canal stenosis or neural foraminal narrowing.     Electronically Signed   By: Wiliam Ke M.D.   On: 03/27/2022 02:55   She reports that she quit smoking about 12 years ago. Her smoking use included cigarettes. She started smoking about 22 years ago. She has never used smokeless tobacco. No results for input(s): "HGBA1C", "LABURIC" in the last 8760 hours.  Objective:  VS:  HT:    WT:185 lb (83.9 kg)  BMI:     BP:105/73  HR:76bpm  TEMP: ( )  RESP:  Physical Exam Vitals and nursing note reviewed.  HENT:     Head: Normocephalic and atraumatic.     Right Ear: External ear normal.     Left Ear: External ear normal.     Nose: Nose normal.     Mouth/Throat:     Mouth: Mucous membranes are moist.  Eyes:     Extraocular Movements: Extraocular movements intact.  Cardiovascular:     Rate and Rhythm: Normal rate.     Pulses: Normal pulses.  Pulmonary:     Effort: Pulmonary effort is normal.  Abdominal:     General: Abdomen is flat. There is no distension.  Musculoskeletal:        General: Tenderness  present.     Cervical back: Normal range of motion.     Comments: Patient rises from seated position to standing without difficulty. Pain noted with facet loading and lumbar extension. 5/5 strength noted with bilateral hip flexion, knee flexion/extension, ankle dorsiflexion/plantarflexion and EHL. No clonus noted bilaterally. No pain upon palpation of greater trochanters. No pain with internal/external rotation of bilateral hips. Sensation intact bilaterally. Negative slump test bilaterally. Ambulates without aid, gait steady.     Skin:    General: Skin is warm and dry.     Capillary Refill: Capillary refill takes less than 2 seconds.  Neurological:     General: No focal deficit present.     Mental Status: She is alert and oriented to person, place, and time.  Psychiatric:        Mood and Affect: Mood normal.        Behavior: Behavior normal.     Ortho Exam  Imaging: No results found.  Past Medical/Family/Surgical/Social History: Medications & Allergies reviewed per EMR, new medications updated. Patient Active Problem List   Diagnosis Date Noted   Upper airway cough syndrome 01/01/2022   Status post laparoscopic hysterectomy 11/06/2021   At risk for  depression 03/15/2021   At risk for deficient intake of food 12/19/2020   Prediabetes 12/19/2020   At risk for malnutrition 11/09/2020   SOBOE (shortness of breath on exertion) 10/13/2020   Fatigue 10/13/2020   Other hyperlipidemia 10/13/2020   OSA on CPAP 10/13/2020   Vitamin D deficiency 10/13/2020   Gastroesophageal reflux disease 10/13/2020   Mood disorder (HCC), with emotional eating 10/13/2020   At risk for heart disease 10/13/2020   Ingrown toenail 05/17/2020   Plantar fasciitis 05/17/2020   Social anxiety disorder    Past Medical History:  Diagnosis Date   Acid reflux    Anxiety    Back pain    Bladder prolapse, female, acquired    C. difficile colitis    from cephalosporins   Chondromalacia    COVID 08/12/2021    mild symptoms   Dysplasia of cervix, low grade (CIN 1) 1991   Fibroids    uterine   Heartburn    High cholesterol    History of COVID-19 06/05/2021   History of prediabetes    last A1C on 10/04/21 was 5.6   HSV-1 infection 2010   cold sores   Lumbar spondylosis    Mild depression    Palpitations    Plantar fasciitis, bilateral    Simple endometrial hyperplasia without atypia 08/2021   Sleep apnea 04/02/2017   home sleep study, Pulmonologist LOV 08/23/2017 with Dr. Lenard Forth. Patient uses cpap nightly per pt on 10/26/21.   SOBOE (shortness of breath on exertion) 10/13/2020   per MD note from Dr. [facility],  SOBOE likely related to obesity & exericise induced.   Social anxiety disorder 1998   Swallowing difficulty    feeling of fullness in upper chest, resolved as of 10/26/2021 per pt   Swelling of both lower extremities    hx of left ankle swelling and knee arthritis   Vitamin D deficiency    Wears contact lenses    Family History  Problem Relation Age of Onset   Cancer Father        lung   Thyroid disease Father    Cancer Brother        testicular cancer   Osteoarthritis Mother    Migraines Mother    Thyroid disease Mother    Depression Mother    Anxiety disorder Mother    Obesity Mother    Osteoarthritis Maternal Grandmother    Hypertension Paternal Grandmother    Stroke Paternal Grandmother    Past Surgical History:  Procedure Laterality Date   ANTERIOR (CYSTOCELE) AND POSTERIOR REPAIR (RECTOCELE) WITH XENFORM GRAFT AND SACROSPINOUS FIXATION N/A 11/06/2021   Procedure: ANTERIOR AND POSTERIOR COLPORRHAPHY;  Surgeon: Patton Salles, MD;  Location: [facility];  Service: Gynecology;  Laterality: N/A;   APPENDECTOMY  1972   BLADDER SUSPENSION N/A 11/06/2021   Procedure: TRANSVAGINAL TAPE EXACT MIDURETHRAL;  Surgeon: Patton Salles, MD;  Location: [facility];  Service: Gynecology;  Laterality: N/A;    BREAST ENHANCEMENT SURGERY  10/08/2006   Saline Odis Luster)   COLONOSCOPY     approximately 2014, had previous colonoscopy 5 years ealier   CYSTOSCOPY N/A 11/06/2021   Procedure: CYSTOSCOPY;  Surgeon: Patton Salles, MD;  Location: [facility];  Service: Gynecology;  Laterality: N/A;   DG BARIUM SWALLOW ([facility] HX)     around 2022   TOTAL LAPAROSCOPIC HYSTERECTOMY WITH SALPINGECTOMY Bilateral 11/06/2021   Procedure: TOTAL LAPAROSCOPIC HYSTERECTOMY  WITH BILATERAL SALPINGOOOPHORECTOMY.;  Surgeon: Patton Salles, MD;  Location: [facility];  Service: Gynecology;  Laterality: Bilateral;   TUBAL LIGATION  01/1995   Social History   Occupational History   Not on file  Tobacco Use   Smoking status: Former    Current packs/day: 0.00    Types: Cigarettes    Start date: 2002    Quit date: 2012    Years since quitting: 12.6   Smokeless tobacco: Never  Vaping Use   Vaping status: Never Used  Substance and Sexual Activity   Alcohol use: Yes    Comment: 1-3 glasses of wine per month   Drug use: No   Sexual activity: Yes    Partners: Male    Birth control/protection: Surgical, Post-menopausal    Comment: BTL

## 2023-06-13 NOTE — Progress Notes (Signed)
62 y.o. G46P0000 Married Caucasian female here for annual exam.    As some left lateral breast pain.  Does not feel a lump.  No new exercise.  No trauma.  Has implants.  No urinary incontinence.   Does not need refill of Valtrex.  Has plenty at home.  Not having outbreaks.   Some discomfort with intercourse. Partner is not having pain.   Patient's last menstrual period was 07/08/2020 (approximate).           Sexually active: Yes.    The current method of family planning is status post hysterectomy.    Exercising: No.   Smoker:  former  Health Maintenance: Pap:  06/15/21 neg: HR HPV neg,  03-28-16 Neg:neg HR HPV, 02-04-14 Neg:Neg HR HPV  History of abnormal Pap:  yes, 1991 Hx CIN 1 MMG:  08-21-21 Neg/Birads1. Was not able to receive mammo (they were not able to schedule) Colonoscopy:  2015 normal BMD:   08/27/22  Result  osteopenic TDaP:  unsure, PCP Gardasil:   no HIV: 03/28/16 NR Hep C: 03/28/16 neg Screening Labs:     reports that she quit smoking about 12 years ago. Her smoking use included cigarettes. She started smoking about 22 years ago. She has never used smokeless tobacco. She reports current alcohol use. She reports that she does not use drugs.  Past Medical History:  Diagnosis Date   Acid reflux    Anxiety    Back pain    Bladder prolapse, female, acquired    C. 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 Lenard Forth. Patient uses cpap nightly per pt on  10/26/21.   SOBOE (shortness of breath on exertion) 10/13/2020   per MD note from Dr. [facility],  SOBOE likely related to obesity & exericise induced.   Social anxiety disorder 1998   Swallowing difficulty    feeling of fullness in upper chest, resolved as of 10/26/2021 per pt   Swelling of both lower extremities    hx of left ankle swelling and knee arthritis   Vitamin D deficiency    Wears contact lenses     Past Surgical History:  Procedure Laterality Date   ANTERIOR (CYSTOCELE) AND POSTERIOR REPAIR (RECTOCELE) WITH XENFORM GRAFT AND SACROSPINOUS FIXATION N/A 11/06/2021   Procedure: ANTERIOR AND POSTERIOR COLPORRHAPHY;  Surgeon: Patton Salles, MD;  Location: [facility];  Service: Gynecology;  Laterality: N/A;   APPENDECTOMY  1972   BLADDER SUSPENSION N/A 11/06/2021   Procedure: TRANSVAGINAL TAPE EXACT MIDURETHRAL;  Surgeon: Patton Salles, MD;  Location: [facility];  Service: Gynecology;  Laterality: N/A;   BREAST ENHANCEMENT SURGERY  10/08/2006   Saline Odis Luster)   COLONOSCOPY     approximately 2014, had previous colonoscopy 5 years ealier   CYSTOSCOPY N/A 11/06/2021   Procedure: CYSTOSCOPY;  Surgeon: Patton Salles, MD;  Location: [facility];  Service: Gynecology;  Laterality: N/A;   DG BARIUM SWALLOW ([facility] HX)     around 2022   TOTAL LAPAROSCOPIC HYSTERECTOMY WITH SALPINGECTOMY Bilateral 11/06/2021   Procedure: TOTAL LAPAROSCOPIC HYSTERECTOMY WITH BILATERAL SALPINGOOOPHORECTOMY.;  Surgeon: Patton Salles, MD;  Location: [facility];  Service: Gynecology;  Laterality: Bilateral;   TUBAL LIGATION  01/1995    Current Outpatient Medications  Medication Sig Dispense Refill   albuterol (VENTOLIN HFA) 108 (90 Base) MCG/ACT inhaler SMARTSIG:1-2 Puff(s) Via Inhaler Every 4 Hours PRN     escitalopram (LEXAPRO) 20 MG tablet Take 20 mg by mouth daily.     oxyCODONE (OXY  IR/ROXICODONE) 5 MG immediate release tablet Take 5 mg by mouth every 4 (four) hours as needed.     rosuvastatin (CRESTOR) 10 MG tablet SMARTSIG:1 Tablet(s) By Mouth Every Evening     tirzepatide (MOUNJARO) 5 MG/0.5ML Pen Inject 5 mg into the skin once a week.     triamcinolone cream (KENALOG) 0.1 % daily as needed.  2   valACYclovir (VALTREX) 500 MG tablet Take one tablet (500 mg) by mouth twice daily for 3 days for an outbreak. 30 tablet 2   No current facility-administered medications for this visit.    Family History  Problem Relation Age of Onset   Cancer Father        lung   Thyroid disease Father    Cancer Brother        testicular cancer   Osteoarthritis Mother    Migraines Mother    Thyroid disease Mother    Depression Mother    Anxiety disorder Mother    Obesity Mother    Osteoarthritis Maternal Grandmother    Hypertension Paternal Grandmother    Stroke Paternal Grandmother     Review of Systems  All other systems reviewed and are negative.   Exam:   BP 122/70 (BP Location: Left Arm, Patient Position: Sitting, Cuff Size: Normal)   Pulse 87   Ht 5\' 4"  (1.626 m)   Wt 182 lb (82.6 kg)   LMP 07/08/2020 (Approximate)   SpO2 98%   BMI 31.24 kg/m     General appearance: alert, cooperative and appears stated age Head: normocephalic, without obvious abnormality, atraumatic Neck: no adenopathy, supple, symmetrical, trachea midline and thyroid normal to inspection and palpation Lungs: clear to auscultation bilaterally Breasts: normal appearance, bilateral implants, no masses or tenderness, No nipple retraction or dimpling, No nipple discharge or bleeding, No axillary adenopathy Heart: regular rate and rhythm Abdomen: soft, non-tender; no masses, no organomegaly Extremities: extremities normal, atraumatic, no cyanosis or edema Skin: skin color, texture, turgor normal. No rashes or lesions Lymph nodes: cervical, supraclavicular, and axillary nodes normal. Neurologic:  grossly normal  Pelvic: External genitalia:  no lesions              No abnormal inguinal nodes palpated.              Urethra:  normal appearing urethra with no masses, tenderness or lesions              Bartholins and Skenes: normal                 Vagina: normal appearing vagina with normal color and discharge, no lesions              Cervix:  absent  Pap taken: no Bimanual Exam:  Uterus:  absent              Adnexa: no mass, fullness, tenderness              Rectal exam: yes.  Confirms.              Anus:  normal sphincter tone, no lesions  Chaperone was present for exam:  Warren Lacy, CMA  Assessment:   Well woman visit with gynecologic exam. Status post total laparoscopic hysterectomy with bilateral salpingo-oophorectomy, lysis of adhesion of right adnexa, TVT Exact midurethral sling, cystoscopy, anterior and posterior colporrhaphy. Vaginal atrophy.  Hx HSV I.  Bilateral breast implants.  Left lateral breast pain.   Plan: Will schedule dx bilateral mammogram and left breast US. Self breast awareness reviewed. Pap and HR HPV not indicated.  Guidelines for Calcium, Vitamin D, regular exercise program including cardiovascular and weight bearing exercise. We discussed vit E, lubricants, and vaginal estrogens.  She would have a preference for the tablets.  We discussed potential effect on breast cancer.  We can consider this after her mammogram/US is back and reassuring.  Follow up annually and prn.    After visit summary provided.

## 2023-06-17 ENCOUNTER — Other Ambulatory Visit: Payer: Self-pay

## 2023-06-17 ENCOUNTER — Telehealth: Payer: Self-pay | Admitting: Pulmonary Disease

## 2023-06-17 ENCOUNTER — Ambulatory Visit: Payer: 59 | Admitting: Physical Medicine and Rehabilitation

## 2023-06-17 VITALS — BP 108/73 | HR 70

## 2023-06-17 DIAGNOSIS — G4733 Obstructive sleep apnea (adult) (pediatric): Secondary | ICD-10-CM

## 2023-06-17 DIAGNOSIS — M47816 Spondylosis without myelopathy or radiculopathy, lumbar region: Secondary | ICD-10-CM

## 2023-06-17 MED ORDER — METHYLPREDNISOLONE ACETATE 80 MG/ML IJ SUSP
80.0000 mg | Freq: Once | INTRAMUSCULAR | Status: AC
  Administered 2023-06-17: 80 mg

## 2023-06-17 NOTE — Progress Notes (Unsigned)
Functional Pain Scale - descriptive words and definitions  Moderate (4)   Constantly aware of pain, can complete ADLs with modification/sleep marginally affected at times/passive distraction is of no use, but active distraction gives some relief. Moderate range order  Average Pain  varies   +Driver, -BT, -Dye Allergies.  Lower back pain on right side with some radiation in the right hip. Sitting and standing too long makes pain worse

## 2023-06-17 NOTE — Telephone Encounter (Signed)
Lisa Wade states needs order for CPAP machine pressure settings. Also needs office notes. Lisa Wade phone number is [phone]. Fax number is [phone].

## 2023-06-17 NOTE — Patient Instructions (Signed)

## 2023-06-19 NOTE — Procedures (Signed)
Lumbar Facet Joint Intra-Articular Injection(s) with Fluoroscopic Guidance  [name]      Date of Birth: [birth year] MRN: [id number] PCP: Mosetta Putt, MD      Visit Date: 06/17/2023   Universal Protocol:    Date/Time: 06/17/2023  Consent Given By: the patient  Position: PRONE   Additional Comments: Vital signs were monitored before and after the procedure. Patient was prepped and draped in the usual sterile fashion. The correct patient, procedure, and site was verified.   Injection Procedure Details:  Procedure Site One Meds Administered:  Meds ordered this encounter  Medications   methylPREDNISolone acetate (DEPO-MEDROL) injection 80 mg     Laterality: Right  Location/Site:  L5-S1  Needle size: 22 guage  Needle type: Spinal  Needle Placement: Articular  Findings:  -Comments: Excellent flow of contrast producing a partial arthrogram.  Procedure Details: The fluoroscope beam is vertically oriented in AP, and the inferior recess is visualized beneath the lower pole of the inferior apophyseal process, which represents the target point for needle insertion. When direct visualization is difficult the target point is located at the medial projection of the vertebral pedicle. The region overlying each aforementioned target is locally anesthetized with a 1 to 2 ml. volume of 1% Lidocaine without Epinephrine.   The spinal needle was inserted into each of the above mentioned facet joints using biplanar fluoroscopic guidance. A 0.25 to 0.5 ml. volume of Isovue-250 was injected and a partial facet joint arthrogram was obtained. A single spot film was obtained of the resulting arthrogram.    One to 1.25 ml of the steroid/anesthetic solution was then injected into each of the facet joints noted above.   Additional Comments:  No complications occurred Dressing: 2 x 2 sterile gauze and Band-Aid    Post-procedure details: Patient was observed during the  procedure. Post-procedure instructions were reviewed.  Patient left the clinic in stable condition.

## 2023-06-19 NOTE — Progress Notes (Signed)
Lisa Wade - 62 y.o. female MRN [id number]  Date of birth: 1961/01/29  Office Visit Note: Visit Date: 06/17/2023 PCP: Mosetta Putt, MD Referred by: Mosetta Putt, MD  Subjective: Chief Complaint  Patient presents with   Lower Back - Pain   HPI:  Lisa Wade is a 62 y.o. female who comes in today at the request of Ellin Goodie, FNP for planned Right  L5-S1 Lumbar facet/medial branch block with fluoroscopic guidance.  The patient has failed conservative care including home exercise, medications, time and activity modification.  This injection will be diagnostic and hopefully therapeutic.  Please see requesting physician notes for further details and justification.  Exam has shown concordant pain with facet joint loading.   ROS Otherwise per HPI.  Assessment & Plan: Visit Diagnoses:    ICD-10-CM   1. Spondylosis without myelopathy or radiculopathy, lumbar region  M47.816 XR C-ARM NO REPORT    Epidural Steroid injection    Facet Injection    methylPREDNISolone acetate (DEPO-MEDROL) injection 80 mg      Plan: No additional findings.   Meds & Orders:  Meds ordered this encounter  Medications   methylPREDNISolone acetate (DEPO-MEDROL) injection 80 mg    Orders Placed This Encounter  Procedures   Facet Injection   XR C-ARM NO REPORT   Epidural Steroid injection    Follow-up: Return for visit to requesting provider as needed.   Procedures: No procedures performed  Lumbar Facet Joint Intra-Articular Injection(s) with Fluoroscopic Guidance  [name]      Date of Birth: 28-Mar-1961 MRN: [id number] PCP: Mosetta Putt, MD      Visit Date: 06/17/2023   Universal Protocol:    Date/Time: 06/17/2023  Consent Given By: the patient  Position: PRONE   Additional Comments: Vital signs were monitored before and after the procedure. Patient was prepped and draped in the usual sterile fashion. The correct patient, procedure, and site was  verified.   Injection Procedure Details:  Procedure Site One Meds Administered:  Meds ordered this encounter  Medications   methylPREDNISolone acetate (DEPO-MEDROL) injection 80 mg     Laterality: Right  Location/Site:  L5-S1  Needle size: 22 guage  Needle type: Spinal  Needle Placement: Articular  Findings:  -Comments: Excellent flow of contrast producing a partial arthrogram.  Procedure Details: The fluoroscope beam is vertically oriented in AP, and the inferior recess is visualized beneath the lower pole of the inferior apophyseal process, which represents the target point for needle insertion. When direct visualization is difficult the target point is located at the medial projection of the vertebral pedicle. The region overlying each aforementioned target is locally anesthetized with a 1 to 2 ml. volume of 1% Lidocaine without Epinephrine.   The spinal needle was inserted into each of the above mentioned facet joints using biplanar fluoroscopic guidance. A 0.25 to 0.5 ml. volume of Isovue-250 was injected and a partial facet joint arthrogram was obtained. A single spot film was obtained of the resulting arthrogram.    One to 1.25 ml of the steroid/anesthetic solution was then injected into each of the facet joints noted above.   Additional Comments:  No complications occurred Dressing: 2 x 2 sterile gauze and Band-Aid    Post-procedure details: Patient was observed during the procedure. Post-procedure instructions were reviewed.  Patient left the clinic in stable condition.    Clinical History: CLINICAL DATA:  Low back pain radiating into legs, right-greater-than-left   EXAM: MRI LUMBAR SPINE WITHOUT CONTRAST  TECHNIQUE: Multiplanar, multisequence MR imaging of the lumbar spine was performed. No intravenous contrast was administered.   COMPARISON:  02/03/2014   FINDINGS: Segmentation:  Standard.   Alignment:  Mild levocurvature.  No significant  listhesis.   Vertebrae: No acute fracture or suspicious osseous lesion. Endplate degenerative changes at L3-L5.   Conus medullaris and cauda equina: Conus extends to the L1 level. Conus and cauda equina appear normal.   Paraspinal and other soft tissues: No acute finding.   Disc levels:   T12-L1: No significant disc bulge. No spinal canal stenosis or neural foraminal narrowing.   L1-L2: Minimal disc bulge. Mild facet arthropathy. No spinal canal stenosis or neural foraminal narrowing.   L2-L3: Minimal disc bulge with central annular fissure. Mild facet arthropathy. No spinal canal stenosis or neural foraminal narrowing.   L3-L4: Minimal disc bulge with small left foraminal protrusion. Mild facet arthropathy. No spinal canal stenosis or neural foraminal narrowing.   L4-L5: Minimal disc bulge. Mild facet arthropathy. No spinal canal stenosis or neural foraminal narrowing.   L5-S1: Minimal disc bulge. Mild facet arthropathy. No spinal canal stenosis or neural foraminal narrowing.   IMPRESSION: Mild degenerative changes without spinal canal stenosis or neural foraminal narrowing.     Electronically Signed   By: Wiliam Ke M.D.   On: 03/27/2022 02:55     Objective:  VS:  HT:    WT:   BMI:     BP:108/73  HR:70bpm  TEMP: ( )  RESP:  Physical Exam Vitals and nursing note reviewed.  Constitutional:      General: She is not in acute distress.    Appearance: Normal appearance. She is not ill-appearing.  HENT:     Head: Normocephalic and atraumatic.     Right Ear: External ear normal.     Left Ear: External ear normal.  Eyes:     Extraocular Movements: Extraocular movements intact.  Cardiovascular:     Rate and Rhythm: Normal rate.     Pulses: Normal pulses.  Pulmonary:     Effort: Pulmonary effort is normal. No respiratory distress.  Abdominal:     General: There is no distension.     Palpations: Abdomen is soft.  Musculoskeletal:        General:  Tenderness present.     Cervical back: Neck supple.     Right lower leg: No edema.     Left lower leg: No edema.     Comments: Patient has good distal strength with no pain over the greater trochanters.  No clonus or focal weakness.  Skin:    Findings: No erythema, lesion or rash.  Neurological:     General: No focal deficit present.     Mental Status: She is alert and oriented to person, place, and time.     Sensory: No sensory deficit.     Motor: No weakness or abnormal muscle tone.     Coordination: Coordination normal.  Psychiatric:        Mood and Affect: Mood normal.        Behavior: Behavior normal.      Imaging: No results found.

## 2023-06-20 NOTE — Telephone Encounter (Signed)
Order placed for settings to [employer].

## 2023-06-27 ENCOUNTER — Telehealth: Payer: Self-pay | Admitting: Obstetrics and Gynecology

## 2023-06-27 ENCOUNTER — Encounter: Payer: Self-pay | Admitting: Obstetrics and Gynecology

## 2023-06-27 ENCOUNTER — Ambulatory Visit (INDEPENDENT_AMBULATORY_CARE_PROVIDER_SITE_OTHER): Payer: 59 | Admitting: Obstetrics and Gynecology

## 2023-06-27 VITALS — BP 122/70 | HR 87 | Ht 64.0 in | Wt 182.0 lb

## 2023-06-27 DIAGNOSIS — Z01419 Encounter for gynecological examination (general) (routine) without abnormal findings: Secondary | ICD-10-CM | POA: Diagnosis not present

## 2023-06-27 DIAGNOSIS — N644 Mastodynia: Secondary | ICD-10-CM

## 2023-06-27 NOTE — Telephone Encounter (Signed)
Please schedule a [facility] bilateral [facility]   My patient has left lateral breast pain.  She has breast implants.

## 2023-06-27 NOTE — Patient Instructions (Signed)

## 2023-06-28 NOTE — Telephone Encounter (Signed)
Spoke w/ [facility] and got pt scheduled for their first available on 07/25/2023 @ 120. Arrival time at 1pm. Pt advised since [facility] does not have a cancellation list, she is encouraged to call on a daily basis to check for cancellations.   Pt notified and voiced understanding. Routing to provider for final review.

## 2023-07-25 ENCOUNTER — Ambulatory Visit: Admission: RE | Admit: 2023-07-25 | Payer: 59 | Source: Ambulatory Visit

## 2023-07-25 ENCOUNTER — Ambulatory Visit
Admission: RE | Admit: 2023-07-25 | Discharge: 2023-07-25 | Disposition: A | Discharge status: Home / Self Care | Payer: 59 | Source: Ambulatory Visit | Attending: Obstetrics and Gynecology | Admitting: Obstetrics and Gynecology

## 2023-07-25 DIAGNOSIS — N644 Mastodynia: Secondary | ICD-10-CM

## 2023-08-05 ENCOUNTER — Telehealth: Payer: Self-pay | Admitting: Physical Medicine and Rehabilitation

## 2023-08-05 NOTE — Telephone Encounter (Signed)
Patient called advised the injection last approximately 6 weeks. Patient asked  what is the next step for her? Patient said the pain is back.  The number to contact patient is [phone]

## 2023-08-06 ENCOUNTER — Other Ambulatory Visit: Payer: Self-pay | Admitting: Physical Medicine and Rehabilitation

## 2023-08-06 DIAGNOSIS — M545 Low back pain, unspecified: Secondary | ICD-10-CM

## 2023-08-06 DIAGNOSIS — M47816 Spondylosis without myelopathy or radiculopathy, lumbar region: Secondary | ICD-10-CM

## 2023-08-06 NOTE — Telephone Encounter (Signed)
Spoke with patient and she stated she got about 90% relief during the 6 weeks

## 2023-08-14 ENCOUNTER — Ambulatory Visit: Payer: 59 | Admitting: Physical Medicine and Rehabilitation

## 2023-08-14 DIAGNOSIS — M545 Low back pain, unspecified: Secondary | ICD-10-CM

## 2023-08-14 DIAGNOSIS — R109 Unspecified abdominal pain: Secondary | ICD-10-CM | POA: Diagnosis not present

## 2023-08-14 DIAGNOSIS — S39012A Strain of muscle, fascia and tendon of lower back, initial encounter: Secondary | ICD-10-CM

## 2023-08-14 DIAGNOSIS — M47816 Spondylosis without myelopathy or radiculopathy, lumbar region: Secondary | ICD-10-CM

## 2023-08-14 MED ORDER — METHOCARBAMOL 500 MG PO TABS: 500.0000 mg | ORAL_TABLET | Freq: Three times a day (TID) | ORAL | 0 refills | Status: AC | PRN

## 2023-08-20 ENCOUNTER — Encounter: Payer: Self-pay | Admitting: Physical Medicine and Rehabilitation

## 2023-08-20 NOTE — Progress Notes (Signed)
Lisa Wade - 62 y.o. female MRN [id number]  Date of birth: [birth year]  Office Visit Note: Visit Date: 08/14/2023 PCP: Mosetta Putt, MD Referred by: Mosetta Putt, MD  Subjective: Chief Complaint  Patient presents with   Lower Back - Pain   HPI: Lisa Wade is a 62 y.o. female who comes in today for evaluation and management at the request of Dr. Amado Nash for chronic worsening right low back and essentially flank pain.  Her history is that Dr. Duaine Dredge sent her to [place] that we completed facet joint block and it did help her quite a bit.  She feels like the pain is different [place] is a little higher up still right sided still low back but may be a little bit more lateral.  Seems to be somewhat worse with movement and standing but can be present on its own.  No referral pain down into the groin or anterior pain.  She wonders if this could be from potentially a UTI or kidney stone.  She has not had a history of kidney stones.  She was started on antibiotic, ciprofloxacin.  She states that she is going to talk to Dr. Duaine Dredge about workup for kidney stone potentially.  Her pain level is not as acute as you would think for kidney stone.  She denies any new trauma or falls.  She does not get any symptoms down the legs.  MRI prior showed mainly facet arthritis.  She has not had recent physical therapy or mechanical treatment or dry needling etc.  She has not noted any focal weakness.  She has not had any real other urinary complaints.  No fevers chills or night sweats.   I spent more than 30 minutes speaking face-to-face with the patient with 50% of the time in counseling and discussing coordination of care.       Review of Systems  Genitourinary:  Positive for flank pain.  Musculoskeletal:  Positive for back pain.  All other systems reviewed and are negative.  Otherwise per HPI.  Assessment & Plan: Visit Diagnoses:    ICD-10-CM   1. Acute right-sided low back pain without  sciatica  M54.50 Ambulatory referral to Physical Therapy    2. Spondylosis without myelopathy or radiculopathy, lumbar region  M47.816 Ambulatory referral to Physical Therapy    3. Strain of lumbar region, initial encounter  S39.012A Ambulatory referral to Physical Therapy    4. Flank pain, acute  R10.9 Ambulatory referral to Physical Therapy       Plan: Findings:  Chronic low back pain with relief with prior facet joint block on the right at L5-S1.  She does have some arthritic changes above this level.  Her pain is somewhat consistent with facet mediated pain but could be related more to myofascial pain.  Exam did show focal trigger point across this area that did reproduce some of her pain.  She will continue workup with Dr. Duaine Dredge on the idea of maybe this being a kidney stone although I think this less likely at this point.  And that is in her to physical therapy for manual therapy as well as dry needling and strengthening.  Depending on relief would look at facet joint block above the area we were at last time.  Did prescribe muscle relaxer today Robaxin.    Meds & Orders:  Meds ordered this encounter  Medications   methocarbamol (ROBAXIN) 500 MG tablet    Sig: Take 1 tablet (500 mg total) by  mouth every 8 (eight) hours as needed for muscle spasms.    Dispense:  60 tablet    Refill:  0    Orders Placed This Encounter  Procedures   Ambulatory referral to Physical Therapy    Follow-up: No follow-ups on file.   Procedures: No procedures performed      Clinical History: CLINICAL DATA:  Low back pain radiating into legs, right-greater-than-left   EXAM: MRI LUMBAR SPINE WITHOUT CONTRAST   TECHNIQUE: Multiplanar, multisequence MR imaging of the lumbar spine was performed. No intravenous contrast was administered.   COMPARISON:  02/03/2014   FINDINGS: Segmentation:  Standard.   Alignment:  Mild levocurvature.  No significant listhesis.   Vertebrae: No acute fracture  or suspicious osseous lesion. Endplate degenerative changes at L3-L5.   Conus medullaris and cauda equina: Conus extends to the L1 level. Conus and cauda equina appear normal.   Paraspinal and other soft tissues: No acute finding.   Disc levels:   T12-L1: No significant disc bulge. No spinal canal stenosis or neural foraminal narrowing.   L1-L2: Minimal disc bulge. Mild facet arthropathy. No spinal canal stenosis or neural foraminal narrowing.   L2-L3: Minimal disc bulge with central annular fissure. Mild facet arthropathy. No spinal canal stenosis or neural foraminal narrowing.   L3-L4: Minimal disc bulge with small left foraminal protrusion. Mild facet arthropathy. No spinal canal stenosis or neural foraminal narrowing.   L4-L5: Minimal disc bulge. Mild facet arthropathy. No spinal canal stenosis or neural foraminal narrowing.   L5-S1: Minimal disc bulge. Mild facet arthropathy. No spinal canal stenosis or neural foraminal narrowing.   IMPRESSION: Mild degenerative changes without spinal canal stenosis or neural foraminal narrowing.     Electronically Signed   By: Wiliam Ke M.D.   On: 03/27/2022 02:55   She reports that she quit smoking about 12 years ago. Her smoking use included cigarettes. She started smoking about 22 years ago. She has never used smokeless tobacco. No results for input(s): "HGBA1C", "LABURIC" in the last 8760 hours.  Objective:  VS:  HT:    WT:   BMI:     BP:   HR: bpm  TEMP: ( )  RESP:  Physical Exam Vitals and nursing note reviewed.  Constitutional:      General: She is not in acute distress.    Appearance: Normal appearance. She is well-developed. She is not ill-appearing.  HENT:     Head: Normocephalic and atraumatic.     Right Ear: External ear normal.     Left Ear: External ear normal.  Eyes:     Extraocular Movements: Extraocular movements intact.     Conjunctiva/sclera: Conjunctivae normal.     Pupils: Pupils are equal,  round, and reactive to light.  Cardiovascular:     Rate and Rhythm: Normal rate.     Pulses: Normal pulses.  Pulmonary:     Effort: Pulmonary effort is normal. No respiratory distress.  Abdominal:     General: There is no distension.     Palpations: Abdomen is soft.  Musculoskeletal:        General: Tenderness present.     Cervical back: Neck supple.     Right lower leg: No edema.     Left lower leg: No edema.     Comments: Patient has good distal strength with no pain over the greater trochanters.  No clonus or focal weakness.  She has focal trigger point and taut band in the right lateral lumbar area.  This  may be trigger point in the latissimus dorsi and perhaps quadratus lumborum but it would be the proximal end of that.  This did reproduce some of her pain.  She does get some pain with extension and facet loading.  No pain with hip rotation.  Skin:    General: Skin is warm and dry.     Findings: No erythema, lesion or rash.  Neurological:     General: No focal deficit present.     Mental Status: She is alert and oriented to person, place, and time.     Cranial Nerves: No cranial nerve deficit.     Sensory: No sensory deficit.     Motor: No weakness or abnormal muscle tone.     Coordination: Coordination normal.     Gait: Gait normal.  Psychiatric:        Mood and Affect: Mood normal.        Behavior: Behavior normal.     Ortho Exam  Imaging: No results found.  Past Medical/Family/Surgical/Social History: Medications & Allergies reviewed per EMR, new medications updated. Patient Active Problem List   Diagnosis Date Noted   Upper airway cough syndrome 01/01/2022   Status post laparoscopic hysterectomy 11/06/2021   At risk for depression 03/15/2021   At risk for deficient intake of food 12/19/2020   Prediabetes 12/19/2020   At risk for malnutrition 11/09/2020   SOBOE (shortness of breath on exertion) 10/13/2020   Fatigue 10/13/2020   Other hyperlipidemia 10/13/2020    OSA on CPAP 10/13/2020   Vitamin D deficiency 10/13/2020   Gastroesophageal reflux disease 10/13/2020   Mood disorder (HCC), with emotional eating 10/13/2020   At risk for heart disease 10/13/2020   Ingrown toenail 05/17/2020   Plantar fasciitis 05/17/2020   Social anxiety disorder    Past Medical History:  Diagnosis Date   Acid reflux    Anxiety    Back pain    Bladder prolapse, female, acquired    C. 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 Lenard Forth. Patient uses cpap nightly per pt on 10/26/21.   SOBOE (shortness of breath on exertion) 10/13/2020   per MD note from Dr. [facility],  SOBOE likely related to obesity & exericise induced.   Social anxiety disorder 1998   Swallowing difficulty    feeling of fullness in upper chest, resolved as of 10/26/2021 per pt   Swelling of both lower extremities    hx of left ankle swelling and knee arthritis   Vitamin D deficiency    Wears contact lenses    Family History  Problem Relation Age of Onset   Cancer Father        lung   Thyroid disease Father    Cancer Brother        testicular cancer   Osteoarthritis Mother    Migraines Mother  Thyroid disease Mother    Depression Mother    Anxiety disorder Mother    Obesity Mother    Osteoarthritis Maternal Grandmother    Hypertension Paternal Grandmother    Stroke Paternal Grandmother    Past Surgical History:  Procedure Laterality Date   ANTERIOR  (CYSTOCELE) AND POSTERIOR REPAIR (RECTOCELE) WITH XENFORM GRAFT AND SACROSPINOUS FIXATION N/A 11/06/2021   Procedure: ANTERIOR AND POSTERIOR COLPORRHAPHY;  Surgeon: Patton Salles, MD;  Location: [facility];  Service: Gynecology;  Laterality: N/A;   APPENDECTOMY  1972   BLADDER SUSPENSION N/A 11/06/2021   Procedure: TRANSVAGINAL TAPE EXACT MIDURETHRAL;  Surgeon: Patton Salles, MD;  Location: [facility];  Service: Gynecology;  Laterality: N/A;   BREAST ENHANCEMENT SURGERY  10/08/2006   [facility] Odis Luster)   COLONOSCOPY     approximately 2014, had previous colonoscopy 5 years ealier   CYSTOSCOPY N/A 11/06/2021   Procedure: CYSTOSCOPY;  Surgeon: Patton Salles, MD;  Location: [facility];  Service: Gynecology;  Laterality: N/A;   DG BARIUM SWALLOW ([facility] HX)     around 2022   TOTAL LAPAROSCOPIC HYSTERECTOMY WITH SALPINGECTOMY Bilateral 11/06/2021   Procedure: TOTAL LAPAROSCOPIC HYSTERECTOMY WITH BILATERAL SALPINGOOOPHORECTOMY.;  Surgeon: Patton Salles, MD;  Location: [facility];  Service: Gynecology;  Laterality: Bilateral;   TUBAL LIGATION  01/1995   Social History   Occupational History   Not on file  Tobacco Use   Smoking status: Former    Current packs/day: 0.00    Types: Cigarettes    Start date: 2002    Quit date: 2012    Years since quitting: 12.8   Smokeless tobacco: Never  Vaping Use   Vaping status: Never Used  Substance and Sexual Activity   Alcohol use: Yes    Comment: 1-3 glasses of wine per month   Drug use: No   Sexual activity: Yes    Partners: Male    Birth control/protection: Surgical, Post-menopausal    Comment: BTL

## 2024-02-03 ENCOUNTER — Telehealth: Payer: Self-pay | Admitting: Physical Medicine and Rehabilitation

## 2024-02-03 NOTE — Telephone Encounter (Signed)
 Pt called requesting an appt for back injection. Pt last injection 11/6//24. Please call pt at [phone].

## 2024-02-12 ENCOUNTER — Ambulatory Visit: Admitting: Physical Medicine and Rehabilitation

## 2024-02-12 ENCOUNTER — Encounter: Payer: Self-pay | Admitting: Physical Medicine and Rehabilitation

## 2024-02-12 DIAGNOSIS — M47819 Spondylosis without myelopathy or radiculopathy, site unspecified: Secondary | ICD-10-CM | POA: Diagnosis not present

## 2024-02-12 DIAGNOSIS — G8929 Other chronic pain: Secondary | ICD-10-CM | POA: Diagnosis not present

## 2024-02-12 DIAGNOSIS — M47816 Spondylosis without myelopathy or radiculopathy, lumbar region: Secondary | ICD-10-CM | POA: Diagnosis not present

## 2024-02-12 DIAGNOSIS — M545 Low back pain, unspecified: Secondary | ICD-10-CM

## 2024-02-12 NOTE — Progress Notes (Signed)
 Core Outcome Measures Index (COMI) Back Score  Average Pain 5  COMI Score 40 %

## 2024-02-12 NOTE — Progress Notes (Signed)
 Lisa Wade - 63 y.o. female MRN [id number]  Date of birth: 20-Nov-1960  Office Visit Note: Visit Date: 02/12/2024 PCP: Candise Chambers, MD Referred by: Candise Chambers, MD  Subjective: Chief Complaint  Patient presents with   Lower Back - Pain   HPI: Lisa Wade is a 63 y.o. female who comes in today for evaluation of chronic, worsening and severe bilateral lower back pain, right greater than left. Pain ongoing for several years. Her pain is constant, worsens with standing, activity and bending. She describes pain as stiff and sore sensation, currently rates as 5 out of 10. Some relief of pain with home exercise regimen, heat/ice, rest and use of medications. Does take Meloxicam  and Tylenol  as needed. No history of formal physical therapy. Lumbar MRI imaging from 2023 shows mild degenerative changes without spinal canal stenosis or neural foraminal narrowing. She underwent right L5-S1 facet joint injection in our office on 06/17/2023, reports greater than 80% relief of pain for over 2 months. She does work full time as Producer, television/film/video, reports difficulty standing for long periods, has to take breaks to sit frequently. Patient denies focal weakness, numbness and tingling. No recent trauma or falls.       Review of Systems  Musculoskeletal:  Positive for back pain.  Neurological:  Negative for tingling, sensory change, focal weakness and weakness.  All other systems reviewed and are negative.  Otherwise per HPI.  Assessment & Plan: Visit Diagnoses:    ICD-10-CM   1. Chronic bilateral low back pain without sciatica  M54.50 Ambulatory referral to Physical Medicine Rehab   G89.29 Ambulatory referral to Physical Therapy    2. Spondylosis without myelopathy or radiculopathy  M47.819 Ambulatory referral to Physical Medicine Rehab    Ambulatory referral to Physical Therapy    3. Facet arthropathy, lumbar  M47.816 Ambulatory referral to Physical Medicine Rehab    Ambulatory referral to  Physical Therapy       Plan: Findings:  Chronic, worsening and severe bilateral axial back pain, right greater than left. Patient continues to have severe pain despite good conservative therapies such as home exercise regimen, rest and use of medications. Patients clinical presentation and exam are consistent with facet mediated pain. She does have pain with lumbar extension on exam today. There is mild multi level degenerative changes. We discussed treatment plan in detail today. Next step is to perform diagnostic and hopefully therapeutic bilateral L5-S1 facet joint injection under fluoroscopic guidance. I also placed order for short course of formal physical therapy. I do think she would benefit from core strengthening.     Meds & Orders: No orders of the defined types were placed in this encounter.   Orders Placed This Encounter  Procedures   Ambulatory referral to Physical Medicine Rehab   Ambulatory referral to Physical Therapy    Follow-up: Return for Bilateral L5-S1 facet joint injections.   Procedures: No procedures performed      Clinical History: CLINICAL DATA:  Low back pain radiating into legs, right-greater-than-left   EXAM: MRI LUMBAR SPINE WITHOUT CONTRAST   TECHNIQUE: Multiplanar, multisequence MR imaging of the lumbar spine was performed. No intravenous contrast was administered.   COMPARISON:  02/03/2014   FINDINGS: Segmentation:  Standard.   Alignment:  Mild levocurvature.  No significant listhesis.   Vertebrae: No acute fracture or suspicious osseous lesion. Endplate degenerative changes at L3-L5.   Conus medullaris and cauda equina: Conus extends to the L1 level. Conus and cauda equina appear normal.  Paraspinal and other soft tissues: No acute finding.   Disc levels:   T12-L1: No significant disc bulge. No spinal canal stenosis or neural foraminal narrowing.   L1-L2: Minimal disc bulge. Mild facet arthropathy. No spinal canal stenosis or  neural foraminal narrowing.   L2-L3: Minimal disc bulge with central annular fissure. Mild facet arthropathy. No spinal canal stenosis or neural foraminal narrowing.   L3-L4: Minimal disc bulge with small left foraminal protrusion. Mild facet arthropathy. No spinal canal stenosis or neural foraminal narrowing.   L4-L5: Minimal disc bulge. Mild facet arthropathy. No spinal canal stenosis or neural foraminal narrowing.   L5-S1: Minimal disc bulge. Mild facet arthropathy. No spinal canal stenosis or neural foraminal narrowing.   IMPRESSION: Mild degenerative changes without spinal canal stenosis or neural foraminal narrowing.     Electronically Signed   By: Zoila Hines M.D.   On: 03/27/2022 02:55   She reports that she quit smoking about 13 years ago. Her smoking use included cigarettes. She started smoking about 23 years ago. She has never used smokeless tobacco. No results for input(s): "HGBA1C", "LABURIC" in the last 8760 hours.  Objective:  VS:  HT:    WT:   BMI:     BP:   HR: bpm  TEMP: ( )  RESP:  Physical Exam Vitals and nursing note reviewed.  HENT:     Head: Normocephalic and atraumatic.     Right Ear: External ear normal.     Left Ear: External ear normal.     Nose: Nose normal.     Mouth/Throat:     Mouth: Mucous membranes are moist.  Eyes:     Extraocular Movements: Extraocular movements intact.  Cardiovascular:     Rate and Rhythm: Normal rate.     Pulses: Normal pulses.  Pulmonary:     Effort: Pulmonary effort is normal.  Abdominal:     General: Abdomen is flat. There is no distension.  Musculoskeletal:        General: Tenderness present.     Cervical back: Normal range of motion.     Comments: Patient rises from seated position to standing without difficulty. Pain noted with facet loading and lumbar extension. 5/5 strength noted with bilateral hip flexion, knee flexion/extension, ankle dorsiflexion/plantarflexion and EHL. No clonus noted  bilaterally. No pain upon palpation of greater trochanters. No pain with internal/external rotation of bilateral hips. Sensation intact bilaterally. Negative slump test bilaterally. Ambulates without aid, gait steady.     Skin:    General: Skin is warm and dry.     Capillary Refill: Capillary refill takes less than 2 seconds.  Neurological:     General: No focal deficit present.     Mental Status: She is alert and oriented to person, place, and time.  Psychiatric:        Mood and Affect: Mood normal.        Behavior: Behavior normal.     Ortho Exam  Imaging: No results found.  Past Medical/Family/Surgical/Social History: Medications & Allergies reviewed per EMR, new medications updated. Patient Active Problem List   Diagnosis Date Noted   Upper airway cough syndrome 01/01/2022   Status post laparoscopic hysterectomy 11/06/2021   At risk for depression 03/15/2021   At risk for deficient intake of food 12/19/2020   Prediabetes 12/19/2020   At risk for malnutrition 11/09/2020   SOBOE (shortness of breath on exertion) 10/13/2020   Fatigue 10/13/2020   Other hyperlipidemia 10/13/2020   OSA on CPAP 10/13/2020  Vitamin D  deficiency 10/13/2020   Gastroesophageal reflux disease 10/13/2020   Mood disorder (HCC), with emotional eating 10/13/2020   At risk for heart disease 10/13/2020   Ingrown toenail 05/17/2020   Plantar fasciitis 05/17/2020   Social anxiety disorder    Past Medical History:  Diagnosis Date   Acid reflux    Anxiety    Back pain    Bladder prolapse, female, acquired    C. difficile colitis    from cephalosporins   Chondromalacia    COVID 08/12/2021   mild symptoms   Dysplasia of cervix, low grade (CIN 1) 1991   Fibroids    uterine   Heartburn    High cholesterol    History of COVID-19 06/05/2021   History of prediabetes    last A1C on 10/04/21 was 5.6   HSV-1 infection 2010   cold sores   Lumbar spondylosis    Mild depression    Palpitations     Plantar fasciitis, bilateral    Simple endometrial hyperplasia without atypia 08/2021   Sleep apnea 04/02/2017   home sleep study, Pulmonologist LOV 08/23/2017 with Dr. Mardeen Shackleton. Patient uses cpap nightly per pt on 10/26/21.   SOBOE (shortness of breath on exertion) 10/13/2020   per MD note from [facility],  SOBOE likely related to obesity & exericise induced.   Social anxiety disorder 1998   Swallowing difficulty    feeling of fullness in upper chest, resolved as of 10/26/2021 per pt   Swelling of both lower extremities    hx of left ankle swelling and knee arthritis   Vitamin D  deficiency    Wears contact lenses    Family History  Problem Relation Age of Onset   Cancer Father        lung   Thyroid disease Father    Cancer Brother        testicular cancer   Osteoarthritis Mother    Migraines Mother    Thyroid disease Mother    Depression Mother    Anxiety disorder Mother    Obesity Mother    Osteoarthritis Maternal Grandmother    Hypertension Paternal Grandmother    Stroke Paternal Grandmother    Past Surgical History:  Procedure Laterality Date   ANTERIOR (CYSTOCELE) AND POSTERIOR REPAIR (RECTOCELE) WITH XENFORM GRAFT AND SACROSPINOUS FIXATION N/A 11/06/2021   Procedure: ANTERIOR AND POSTERIOR COLPORRHAPHY;  Surgeon: Greta Leatherwood, MD;  Location: [facility];  Service: Gynecology;  Laterality: N/A;   APPENDECTOMY  1972   BLADDER SUSPENSION N/A 11/06/2021   Procedure: TRANSVAGINAL TAPE EXACT MIDURETHRAL;  Surgeon: Greta Leatherwood, MD;  Location: [facility];  Service: Gynecology;  Laterality: N/A;   BREAST ENHANCEMENT SURGERY  10/08/2006   [facility])   COLONOSCOPY     approximately 2014, had previous colonoscopy 5 years ealier   CYSTOSCOPY N/A 11/06/2021   Procedure: CYSTOSCOPY;  Surgeon: Greta Leatherwood, MD;  Location: [facility];  Service: Gynecology;  Laterality: N/A;   DG  BARIUM SWALLOW ([facility] HX)     around 2022   TOTAL LAPAROSCOPIC HYSTERECTOMY WITH SALPINGECTOMY Bilateral 11/06/2021   Procedure: TOTAL LAPAROSCOPIC HYSTERECTOMY WITH BILATERAL SALPINGOOOPHORECTOMY.;  Surgeon: Greta Leatherwood, MD;  Location: [facility];  Service: Gynecology;  Laterality: Bilateral;   TUBAL LIGATION  01/1995   Social History   Occupational History   Not on file  Tobacco Use   Smoking status:  Former    Current packs/day: 0.00    Types: Cigarettes    Start date: 2002    Quit date: 2012    Years since quitting: 13.3   Smokeless tobacco: Never  Vaping Use   Vaping status: Never Used  Substance and Sexual Activity   Alcohol use: Yes    Comment: 1-3 glasses of wine per month   Drug use: No   Sexual activity: Yes    Partners: Male    Birth control/protection: Surgical, Post-menopausal    Comment: BTL

## 2024-02-12 NOTE — Progress Notes (Signed)
 Pain Scale   Average Pain 2 Patient advising she has constant lower back pain and some days are better than others. Patient advised she never did PT due to missing the message from PT and then time got away from her.        +Driver, -BT, -Dye Allergies.

## 2024-02-16 ENCOUNTER — Emergency Department (HOSPITAL_BASED_OUTPATIENT_CLINIC_OR_DEPARTMENT_OTHER)

## 2024-02-16 ENCOUNTER — Other Ambulatory Visit: Payer: Self-pay

## 2024-02-16 ENCOUNTER — Encounter (HOSPITAL_BASED_OUTPATIENT_CLINIC_OR_DEPARTMENT_OTHER): Payer: Self-pay | Admitting: Emergency Medicine

## 2024-02-16 ENCOUNTER — Observation Stay (HOSPITAL_BASED_OUTPATIENT_CLINIC_OR_DEPARTMENT_OTHER)
Admission: EM | Admit: 2024-02-16 | Discharge: 2024-02-17 | Disposition: A | Discharge status: Home / Self Care | Attending: Family Medicine | Admitting: Family Medicine

## 2024-02-16 DIAGNOSIS — Z794 Long term (current) use of insulin: Secondary | ICD-10-CM | POA: Diagnosis not present

## 2024-02-16 DIAGNOSIS — Z8616 Personal history of COVID-19: Secondary | ICD-10-CM | POA: Insufficient documentation

## 2024-02-16 DIAGNOSIS — R7303 Prediabetes: Secondary | ICD-10-CM | POA: Diagnosis not present

## 2024-02-16 DIAGNOSIS — Z79899 Other long term (current) drug therapy: Secondary | ICD-10-CM | POA: Insufficient documentation

## 2024-02-16 DIAGNOSIS — R413 Other amnesia: Principal | ICD-10-CM | POA: Diagnosis present

## 2024-02-16 DIAGNOSIS — J45909 Unspecified asthma, uncomplicated: Secondary | ICD-10-CM | POA: Insufficient documentation

## 2024-02-16 DIAGNOSIS — E785 Hyperlipidemia, unspecified: Secondary | ICD-10-CM | POA: Diagnosis not present

## 2024-02-16 DIAGNOSIS — Z87891 Personal history of nicotine dependence: Secondary | ICD-10-CM | POA: Diagnosis not present

## 2024-02-16 DIAGNOSIS — E7849 Other hyperlipidemia: Secondary | ICD-10-CM | POA: Diagnosis present

## 2024-02-16 DIAGNOSIS — F418 Other specified anxiety disorders: Secondary | ICD-10-CM | POA: Insufficient documentation

## 2024-02-16 DIAGNOSIS — G454 Transient global amnesia: Principal | ICD-10-CM | POA: Insufficient documentation

## 2024-02-16 DIAGNOSIS — F4010 Social phobia, unspecified: Secondary | ICD-10-CM | POA: Diagnosis present

## 2024-02-16 DIAGNOSIS — G4733 Obstructive sleep apnea (adult) (pediatric): Secondary | ICD-10-CM

## 2024-02-16 LAB — PROTIME-INR
INR: 0.9 (ref 0.8–1.2)
Prothrombin Time: 12.6 s (ref 11.4–15.2)

## 2024-02-16 LAB — DIFFERENTIAL
Abs Immature Granulocytes: 0.02 10*3/uL (ref 0.00–0.07)
Basophils Absolute: 0.1 10*3/uL (ref 0.0–0.1)
Basophils Relative: 1 %
Eosinophils Absolute: 0.1 10*3/uL (ref 0.0–0.5)
Eosinophils Relative: 2 %
Immature Granulocytes: 0 %
Lymphocytes Relative: 36 %
Lymphs Abs: 2.3 10*3/uL (ref 0.7–4.0)
Monocytes Absolute: 0.7 10*3/uL (ref 0.1–1.0)
Monocytes Relative: 11 %
Neutro Abs: 3.2 10*3/uL (ref 1.7–7.7)
Neutrophils Relative %: 50 %

## 2024-02-16 LAB — COMPREHENSIVE METABOLIC PANEL WITH GFR
ALT: 26 U/L (ref 0–44)
AST: 27 U/L (ref 15–41)
Albumin: 4.6 g/dL (ref 3.5–5.0)
Alkaline Phosphatase: 72 U/L (ref 38–126)
Anion gap: 12 (ref 5–15)
BUN: 11 mg/dL (ref 6–20)
CO2: 23 mmol/L (ref 22–32)
Calcium: 9.8 mg/dL (ref 8.9–10.3)
Chloride: 106 mmol/L (ref 98–111)
Creatinine, Ser: 0.78 mg/dL (ref 0.44–1.00)
GFR, Estimated: >60 mL/min (ref >60)
Glucose, Bld: 102 mg/dL — ABNORMAL HIGH (ref 70–99)
Potassium: 4.1 mmol/L (ref 3.5–5.1)
Sodium: 142 mmol/L (ref 135–145)
Total Bilirubin: 0.3 mg/dL (ref 0.0–1.2)
Total Protein: 7.2 g/dL (ref 6.5–8.1)

## 2024-02-16 LAB — CBC
HCT: 41.5 % (ref 36.0–46.0)
Hemoglobin: 14.2 g/dL (ref 12.0–15.0)
MCH: 29.2 pg (ref 26.0–34.0)
MCHC: 34.2 g/dL (ref 30.0–36.0)
MCV: 85.2 fL (ref 80.0–100.0)
Platelets: 299 10*3/uL (ref 150–400)
RBC: 4.87 MIL/uL (ref 3.87–5.11)
RDW: 12.6 % (ref 11.5–15.5)
WBC: 6.4 10*3/uL (ref 4.0–10.5)
nRBC: 0 % (ref 0.0–0.2)

## 2024-02-16 LAB — URINE DRUG SCREEN
Amphetamines: NOT DETECTED
Barbiturates: NOT DETECTED
Benzodiazepines: NOT DETECTED
Cocaine: NOT DETECTED
Fentanyl: NOT DETECTED
Methadone Scn, Ur: NOT DETECTED
Opiates: NOT DETECTED
Tetrahydrocannabinol: NOT DETECTED

## 2024-02-16 LAB — APTT: aPTT: 27 s (ref 24–36)

## 2024-02-16 LAB — CBG MONITORING, ED: Glucose-Capillary: 119 mg/dL — ABNORMAL HIGH (ref 70–99)

## 2024-02-16 LAB — ETHANOL: Alcohol, Ethyl (B): <15 mg/dL (ref <15)

## 2024-02-16 NOTE — Progress Notes (Signed)
 Plan of Care Note for accepted transfer   Patient name: Lisa Wade NGE:[id number] DOB: 21-Jan-1961  Facility requesting transfer: [facility] ED Requesting Provider: Dr. Isaiah Marc Facility course: 63 year old female with history of hyperlipidemia presented to the ED with episode of memory loss/global amnesia.  Last known well at 4 PM today.  CT head negative for acute intracranial abnormality.  No significant lab abnormalities on CBC and CMP.  Ethanol level <15.  UDS pending.  ED physician discussed with neurologist and they recommended admission for brain MRI and EEG.  Plan of care: The patient is accepted for admission to Telemetry unit at [facility].  [facility] will assume care on arrival to accepting facility. Until arrival, care as per EDP. However, [facility] available 24/7 for questions and assistance.  Check [email or web address] for on-call coverage.  Nursing staff, please call [facility] Admits & Consults System-Wide number under Amion on patient's arrival so appropriate admitting provider can evaluate the pt.

## 2024-02-16 NOTE — ED Provider Notes (Signed)
 [facility] Provider Note  CSN: [id number] Arrival date & time: 02/16/24 1919  Chief Complaint(s) Code Stroke  HPI Lisa Wade is a 63 y.o. female with history of hyperlipidemia presenting to the emergency department with episode of memory loss.  Patient brought to the emergency department by husband.  He last saw her normal around 4 PM.  He came back inside after doing some gardening around 6 PM and she seemed to be confused, was asking questions like "is today Mother's Day", "what did we do today".  Speech was normal.  Patient denies any pain or discomfort, headaches, chest pain, abdominal pain.  No trouble swallowing or visual changes.  No fevers or chills.  No back pain.  Has reports that is very abnormal for her.  Given timeline patient was activated by triage nurse as code stroke.   Past Medical History Past Medical History:  Diagnosis Date   Acid reflux    Anxiety    Back pain    Bladder prolapse, female, acquired    C. 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 Mardeen Shackleton. Patient uses cpap nightly per pt on 10/26/21.   SOBOE (shortness of breath on exertion) 10/13/2020   per MD note from [facility],  SOBOE likely related to obesity & exericise induced.   Social anxiety disorder 1998   Swallowing difficulty    feeling of fullness in upper chest, resolved as of 10/26/2021 per pt   Swelling of both lower  extremities    hx of left ankle swelling and knee arthritis   Vitamin D  deficiency    Wears contact lenses    Patient Active Problem List   Diagnosis Date Noted   Amnesia 02/16/2024   Upper airway cough syndrome 01/01/2022   Status post laparoscopic hysterectomy 11/06/2021   At risk for depression 03/15/2021   At risk for deficient intake of food 12/19/2020   Prediabetes 12/19/2020   At risk for malnutrition 11/09/2020   SOBOE (shortness of breath on exertion) 10/13/2020   Fatigue 10/13/2020   Other hyperlipidemia 10/13/2020   OSA on CPAP 10/13/2020   Vitamin D  deficiency 10/13/2020   Gastroesophageal reflux disease 10/13/2020   Mood disorder (HCC), with emotional eating 10/13/2020   At risk for heart disease 10/13/2020   Ingrown toenail 05/17/2020   Plantar fasciitis 05/17/2020   Social anxiety disorder    Home Medication(s) Prior to Admission medications   Medication Sig Start Date End Date Taking? Authorizing Provider  albuterol (VENTOLIN HFA) 108 (90 Base) MCG/ACT inhaler SMARTSIG:1-2 Puff(s) Via Inhaler Every 4 Hours PRN 03/14/22   [provider]  ciprofloxacin  (CIPRO ) 250 MG tablet Take 250 mg by mouth 2 (two) times daily. 08/08/23   [provider]  escitalopram  (LEXAPRO ) 20 MG tablet Take 20 mg by mouth daily.  [provider]  methocarbamol  (ROBAXIN ) 500 MG tablet Take 1 tablet (500 mg total) by mouth every 8 (eight) hours as needed for muscle spasms. 08/14/23   Bridget Campion, MD  oxyCODONE  (OXY IR/ROXICODONE ) 5 MG immediate release tablet Take 5 mg by mouth every 4 (four) hours as needed. 03/16/22   [provider]  rosuvastatin  (CRESTOR ) 10 MG tablet SMARTSIG:1 Tablet(s) By Mouth Every Evening 03/18/20   [provider]  tirzepatide (MOUNJARO) 5 MG/0.5ML Pen Inject 5 mg into the skin once a week.    [provider]  triamcinolone  cream (KENALOG ) 0.1 % daily as needed. 03/24/18   [provider]  valACYclovir   (VALTREX ) 500 MG tablet Take one tablet (500 mg) by mouth twice daily for 3 days for an outbreak. 06/21/22   Greta Leatherwood, MD                                                                                                                                    Past Surgical History Past Surgical History:  Procedure Laterality Date   ANTERIOR (CYSTOCELE) AND POSTERIOR REPAIR (RECTOCELE) WITH XENFORM GRAFT AND SACROSPINOUS FIXATION N/A 11/06/2021   Procedure: ANTERIOR AND POSTERIOR COLPORRHAPHY;  Surgeon: Greta Leatherwood, MD;  Location[facility];  Service: Gynecology;  Laterality: N/A;   APPENDECTOMY  1972   BLADDER SUSPENSION N/A 11/06/2021   Procedure: TRANSVAGINAL TAPE EXACT MIDURETHRAL;  Surgeon: Greta Leatherwood, MD;  Location: [facility];  Service: Gynecology;  Laterality: N/A;   BREAST ENHANCEMENT SURGERY  10/08/2006   [facility] Lulas)   COLONOSCOPY     approximately 2014, had previous colonoscopy 5 years ealier   CYSTOSCOPY N/A 11/06/2021   Procedure: CYSTOSCOPY;  Surgeon: Greta Leatherwood, MD;  Location: [facility];  Service: Gynecology;  Laterality: N/A;   DG BARIUM SWALLOW ([facility] HX)     around 2022   TOTAL LAPAROSCOPIC HYSTERECTOMY WITH SALPINGECTOMY Bilateral 11/06/2021   Procedure: TOTAL LAPAROSCOPIC HYSTERECTOMY WITH BILATERAL SALPINGOOOPHORECTOMY.;  Surgeon: Greta Leatherwood, MD;  Location: [facility];  Service: Gynecology;  Laterality: Bilateral;   TUBAL LIGATION  01/1995   Family History Family History  Problem Relation Age of Onset   Cancer Father        lung   Thyroid disease Father    Cancer Brother        testicular cancer   Osteoarthritis Mother    Migraines Mother    Thyroid disease Mother    Depression Mother    Anxiety disorder Mother    Obesity Mother    Osteoarthritis Maternal Grandmother    Hypertension Paternal Grandmother    Stroke Paternal  Grandmother     Social History Social History   Tobacco Use   Smoking status: Former    Current packs/day: 0.00    Types: Cigarettes  Start date: 2002    Quit date: 2012    Years since quitting: 13.3   Smokeless tobacco: Never  Vaping Use   Vaping status: Never Used  Substance Use Topics   Alcohol use: Yes    Comment: 1-3 glasses of wine per month   Drug use: No   Allergies Iodinated contrast media and Cephalosporins  Review of Systems Review of Systems  All other systems reviewed and are negative.   Physical Exam Vital Signs  I have reviewed the triage vital signs BP 124/81   Pulse (!) 56   Temp 98 F (36.7 C)   Resp (!) 23   Ht 5\' 4"  (1.626 m)   Wt 82.1 kg   LMP 07/08/2020 (Approximate)   SpO2 100%   BMI 31.07 kg/m  Physical Exam Vitals and nursing note reviewed.  Constitutional:      General: She is not in acute distress.    Appearance: She is well-developed.  HENT:     Head: Normocephalic and atraumatic.     Mouth/Throat:     Mouth: Mucous membranes are moist.  Eyes:     Pupils: Pupils are equal, round, and reactive to light.  Cardiovascular:     Rate and Rhythm: Normal rate and regular rhythm.     Heart sounds: No murmur heard. Pulmonary:     Effort: Pulmonary effort is normal. No respiratory distress.     Breath sounds: Normal breath sounds.  Abdominal:     General: Abdomen is flat.     Palpations: Abdomen is soft.     Tenderness: There is no abdominal tenderness.  Musculoskeletal:        General: No tenderness.     Right lower leg: No edema.     Left lower leg: No edema.  Skin:    General: Skin is warm and dry.  Neurological:     General: No focal deficit present.     Mental Status: She is alert.     Comments: Cranial nerves II through XII intact, strength 5 out of 5 in the bilateral upper and lower extremities, no sensory deficit to light touch, no dysmetria on finger-nose-finger testing.  Speech fluent, patient endorses some  short-term memory loss, but remember she went to church this morning, had lunch with husband and family.  Psychiatric:        Mood and Affect: Mood normal.        Behavior: Behavior normal.     ED Results and Treatments Labs (all labs ordered are listed, but only abnormal results are displayed) Labs Reviewed  COMPREHENSIVE METABOLIC PANEL WITH GFR - Abnormal; Notable for the following components:      Result Value   Glucose, Bld 102 (*)    All other components within normal limits  CBG MONITORING, ED - Abnormal; Notable for the following components:   Glucose-Capillary 119 (*)    All other components within normal limits  ETHANOL  PROTIME-INR  APTT  CBC  DIFFERENTIAL  URINE DRUG SCREEN  Radiology CT HEAD CODE STROKE WO CONTRAST Result Date: 02/16/2024 CLINICAL DATA:  Code stroke.  Altered mental status. EXAM: CT HEAD WITHOUT CONTRAST TECHNIQUE: Contiguous axial images were obtained from the base of the skull through the vertex without intravenous contrast. RADIATION DOSE REDUCTION: This exam was performed according to the departmental dose-optimization program which includes automated exposure control, adjustment of the mA and/or kV according to patient size and/or use of iterative reconstruction technique. COMPARISON:  None Available. FINDINGS: Brain: No acute intracranial hemorrhage. No CT evidence of acute infarct. Nonspecific hypoattenuation in the periventricular and subcortical white matter favored to reflect chronic microvascular ischemic changes. No edema, mass effect, or midline shift. The basilar cisterns are patent. Ventricles: The ventricles are normal. Vascular: No hyperdense vessel or unexpected calcification. Skull: No acute or aggressive finding. Orbits: Orbits are symmetric. Sinuses: Mucosal thickening in the alveolar recess of the left maxillary sinus.  Other: Mastoid air cells are clear. ASPECTS [facility] Stroke Program Early CT Score) - Ganglionic level infarction (caudate, lentiform nuclei, internal capsule, insula, M1-M3 cortex): 7 - Supraganglionic infarction (M4-M6 cortex): 3 Total score (0-10 with 10 being normal): 10 IMPRESSION: 1. No CT evidence of acute intracranial abnormality. 2. ASPECTS is 10 Results sent via epic message at the time of interpretation on 02/16/2024 at 7:40 pm to provider [facility] . Electronically Signed   By: Denny Flack M.D.   On: 02/16/2024 20:02    Pertinent labs & imaging results that were available during my care of the patient were reviewed by me and considered in my medical decision making (see MDM for details).  Medications Ordered in ED Medications - No data to display                                                                                                                                   Procedures .Critical Care  Performed by: Mordecai Applebaum, MD Authorized by: Mordecai Applebaum, MD   Critical care provider statement:    Critical care time (minutes):  30   Critical care was necessary to treat or prevent imminent or life-threatening deterioration of the following conditions:  CNS failure or compromise   Critical care was time spent personally by me on the following activities:  Development of treatment plan with patient or surrogate, discussions with consultants, evaluation of patient's response to treatment, examination of patient, ordering and review of laboratory studies, ordering and review of radiographic studies, ordering and performing treatments and interventions, pulse oximetry, re-evaluation of patient's condition and review of old charts   Care discussed with: admitting provider and accepting provider at another facility     (including critical care time)  Medical Decision Making / ED Course   MDM:  63 year old presenting to the emergency department with memory  loss.  Patient overall well-appearing, denies any complaints other than feeling confused.  Neurologic exam is reassuring.  Patient was evaluated as a code stroke activated  by nursing.  Patient was valued by neurology.  Symptoms per neurology seem most consistent with global amnesia, I agree with this.  TNK was considered however symptoms were to mild/alternative diagnosis more likely, no focal deficit.  CT head negative for any acute bleeding.  Dr. Doretta Gant with neurology recommends patient be admitted for further evaluation including MRI and EEG.  Will discuss with hospitalist.  Patient and family agreeable to this.  Memory does seem slightly better  Clinical Course as of 02/16/24 2049  Sun Feb 16, 2024  2048 Patient admitted by Dr. Michell Ahumada.  [WS]    Clinical Course User Index [WS] Isaiah Marc Dozier Genre, MD     Additional history obtained: -Additional history obtained from spouse -External records from outside source obtained and reviewed including: Chart review including previous notes, labs, imaging, consultation notes including prior notes    Lab Tests: -I ordered, reviewed, and interpreted labs.   The pertinent results include:   Labs Reviewed  COMPREHENSIVE METABOLIC PANEL WITH GFR - Abnormal; Notable for the following components:      Result Value   Glucose, Bld 102 (*)    All other components within normal limits  CBG MONITORING, ED - Abnormal; Notable for the following components:   Glucose-Capillary 119 (*)    All other components within normal limits  ETHANOL  PROTIME-INR  APTT  CBC  DIFFERENTIAL  URINE DRUG SCREEN    Notable for borderline hyperglycemia   EKG   EKG Interpretation Date/Time:  Sunday Feb 16 2024 19:27:52 EDT Ventricular Rate:  61 PR Interval:  181 QRS Duration:  107 QT Interval:  476 QTC Calculation: 480 R Axis:   -37  Text Interpretation: Sinus rhythm LVH with secondary repolarization abnormality Anteroseptal infarct, old Confirmed by Hiawatha Lout ([id number]) on 02/16/2024 7:42:29 PM         Imaging Studies ordered: I ordered imaging studies including CT head On my interpretation imaging demonstrates no acute process I independently visualized and interpreted imaging. I agree with the radiologist interpretation   Medicines ordered and prescription drug management: No orders of the defined types were placed in this encounter.   -I have reviewed the patients home medicines and have made adjustments as needed   Consultations Obtained: I requested consultation with the neurologist,  and discussed lab and imaging findings as well as pertinent plan - they recommend: admit for further workup   Cardiac Monitoring: The patient was maintained on a cardiac monitor.  I personally viewed and interpreted the cardiac monitored which showed an underlying rhythm of: NSR  Social Determinants of Health:  Diagnosis or treatment significantly limited by social determinants of health: obesity   Reevaluation: After the interventions noted above, I reevaluated the patient and found that their symptoms have improved  Co morbidities that complicate the patient evaluation  Past Medical History:  Diagnosis Date   Acid reflux    Anxiety    Back pain    Bladder prolapse, female, acquired    C. difficile colitis    from cephalosporins   Chondromalacia    COVID 08/12/2021   mild symptoms   Dysplasia of cervix, low grade (CIN 1) 1991   Fibroids    uterine   Heartburn    High cholesterol    History of COVID-19 06/05/2021   History of prediabetes    last A1C on 10/04/21 was 5.6   HSV-1 infection 2010   cold sores   Lumbar spondylosis    Mild depression    Palpitations  Plantar fasciitis, bilateral    Simple endometrial hyperplasia without atypia 08/2021   Sleep apnea 04/02/2017   home sleep study, Pulmonologist LOV 08/23/2017 with Dr. Mardeen Shackleton. Patient uses cpap nightly per pt on 10/26/21.   SOBOE (shortness of breath on  exertion) 10/13/2020   per MD note from [facility],  SOBOE likely related to obesity & exericise induced.   Social anxiety disorder 1998   Swallowing difficulty    feeling of fullness in upper chest, resolved as of 10/26/2021 per pt   Swelling of both lower extremities    hx of left ankle swelling and knee arthritis   Vitamin D  deficiency    Wears contact lenses       Dispostion: Disposition decision including need for hospitalization was considered, and patient admitted to the hospital.    Final Clinical Impression(s) / ED Diagnoses Final diagnoses:  Global amnesia     This chart was dictated using voice recognition software.  Despite best efforts to proofread,  errors can occur which can change the documentation meaning.    Mordecai Applebaum, MD 02/16/24 2049

## 2024-02-16 NOTE — Consult Note (Addendum)
 Code Stroke 1924: Code stroke cart activated at this time.   Pt with forgetfulness noted at 1800. Denies taking blood thinners. No previous hx of stroke. No weakness or speech difficulties. LKW 1600. Running code stroke per EDP at this time. Requested that staff pre-pull TNK at this time: NO   1933: Patient left for CT at this time.  1929: TSP paged at this time.  1935: Dr. Doretta Gant on camera at this time. Patient report and history provided.  1943: Patient returned from CT at this time.  1943: Dr. Doretta Gant performing neuro assessment.   NIH 0, MRS 0, No TNK, low suspicion for stroke   2010: NCCT imaging results provided to Dr. Doretta Gant via secure messaging. Acknowledged at 2024

## 2024-02-16 NOTE — Consult Note (Signed)
 NEUROLOGY TELESTROKE CONSULT NOTE   Date of service: Feb 16, 2024 Patient Name: JAMAREA TIGNER MRN:  [id number] DOB:  1961-03-28 Chief Complaint: acute short-term  Requesting Provider: Mordecai Applebaum, MD  Consult Participants: myself, patient, bedside RN, telestroke RN Location of the provider: [facility], [place] Location of the patient: [facility]  This consult was provided via telemedicine with 2-way video and audio communication. The patient/family was informed that care would be provided in this way and agreed to receive care in this manner.   History of Present Illness   This is a 63 yo woman with hx prediabetes who presents with short-term memory loss for which stroke code was activated in [facility] ED. last known well 1600 when husband reports that she was normal when he went out to go and do some yard work.  When she came back she had a funny look on her face and asked him if it was Mother's Day.  They have been celebrating Mother's Day all weekend so he was surprised to hear her asked this.  Since that time she is asked and about 30 times what the data is and if it is Mother's Day.  She seems surprised each time.  She has no focal deficits.  NIH stroke scale is 0.  She can name both of her parents as well as all of her children's birthdates and her own.  She is able to recall long-term memory without issue.  CT head showed no acute process on personal review.  TNK was not administered due to symptoms being consistent with transient global amnesia and NIH stroke scale of 0.   LKW: 1600 Modified rankin score: 0-Completely asymptomatic and back to baseline post- stroke IV Thrombolysis: no NIHSS = 0   ROS   Comprehensive ROS performed and pertinent positives documented in HPI   Past History   Past Medical History:  Diagnosis Date   Acid reflux    Anxiety    Back pain    Bladder prolapse, female, acquired    C. difficile colitis    from cephalosporins   Chondromalacia    COVID  08/12/2021   mild symptoms   Dysplasia of cervix, low grade (CIN 1) 1991   Fibroids    uterine   Heartburn    High cholesterol    History of COVID-19 06/05/2021   History of prediabetes    last A1C on 10/04/21 was 5.6   HSV-1 infection 2010   cold sores   Lumbar spondylosis    Mild depression    Palpitations    Plantar fasciitis, bilateral    Simple endometrial hyperplasia without atypia 08/2021   Sleep apnea 04/02/2017   home sleep study, Pulmonologist LOV 08/23/2017 with Dr. Mardeen Shackleton. Patient uses cpap nightly per pt on 10/26/21.   SOBOE (shortness of breath on exertion) 10/13/2020   per MD note from [facility],  SOBOE likely related to obesity & exericise induced.   Social anxiety disorder 1998   Swallowing difficulty    feeling of fullness in upper chest, resolved as of 10/26/2021 per pt   Swelling of both lower extremities    hx of left ankle swelling and knee arthritis   Vitamin D  deficiency    Wears contact lenses     Past Surgical History:  Procedure Laterality Date   ANTERIOR (CYSTOCELE) AND POSTERIOR REPAIR (RECTOCELE) WITH XENFORM GRAFT AND SACROSPINOUS FIXATION N/A 11/06/2021   Procedure: ANTERIOR AND POSTERIOR COLPORRHAPHY;  Surgeon: Jorie Newness, Colonel Dears  E, MD;  Location: [facility];  Service: Gynecology;  Laterality: N/A;   APPENDECTOMY  1972   BLADDER SUSPENSION N/A 11/06/2021   Procedure: TRANSVAGINAL TAPE EXACT MIDURETHRAL;  Surgeon: Greta Leatherwood, MD;  Location: [facility];  Service: Gynecology;  Laterality: N/A;   BREAST ENHANCEMENT SURGERY  10/08/2006   Saline Hobart Lulas)   COLONOSCOPY     approximately 2014, had previous colonoscopy 5 years ealier   CYSTOSCOPY N/A 11/06/2021   Procedure: CYSTOSCOPY;  Surgeon: Greta Leatherwood, MD;  Location: [facility];  Service: Gynecology;  Laterality: N/A;   DG BARIUM SWALLOW ([facility] HX)     around 2022   TOTAL LAPAROSCOPIC HYSTERECTOMY  WITH SALPINGECTOMY Bilateral 11/06/2021   Procedure: TOTAL LAPAROSCOPIC HYSTERECTOMY WITH BILATERAL SALPINGOOOPHORECTOMY.;  Surgeon: Greta Leatherwood, MD;  Location: [facility];  Service: Gynecology;  Laterality: Bilateral;   TUBAL LIGATION  01/1995    Family History: Family History  Problem Relation Age of Onset   Cancer Father        lung   Thyroid disease Father    Cancer Brother        testicular cancer   Osteoarthritis Mother    Migraines Mother    Thyroid disease Mother    Depression Mother    Anxiety disorder Mother    Obesity Mother    Osteoarthritis Maternal Grandmother    Hypertension Paternal Grandmother    Stroke Paternal Grandmother     Social History  reports that she quit smoking about 13 years ago. Her smoking use included cigarettes. She started smoking about 23 years ago. She has never used smokeless tobacco. She reports current alcohol use. She reports that she does not use drugs.  Allergies  Allergen Reactions   Iodinated Contrast Media Hives    1 single hive noted after CT contrast administration, MD M. Shogry recommended premedication with oral Benadryl  for future CT scans w/contrast.    Cephalosporins Diarrhea    Caused C. Diff per pt    Medications  No current facility-administered medications for this encounter.  Current Outpatient Medications:    albuterol (VENTOLIN HFA) 108 (90 Base) MCG/ACT inhaler, SMARTSIG:1-2 Puff(s) Via Inhaler Every 4 Hours PRN, Disp: , Rfl:    ciprofloxacin  (CIPRO ) 250 MG tablet, Take 250 mg by mouth 2 (two) times daily., Disp: , Rfl:    escitalopram  (LEXAPRO ) 20 MG tablet, Take 20 mg by mouth daily., Disp: , Rfl:    methocarbamol  (ROBAXIN ) 500 MG tablet, Take 1 tablet (500 mg total) by mouth every 8 (eight) hours as needed for muscle spasms., Disp: 60 tablet, Rfl: 0   oxyCODONE  (OXY IR/ROXICODONE ) 5 MG immediate release tablet, Take 5 mg by mouth every 4 (four) hours as needed., Disp: , Rfl:     rosuvastatin  (CRESTOR ) 10 MG tablet, SMARTSIG:1 Tablet(s) By Mouth Every Evening, Disp: , Rfl:    tirzepatide (MOUNJARO) 5 MG/0.5ML Pen, Inject 5 mg into the skin once a week., Disp: , Rfl:    triamcinolone  cream (KENALOG ) 0.1 %, daily as needed., Disp: , Rfl: 2   valACYclovir  (VALTREX ) 500 MG tablet, Take one tablet (500 mg) by mouth twice daily for 3 days for an outbreak., Disp: 30 tablet, Rfl: 2  Vitals   Vitals:   02/16/24 1924 02/16/24 1929  BP: 136/75   Pulse: (!) 58   Resp: 18   Temp: 98 F (36.7 C)   SpO2: 98%   Weight:  79.5 kg    Body mass index is 30.08 kg/m.  Physical Exam   Physical Exam Gen: A&O x4, NAD Resp: normal WOB CV: extremities appear well-perfused  Neurologic Examination   Neuro: *MS: A&O x2 but not to time. Able to tell my her parents' names and all of her children's birthdates but cannot remember going to [facility] this morning or what she had for breakfast. Follows multi-step commands.  *Speech: nondysarthric, no aphasia, able to name and repeat *CN: PERRL 3mm, EOMI, VFF by confrontation, sensation intact, smile symmetric, hearing intact to voice *Motor:   Normal bulk.  No tremor, rigidity or bradykinesia. No pronator drift. All extremities appear full-strength and symmetric. *Sensory: SILT. Symmetric. No double-simultaneous extinction.  *Coordination:  Finger-to-nose, heel-to-shin, rapid alternating motions were intact. *Reflexes:  UTA 2/2 tele-exam *Gait: deferred  Labs/Imaging/Neurodiagnostic studies   CBC: No results for input(s): "WBC", "NEUTROABS", "HGB", "HCT", "MCV", "PLT" in the last 168 hours. Basic Metabolic Panel:  Lab Results  Component Value Date   NA 143 01/01/2022   K 4.8 01/01/2022   CO2 21 01/01/2022   GLUCOSE 95 01/01/2022   BUN 13 01/01/2022   CREATININE 0.83 01/01/2022   CALCIUM  10.1 01/01/2022   GFRNONAA >60 11/07/2021   GFRAA 115 10/13/2020   Lipid Panel:  Lab Results  Component Value Date   LDLCALC 73 01/01/2022    HgbA1c:  Lab Results  Component Value Date   HGBA1C 5.5 01/01/2022   Urine Drug Screen: No results found for: "LABOPIA", "COCAINSCRNUR", "LABBENZ", "AMPHETMU", "THCU", "LABBARB"  Alcohol Level No results found for: "ETH" INR No results found for: "INR" APTT No results found for: "APTT" AED levels: No results found for: "PHENYTOIN", "ZONISAMIDE", "LAMOTRIGINE", "LEVETIRACETA"  CT Head without contrast(Personally reviewed): No acute process on personal review  ASSESSMENT   This is a 63 year old woman with history of prediabetes that presented to [facility] emergency department for acute impairment of short-term memory beginning at 1600 today.  Presentation is classic for transient global amnesia.  RECOMMENDATIONS   - Admit to [facility] for TGA workup (considered a TIA/stroke equivalent) - Permissive HTN x48 hrs from sx onset or until stroke ruled out by MRI goal BP <220/120. PRN labetalol or hydralazine if BP above these parameters. Avoid oral antihypertensives. - MRI brain wo contrast - CTA or MRA H&N - TTE w/ bubble - rEEG - Check A1c and LDL + add statin per guidelines - ASA 325mg  f/b ASA 81mg  daily - q4 hr neuro checks - STAT head CT for any change in neuro exam - Tele - PT/OT/SLP - Stroke education - Amb referral to neurology upon discharge   Please notify neurohospitalist at [facility] when patient arrives. Stroke team will follow in consult tomorrow.  ______________________________________________________________________    Signed, Eleni Griffin, MD [facility]

## 2024-02-16 NOTE — ED Notes (Signed)
Pt arrived in CT 

## 2024-02-16 NOTE — ED Notes (Signed)
 Code stroke activated by this RN at 42.

## 2024-02-16 NOTE — ED Triage Notes (Signed)
 LKW 1700- pt's husband reports he was outside and came in around 1800- found pt looked startled and kept repeating questions; she talked to her daughter on the phone at 1700 and was normal; expressive aphasia noted when pt trying to indicate head feels tingly during triage EDP notified at 1925, cbg 93

## 2024-02-17 ENCOUNTER — Observation Stay (HOSPITAL_COMMUNITY)

## 2024-02-17 ENCOUNTER — Observation Stay (HOSPITAL_BASED_OUTPATIENT_CLINIC_OR_DEPARTMENT_OTHER)

## 2024-02-17 DIAGNOSIS — Z7985 Long-term (current) use of injectable non-insulin antidiabetic drugs: Secondary | ICD-10-CM

## 2024-02-17 DIAGNOSIS — R001 Bradycardia, unspecified: Secondary | ICD-10-CM | POA: Diagnosis not present

## 2024-02-17 DIAGNOSIS — R569 Unspecified convulsions: Secondary | ICD-10-CM

## 2024-02-17 DIAGNOSIS — E785 Hyperlipidemia, unspecified: Secondary | ICD-10-CM

## 2024-02-17 DIAGNOSIS — Z823 Family history of stroke: Secondary | ICD-10-CM

## 2024-02-17 DIAGNOSIS — F419 Anxiety disorder, unspecified: Secondary | ICD-10-CM

## 2024-02-17 DIAGNOSIS — F1090 Alcohol use, unspecified, uncomplicated: Secondary | ICD-10-CM

## 2024-02-17 DIAGNOSIS — R413 Other amnesia: Secondary | ICD-10-CM | POA: Diagnosis not present

## 2024-02-17 DIAGNOSIS — F32A Depression, unspecified: Secondary | ICD-10-CM | POA: Diagnosis not present

## 2024-02-17 DIAGNOSIS — E119 Type 2 diabetes mellitus without complications: Secondary | ICD-10-CM

## 2024-02-17 LAB — ECHOCARDIOGRAM COMPLETE
Area-P 1/2: 4.21 cm2
Height: 64 in
S' Lateral: 2.6 cm
Weight: 2895.96 [oz_av]

## 2024-02-17 LAB — CREATININE, SERUM
Creatinine, Ser: 0.74 mg/dL (ref 0.44–1.00)
GFR, Estimated: >60 mL/min (ref >60)

## 2024-02-17 LAB — HEMOGLOBIN A1C
Hgb A1c MFr Bld: 5.4 % (ref 4.8–5.6)
Mean Plasma Glucose: 108 mg/dL

## 2024-02-17 LAB — CBC
HCT: 40.5 % (ref 36.0–46.0)
Hemoglobin: 13.5 g/dL (ref 12.0–15.0)
MCH: 28.4 pg (ref 26.0–34.0)
MCHC: 33.3 g/dL (ref 30.0–36.0)
MCV: 85.3 fL (ref 80.0–100.0)
Platelets: 285 10*3/uL (ref 150–400)
RBC: 4.75 MIL/uL (ref 3.87–5.11)
RDW: 12.7 % (ref 11.5–15.5)
WBC: 5.4 10*3/uL (ref 4.0–10.5)
nRBC: 0 % (ref 0.0–0.2)

## 2024-02-17 LAB — HIV ANTIBODY (ROUTINE TESTING W REFLEX): HIV Screen 4th Generation wRfx: NONREACTIVE

## 2024-02-17 MED ORDER — ACETAMINOPHEN 160 MG/5ML PO SOLN: 650.0000 mg | ORAL | Status: DC | PRN

## 2024-02-17 MED ORDER — ALBUTEROL SULFATE (2.5 MG/3ML) 0.083% IN NEBU: 3.0000 mL | INHALATION_SOLUTION | RESPIRATORY_TRACT | Status: DC | PRN

## 2024-02-17 MED ORDER — SODIUM CHLORIDE 0.9 % IV SOLN: INTRAVENOUS | Status: DC

## 2024-02-17 MED ORDER — ACETAMINOPHEN 325 MG PO TABS: 650.0000 mg | ORAL_TABLET | ORAL | Status: DC | PRN

## 2024-02-17 MED ORDER — ROSUVASTATIN CALCIUM 5 MG PO TABS
10.0000 mg | ORAL_TABLET | Freq: Every day | ORAL | Status: DC
  Administered 2024-02-17: 10 mg via ORAL
  Filled 2024-02-17: qty 2

## 2024-02-17 MED ORDER — ESCITALOPRAM OXALATE 10 MG PO TABS
20.0000 mg | ORAL_TABLET | Freq: Every day | ORAL | Status: DC
  Administered 2024-02-17: 20 mg via ORAL
  Filled 2024-02-17: qty 2

## 2024-02-17 MED ORDER — SENNOSIDES-DOCUSATE SODIUM 8.6-50 MG PO TABS: 1.0000 | ORAL_TABLET | Freq: Every evening | ORAL | Status: DC | PRN

## 2024-02-17 MED ORDER — STROKE: EARLY STAGES OF RECOVERY BOOK: Freq: Once | Status: DC

## 2024-02-17 MED ORDER — ACETAMINOPHEN 650 MG RE SUPP: 650.0000 mg | RECTAL | Status: DC | PRN

## 2024-02-17 MED ORDER — ENOXAPARIN SODIUM 40 MG/0.4ML IJ SOSY
40.0000 mg | PREFILLED_SYRINGE | INTRAMUSCULAR | Status: DC
  Administered 2024-02-17: 40 mg via SUBCUTANEOUS
  Filled 2024-02-17: qty 0.4

## 2024-02-17 NOTE — ED Notes (Signed)
 Tele neuro exam underway. EDP to bedside.

## 2024-02-17 NOTE — ED Notes (Addendum)
Teleneuro MD on screen.

## 2024-02-17 NOTE — H&P (Signed)
 History and Physical    [name] GMW:[id number] DOB: 1961-02-04 DOA: 02/16/2024  PCP: Candise Chambers, MD  Patient coming from: Home  I have personally briefly reviewed patient's old medical records in [facility]  Chief Complaint: Memory deficit  HPI: Lisa Wade is a 63 y.o. female with medical history significant of anxiety disorder, hyperlipidemia, asthma, obstructive sleep apnea and prediabetes was brought into the hospital due to memory loss. last known well 1600 when husband reports that she was normal when he went out to go and do some yard work. When she came back she had a funny look on her face and asked him if it was Mother's Day. They have been celebrating Mother's Day all weekend so he was surprised to hear her asked this. Since that time she is asked and about 30 times what the data is and if it is Mother's Day. She seems surprised each time. She has no focal deficits. NIH stroke scale is 0.  On my evaluation here at [facility] today, patient is fully alert and oriented.  Husband and son at the bedside have confirmed that patient now appears to be back at her baseline.  However patient is still does not recall the events from yesterday.  She does however recall having lunch with her kids and family yesterday but she says " it feels like it was a long time ago although it was just yesterday".  No other complaint.  No focal deficit ever noted alongside these events.  ED Course: Upon arrival to [facility] ED, patient was fairly hemodynamically stable, other than some brief episodes of bradycardia and tachypnea, she was stable.  Labs unremarkable.  Review of Systems: As per HPI otherwise negative.    Past Medical History:  Diagnosis Date   Acid reflux    Anxiety    Back pain    Bladder prolapse, female, acquired    C. difficile colitis    from cephalosporins   Chondromalacia    COVID 08/12/2021   mild symptoms   Dysplasia of cervix, low grade (CIN 1) 1991    Fibroids    uterine   Heartburn    High cholesterol    History of COVID-19 06/05/2021   History of prediabetes    last A1C on 10/04/21 was 5.6   HSV-1 infection 2010   cold sores   Lumbar spondylosis    Mild depression    Palpitations    Plantar fasciitis, bilateral    Simple endometrial hyperplasia without atypia 08/2021   Sleep apnea 04/02/2017   home sleep study, Pulmonologist LOV 08/23/2017 with Dr. Mardeen Shackleton. Patient uses cpap nightly per pt on 10/26/21.   SOBOE (shortness of breath on exertion) 10/13/2020   per MD note from [facility],  SOBOE likely related to obesity & exericise induced.   Social anxiety disorder 1998   Swallowing difficulty    feeling of fullness in upper chest, resolved as of 10/26/2021 per pt   Swelling of both lower extremities    hx of left ankle swelling and knee arthritis   Vitamin D  deficiency    Wears contact lenses     Past Surgical History:  Procedure Laterality Date   ANTERIOR (CYSTOCELE) AND POSTERIOR REPAIR (RECTOCELE) WITH XENFORM GRAFT AND SACROSPINOUS FIXATION N/A 11/06/2021   Procedure: ANTERIOR AND POSTERIOR COLPORRHAPHY;  Surgeon: Greta Leatherwood, MD;  Location: [facility];  Service: Gynecology;  Laterality: N/A;   APPENDECTOMY  1972  BLADDER SUSPENSION N/A 11/06/2021   Procedure: TRANSVAGINAL TAPE EXACT MIDURETHRAL;  Surgeon: Greta Leatherwood, MD;  Location: [facility];  Service: Gynecology;  Laterality: N/A;   BREAST ENHANCEMENT SURGERY  10/08/2006   Saline Hobart Lulas)   COLONOSCOPY     approximately 2014, had previous colonoscopy 5 years ealier   CYSTOSCOPY N/A 11/06/2021   Procedure: CYSTOSCOPY;  Surgeon: Greta Leatherwood, MD;  Location: [facility];  Service: Gynecology;  Laterality: N/A;   DG BARIUM SWALLOW ([facility] HX)     around 2022   TOTAL LAPAROSCOPIC HYSTERECTOMY WITH SALPINGECTOMY Bilateral 11/06/2021   Procedure: TOTAL LAPAROSCOPIC  HYSTERECTOMY WITH BILATERAL SALPINGOOOPHORECTOMY.;  Surgeon: Greta Leatherwood, MD;  Location: [facility];  Service: Gynecology;  Laterality: Bilateral;   TUBAL LIGATION  01/1995     reports that she quit smoking about 13 years ago. Her smoking use included cigarettes. She started smoking about 23 years ago. She has never used smokeless tobacco. She reports current alcohol use. She reports that she does not use drugs.  Allergies  Allergen Reactions   Iodinated Contrast Media Hives    1 single hive noted after CT contrast administration, MD M. Shogry recommended premedication with oral Benadryl  for future CT scans w/contrast.    Cephalosporins Diarrhea    Caused C. Diff per pt    Family History  Problem Relation Age of Onset   Cancer Father        lung   Thyroid disease Father    Cancer Brother        testicular cancer   Osteoarthritis Mother    Migraines Mother    Thyroid disease Mother    Depression Mother    Anxiety disorder Mother    Obesity Mother    Osteoarthritis Maternal Grandmother    Hypertension Paternal Grandmother    Stroke Paternal Grandmother     Prior to Admission medications   Medication Sig Start Date End Date Taking? Authorizing Provider  albuterol (VENTOLIN HFA) 108 (90 Base) MCG/ACT inhaler SMARTSIG:1-2 Puff(s) Via Inhaler Every 4 Hours PRN 03/14/22   [provider]  celecoxib (CELEBREX) 200 MG capsule Take 200 mg by mouth 2 (two) times daily as needed. 02/10/24   [provider]  ciprofloxacin  (CIPRO ) 250 MG tablet Take 250 mg by mouth 2 (two) times daily. 08/08/23   [provider]  escitalopram  (LEXAPRO ) 20 MG tablet Take 20 mg by mouth daily.    [provider]  methocarbamol  (ROBAXIN ) 500 MG tablet Take 1 tablet (500 mg total) by mouth every 8 (eight) hours as needed for muscle spasms. 08/14/23   Bridget Campion, MD  ondansetron  (ZOFRAN -ODT) 4 MG disintegrating tablet Take 4 mg by mouth every 8  (eight) hours as needed for nausea or vomiting. 09/09/23   [provider]  oxyCODONE  (OXY IR/ROXICODONE ) 5 MG immediate release tablet Take 5 mg by mouth every 4 (four) hours as needed. 03/16/22   [provider]  rosuvastatin  (CRESTOR ) 10 MG tablet SMARTSIG:1 Tablet(s) By Mouth Every Evening 03/18/20   [provider]  tirzepatide (MOUNJARO) 5 MG/0.5ML Pen Inject 5 mg into the skin once a week.    [provider]  triamcinolone  cream (KENALOG ) 0.1 % daily as needed. 03/24/18   [provider]  valACYclovir  (VALTREX ) 500 MG tablet Take one tablet (500 mg) by mouth twice daily for 3 days for an outbreak. 06/21/22   Greta Leatherwood, MD  Physical Exam: Vitals:   02/17/24 0100 02/17/24 0130 02/17/24 0200 02/17/24 0251  BP: 123/74 (!) 110/57 120/66 114/61  Pulse: 64 (!) 59 (!) 58 (!) 59  Resp: (!) 23 (!) 22 18 18   Temp:    97.6 F (36.4 C)  TempSrc:    Oral  SpO2: 97% 95% 98% 95%  Weight:      Height:        Constitutional: NAD, calm, comfortable Vitals:   02/17/24 0100 02/17/24 0130 02/17/24 0200 02/17/24 0251  BP: 123/74 (!) 110/57 120/66 114/61  Pulse: 64 (!) 59 (!) 58 (!) 59  Resp: (!) 23 (!) 22 18 18   Temp:    97.6 F (36.4 C)  TempSrc:    Oral  SpO2: 97% 95% 98% 95%  Weight:      Height:       Eyes: PERRL, lids and conjunctivae normal ENMT: Mucous membranes are moist. Posterior pharynx clear of any exudate or lesions.Normal dentition.  Neck: normal, supple, no masses, no thyromegaly Respiratory: clear to auscultation bilaterally, no wheezing, no crackles. Normal respiratory effort. No accessory muscle use.  Cardiovascular: Regular rate and rhythm, no murmurs / rubs / gallops. No extremity edema. 2+ pedal pulses. No carotid bruits.  Abdomen: no tenderness, no masses palpated. No hepatosplenomegaly. Bowel sounds positive.  Musculoskeletal: no clubbing / cyanosis. No joint deformity upper and lower extremities. Good ROM, no  contractures. Normal muscle tone.  Skin: no rashes, lesions, ulcers. No induration Neurologic: CN 2-12 grossly intact. Sensation intact, DTR normal. Strength 5/5 in all 4.  Psychiatric: Normal judgment and insight. Alert and oriented x 3. Normal mood.    Labs on Admission: I have personally reviewed following labs and imaging studies  CBC: Recent Labs  Lab 02/16/24 1936  WBC 6.4  NEUTROABS 3.2  HGB 14.2  HCT 41.5  MCV 85.2  PLT 299   Basic Metabolic Panel: Recent Labs  Lab 02/16/24 1936  NA 142  K 4.1  CL 106  CO2 23  GLUCOSE 102*  BUN 11  CREATININE 0.78  CALCIUM  9.8   GFR: Estimated Creatinine Clearance: 77.6 mL/min (by C-G formula based on SCr of 0.78 mg/dL). Liver Function Tests: Recent Labs  Lab 02/16/24 1936  AST 27  ALT 26  ALKPHOS 72  BILITOT 0.3  PROT 7.2  ALBUMIN  4.6   No results for input(s): "LIPASE", "AMYLASE" in the last 168 hours. No results for input(s): "AMMONIA" in the last 168 hours. Coagulation Profile: Recent Labs  Lab 02/16/24 1936  INR 0.9   Cardiac Enzymes: No results for input(s): "CKTOTAL", "CKMB", "CKMBINDEX", "TROPONINI" in the last 168 hours. BNP (last 3 results) No results for input(s): "PROBNP" in the last 8760 hours. HbA1C: No results for input(s): "HGBA1C" in the last 72 hours. CBG: Recent Labs  Lab 02/16/24 1923  GLUCAP 119*   Lipid Profile: No results for input(s): "CHOL", "HDL", "LDLCALC", "TRIG", "CHOLHDL", "LDLDIRECT" in the last 72 hours. Thyroid Function Tests: No results for input(s): "TSH", "T4TOTAL", "FREET4", "T3FREE", "THYROIDAB" in the last 72 hours. Anemia Panel: No results for input(s): "VITAMINB12", "FOLATE", "FERRITIN", "TIBC", "IRON", "RETICCTPCT" in the last 72 hours. Urine analysis:    Component Value Date/Time   COLORURINE YELLOW 10/11/2021 0824   APPEARANCEUR CLEAR 10/11/2021 0824   LABSPEC 1.020 10/11/2021 0824   PHURINE 5.5 10/11/2021 0824   GLUCOSEU NEGATIVE 10/11/2021 0824   HGBUR  1+ (A) 10/11/2021 0824   BILIRUBINUR n 03/18/2017 1449   KETONESUR NEGATIVE 10/11/2021 0824   PROTEINUR  TRACE (A) 10/11/2021 0824   UROBILINOGEN 0.2 03/18/2017 1449   NITRITE n 03/18/2017 1449   LEUKOCYTESUR Small (1+) (A) 03/18/2017 1449    Radiological Exams on Admission: CT HEAD CODE STROKE WO CONTRAST Result Date: 02/16/2024 CLINICAL DATA:  Code stroke.  Altered mental status. EXAM: CT HEAD WITHOUT CONTRAST TECHNIQUE: Contiguous axial images were obtained from the base of the skull through the vertex without intravenous contrast. RADIATION DOSE REDUCTION: This exam was performed according to the departmental dose-optimization program which includes automated exposure control, adjustment of the mA and/or kV according to patient size and/or use of iterative reconstruction technique. COMPARISON:  None Available. FINDINGS: Brain: No acute intracranial hemorrhage. No CT evidence of acute infarct. Nonspecific hypoattenuation in the periventricular and subcortical white matter favored to reflect chronic microvascular ischemic changes. No edema, mass effect, or midline shift. The basilar cisterns are patent. Ventricles: The ventricles are normal. Vascular: No hyperdense vessel or unexpected calcification. Skull: No acute or aggressive finding. Orbits: Orbits are symmetric. Sinuses: Mucosal thickening in the alveolar recess of the left maxillary sinus. Other: Mastoid air cells are clear. ASPECTS [facility] Stroke Program Early CT Score) - Ganglionic level infarction (caudate, lentiform nuclei, internal capsule, insula, M1-M3 cortex): 7 - Supraganglionic infarction (M4-M6 cortex): 3 Total score (0-10 with 10 being normal): 10 IMPRESSION: 1. No CT evidence of acute intracranial abnormality. 2. ASPECTS is 10 Results sent via epic message at the time of interpretation on 02/16/2024 at 7:40 pm to provider [facility] . Electronically Signed   By: Denny Flack M.D.   On: 02/16/2024 20:02     Assessment/Plan Principal Problem:   Amnesia   Short-term memory loss/possible TGA: Patient's memory is coming back.  CT head negative.  MRI in process.  Neurology following.  NIH 0.  No focal deficit.  Patient not on any antiplatelet or prior to admission or here.  Defer to neurology.  Echo pending.  Prediabetes: Patient on Mounjaro PTA.  Hemoglobin A1c pending.  Hyperlipidemia: Resume Crestor .  Check lipid panel.  Social anxiety disorder: Resume escitalopram .  DVT prophylaxis: enoxaparin  (LOVENOX ) injection 40 mg Start: 02/17/24 0830 Code Status: Full code Family Communication: Husband and son present at bedside.  Plan of care discussed with patient in length and he verbalized understanding and agreed with it. Disposition Plan: Potential discharge later today Consults called: Neurology  Modena Andes MD [facility]  *Please note that this is a verbal dictation therefore any spelling or grammatical errors are due to the "Dragon Medical One" system interpretation.  Please page via Amion and do not message via secure chat for urgent patient care matters. Secure chat can be used for non urgent patient care matters. 02/17/2024, 7:33 AM  To contact the attending provider between 7A-7P or the covering provider during after hours 7P-7A, please log into the web site [email or web address]

## 2024-02-17 NOTE — ED Notes (Signed)
Pt to CT with 2 RNs. 

## 2024-02-17 NOTE — Plan of Care (Signed)
 Does not appear to be confused at this time but said she realizes she is not quite back to her norm.  She is sitting up in the bed talking with family.  No signs of distress.  Problem: Health Behavior/Discharge Planning: Goal: Ability to manage health-related needs will improve Outcome: Progressing   Problem: Clinical Measurements: Goal: Respiratory complications will improve Outcome: Progressing   Problem: Activity: Goal: Risk for activity intolerance will decrease Outcome: Progressing   Problem: Nutrition: Goal: Adequate nutrition will be maintained Outcome: Progressing   Problem: Coping: Goal: Level of anxiety will decrease Outcome: Progressing

## 2024-02-17 NOTE — Progress Notes (Signed)
 Eeg complete, results are pending

## 2024-02-17 NOTE — Progress Notes (Signed)
 PT Cancellation Note  Patient Details Name: Lisa Wade MRN: [id number] DOB: [birth year]   Cancelled Treatment:    Reason Eval/Treat Not Completed: Patient at procedure or test/unavailable (pt getting set up for EEG)  Verdia Glad, PT, DPT Acute Rehabilitation Services Office [phone]    Claria Crofts 02/17/2024, 9:23 AM

## 2024-02-17 NOTE — Procedures (Signed)
 Patient Name: MAHRUKH TESSENDORF  MRN: [id number]  Epilepsy Attending: Arleene Lack  Referring Physician/Provider: Arnulfo Larch, MD  Date: 02/17/2024 Duration: 24.15 mins  Patient history: 63yo F with transient ams. EEG to evaluate for seizure.  Level of alertness: Awake  AEDs during EEG study: None  Technical aspects: This EEG study was done with scalp electrodes positioned according to the 10-20 International system of electrode placement. Electrical activity was reviewed with band pass filter of 1-70Hz , sensitivity of 7 uV/mm, display speed of 38mm/sec with a 60Hz  notched filter applied as appropriate. EEG data were recorded continuously and digitally stored.  Video monitoring was available and reviewed as appropriate.  Description: The posterior dominant rhythm consists of 9-10 Hz activity of moderate voltage (25-35 uV) seen predominantly in posterior head regions, symmetric and reactive to eye opening and eye closing. There is 15 to 18 Hz beta activity distributed symmetrically and diffusely. Hyperventilation and photic stimulation were not performed.     IMPRESSION: This study is within normal limits. No seizures or epileptiform discharges were seen throughout the recording.  A normal interictal EEG does not exclude the diagnosis of epilepsy.  Ralphine Hinks O Francois Elk

## 2024-02-17 NOTE — ED Notes (Signed)
Code stroke activated ?

## 2024-02-17 NOTE — Discharge Summary (Signed)
 Physician Discharge Summary  Lisa Wade ZOX:[id number] DOB: 1961-06-13 DOA: 02/16/2024  PCP: Lisa Chambers, MD  Admit date: 02/16/2024 Discharge date: 02/17/2024    Admitted From: Home Disposition:  Home  Recommendations for Outpatient Follow-up:  Follow up with PCP in 1-2 weeks Please obtain BMP/CBC in one week Please follow up with your PCP on the following pending results: Unresulted Labs (From admission, onward)     Start     Ordered   02/24/24 0500  Creatinine, serum  (enoxaparin  (LOVENOX )    CrCl >/= 30 ml/min)  Weekly,   R     Comments: while on enoxaparin  therapy    02/17/24 0733   02/18/24 0500  Lipid panel  (Labs)  Tomorrow morning,   R       Comments: Fasting    02/17/24 0733   02/17/24 0733  Hemoglobin A1c  (Labs)  Once,   R       Comments: To assess prior glycemic control    02/17/24 0733              Home Health: None Equipment/Devices: None  Discharge Condition: Stable CODE STATUS: Full code Diet recommendation: Cardiac  HPI: Lisa Wade is a 63 y.o. female with medical history significant of anxiety disorder, hyperlipidemia, asthma, obstructive sleep apnea and prediabetes was brought into the hospital due to memory loss. last known well 1600 when husband reports that she was normal when he went out to go and do some yard work. When she came back she had a funny look on her face and asked him if it was Mother's Day. They have been celebrating Mother's Day all weekend so he was surprised to hear her asked this. Since that time she is asked and about 30 times what the data is and if it is Mother's Day. She seems surprised each time. She has no focal deficits. NIH stroke scale is 0.  On my evaluation here at [facility] today, patient is fully alert and oriented.  Husband and son at the bedside have confirmed that patient now appears to be back at her baseline.  However patient is still does not recall the events from yesterday.  She does however recall  having lunch with her kids and family yesterday but she says " it feels like it was a long time ago although it was just yesterday".  No other complaint.  No focal deficit ever noted alongside these events.   ED Course: Upon arrival to [facility] ED, patient was fairly hemodynamically stable, other than some brief episodes of bradycardia and tachypnea, she was stable.  Labs unremarkable.  Subjective: Seen and examined.  Patient was admitted earlier, patient was already asymptomatic.  Brief/Interim Summary: Patient was admitted at [facility] by myself just few hours ago.  She already completed MRI which is unremarkable for any stroke.  Transthoracic echo was done which is negative/unremarkable as well.  EEG unremarkable.  Patient has no more memory deficit.  Family confirmed at the bedside.  Neurology suspects transient global amnesia.  No TIA or stroke.  No antiplatelet recommended.  They have cleared her for discharge.  Discharge plan was discussed with patient and/or family member and they verbalized understanding and agreed with it.  Discharge Diagnoses:  Principal Problem:   Global amnesia Active Problems:   Social anxiety disorder   Other hyperlipidemia   OSA on CPAP   Prediabetes    Discharge Instructions   Allergies as of 02/17/2024  Reactions   Iodinated Contrast Media Hives   1 single hive noted after CT contrast administration, MD M. Shogry recommended premedication with oral Benadryl  for future CT scans w/contrast.    Cephalosporins Diarrhea   Caused C. Diff per pt        Medication List     TAKE these medications    acetaminophen  500 MG tablet Commonly known as: TYLENOL  Take 500 mg by mouth every 6 (six) hours as needed for moderate pain (pain score 4-6).   albuterol 108 (90 Base) MCG/ACT inhaler Commonly known as: VENTOLIN HFA Inhale 1-2 puffs into the lungs every 4 (four) hours as needed for wheezing or shortness of breath.   CALCIUM  PO Take 2  Doses by mouth daily.   celecoxib 200 MG capsule Commonly known as: CELEBREX Take 200 mg by mouth 2 (two) times daily as needed for moderate pain (pain score 4-6).   D3 PO Take 1 tablet by mouth daily. + magnesium   escitalopram  20 MG tablet Commonly known as: LEXAPRO  Take 20 mg by mouth daily.   ibuprofen  200 MG tablet Commonly known as: ADVIL  Take 200-800 mg by mouth every 6 (six) hours as needed for moderate pain (pain score 4-6).   K2 PO Take 1 tablet by mouth daily.   methocarbamol  500 MG tablet Commonly known as: ROBAXIN  Take 1 tablet (500 mg total) by mouth every 8 (eight) hours as needed for muscle spasms.   Mounjaro 5 MG/0.5ML Pen Generic drug: tirzepatide Inject 5 mg into the skin once a week.   ondansetron  4 MG disintegrating tablet Commonly known as: ZOFRAN -ODT Take 4 mg by mouth every 8 (eight) hours as needed for nausea or vomiting.   rosuvastatin  10 MG tablet Commonly known as: CRESTOR  Take 10 mg by mouth daily.   valACYclovir  500 MG tablet Commonly known as: VALTREX  Take one tablet (500 mg) by mouth twice daily for 3 days for an outbreak.        Follow-up Information     Lisa Chambers, MD Follow up in 1 week(s).   Specialty: Family Medicine Contact information: [address] [place] [address] [phone]                Allergies  Allergen Reactions   Iodinated Contrast Media Hives    1 single hive noted after CT contrast administration, MD M. Shogry recommended premedication with oral Benadryl  for future CT scans w/contrast.    Cephalosporins Diarrhea    Caused C. Diff per pt    Consultations: Neurology   Procedures/Studies: EEG adult Result Date: 02/17/2024 Lisa Lack, MD     02/17/2024 10:45 AM Patient Name: Lisa Wade MRN: [id number] Epilepsy Attending: Arleene Wade Referring Physician/Provider: Arnulfo Larch, MD Date: 02/17/2024 Duration: 24.15 mins Patient history: 63yo F with transient ams.  EEG to evaluate for seizure. Level of alertness: Awake AEDs during EEG study: None Technical aspects: This EEG study was done with scalp electrodes positioned according to the 10-20 International system of electrode placement. Electrical activity was reviewed with band pass filter of 1-70Hz , sensitivity of 7 uV/mm, display speed of 16mm/sec with a 60Hz  notched filter applied as appropriate. EEG data were recorded continuously and digitally stored.  Video monitoring was available and reviewed as appropriate. Description: The posterior dominant rhythm consists of 9-10 Hz activity of moderate voltage (25-35 uV) seen predominantly in posterior head regions, symmetric and reactive to eye opening and eye closing. There is 15 to 18 Hz beta activity distributed  symmetrically and diffusely. Hyperventilation and photic stimulation were not performed.   IMPRESSION: This study is within normal limits. No seizures or epileptiform discharges were seen throughout the recording. A normal interictal EEG does not exclude the diagnosis of epilepsy. Lisa Wade   MR BRAIN WO CONTRAST Result Date: 02/17/2024 CLINICAL DATA:  Transient global amnesia EXAM: MRI HEAD WITHOUT CONTRAST TECHNIQUE: Multiplanar, multiecho pulse sequences of the brain and surrounding structures were obtained without intravenous contrast. COMPARISON:  Head CT from yesterday FINDINGS: Brain: No acute infarction, hemorrhage, hydrocephalus, extra-axial collection or mass lesion. Few remote white matter insults with nonspecific pattern, commonly seen to this limited degree in healthy patients of this age. No restricted diffusion or other signal abnormality at the medial temporal lobes. Vascular: Normal flow voids. Skull and upper cervical spine: Normal marrow signal. Sinuses/Orbits: Negative. IMPRESSION: Unremarkable exam for age. No correlate for transient global amnesia. Electronically Signed   By: Ronnette Coke M.D.   On: 02/17/2024 09:00   CT HEAD CODE  STROKE WO CONTRAST Result Date: 02/16/2024 CLINICAL DATA:  Code stroke.  Altered mental status. EXAM: CT HEAD WITHOUT CONTRAST TECHNIQUE: Contiguous axial images were obtained from the base of the skull through the vertex without intravenous contrast. RADIATION DOSE REDUCTION: This exam was performed according to the departmental dose-optimization program which includes automated exposure control, adjustment of the mA and/or kV according to patient size and/or use of iterative reconstruction technique. COMPARISON:  None Available. FINDINGS: Brain: No acute intracranial hemorrhage. No CT evidence of acute infarct. Nonspecific hypoattenuation in the periventricular and subcortical white matter favored to reflect chronic microvascular ischemic changes. No edema, mass effect, or midline shift. The basilar cisterns are patent. Ventricles: The ventricles are normal. Vascular: No hyperdense vessel or unexpected calcification. Skull: No acute or aggressive finding. Orbits: Orbits are symmetric. Sinuses: Mucosal thickening in the alveolar recess of the left maxillary sinus. Other: Mastoid air cells are clear. ASPECTS [facility] Stroke Program Early CT Score) - Ganglionic level infarction (caudate, lentiform nuclei, internal capsule, insula, M1-M3 cortex): 7 - Supraganglionic infarction (M4-M6 cortex): 3 Total score (0-10 with 10 being normal): 10 IMPRESSION: 1. No CT evidence of acute intracranial abnormality. 2. ASPECTS is 10 Results sent via epic message at the time of interpretation on 02/16/2024 at 7:40 pm to provider [facility] . Electronically Signed   By: Denny Flack M.D.   On: 02/16/2024 20:02     Discharge Exam: Vitals:   02/17/24 0833 02/17/24 1214  BP: (!) 113/52 (!) 113/58  Pulse: 60 64  Resp: 18 18  Temp: 97.7 F (36.5 C) (!) 97.5 F (36.4 C)  SpO2: 98% 98%   Vitals:   02/17/24 0200 02/17/24 0251 02/17/24 0833 02/17/24 1214  BP: 120/66 114/61 (!) 113/52 (!) 113/58  Pulse: (!) 58 (!) 59 60  64  Resp: 18 18 18 18   Temp:  97.6 F (36.4 C) 97.7 F (36.5 C) (!) 97.5 F (36.4 C)  TempSrc:  Oral Oral Oral  SpO2: 98% 95% 98% 98%  Weight:      Height:        General: Pt is alert, awake, not in acute distress Cardiovascular: RRR, S1/S2 +, no rubs, no gallops Respiratory: CTA bilaterally, no wheezing, no rhonchi Abdominal: Soft, NT, ND, bowel sounds + Extremities: no edema, no cyanosis    The results of significant diagnostics from this hospitalization (including imaging, microbiology, ancillary and laboratory) are listed below for reference.     Microbiology: No results found for this  or any previous visit (from the past 240 hours).   Labs: BNP (last 3 results) No results for input(s): "BNP" in the last 8760 hours. Basic Metabolic Panel: Recent Labs  Lab 02/16/24 1936 02/17/24 0852  NA 142  --   K 4.1  --   CL 106  --   CO2 23  --   GLUCOSE 102*  --   BUN 11  --   CREATININE 0.78 0.74  CALCIUM  9.8  --    Liver Function Tests: Recent Labs  Lab 02/16/24 1936  AST 27  ALT 26  ALKPHOS 72  BILITOT 0.3  PROT 7.2  ALBUMIN  4.6   No results for input(s): "LIPASE", "AMYLASE" in the last 168 hours. No results for input(s): "AMMONIA" in the last 168 hours. CBC: Recent Labs  Lab 02/16/24 1936 02/17/24 0852  WBC 6.4 5.4  NEUTROABS 3.2  --   HGB 14.2 13.5  HCT 41.5 40.5  MCV 85.2 85.3  PLT 299 285   Cardiac Enzymes: No results for input(s): "CKTOTAL", "CKMB", "CKMBINDEX", "TROPONINI" in the last 168 hours. BNP: Invalid input(s): "POCBNP" CBG: Recent Labs  Lab 02/16/24 1923  GLUCAP 119*   D-Dimer No results for input(s): "DDIMER" in the last 72 hours. Hgb A1c No results for input(s): "HGBA1C" in the last 72 hours. Lipid Profile No results for input(s): "CHOL", "HDL", "LDLCALC", "TRIG", "CHOLHDL", "LDLDIRECT" in the last 72 hours. Thyroid function studies No results for input(s): "TSH", "T4TOTAL", "T3FREE", "THYROIDAB" in the last 72  hours.  Invalid input(s): "FREET3" Anemia work up No results for input(s): "VITAMINB12", "FOLATE", "FERRITIN", "TIBC", "IRON", "RETICCTPCT" in the last 72 hours. Urinalysis    Component Value Date/Time   COLORURINE YELLOW 10/11/2021 0824   APPEARANCEUR CLEAR 10/11/2021 0824   LABSPEC 1.020 10/11/2021 0824   PHURINE 5.5 10/11/2021 0824   GLUCOSEU NEGATIVE 10/11/2021 0824   HGBUR 1+ (A) 10/11/2021 0824   BILIRUBINUR n 03/18/2017 1449   KETONESUR NEGATIVE 10/11/2021 0824   PROTEINUR TRACE (A) 10/11/2021 0824   UROBILINOGEN 0.2 03/18/2017 1449   NITRITE n 03/18/2017 1449   LEUKOCYTESUR Small (1+) (A) 03/18/2017 1449   Sepsis Labs Recent Labs  Lab 02/16/24 1936 02/17/24 0852  WBC 6.4 5.4   Microbiology No results found for this or any previous visit (from the past 240 hours).  FURTHER DISCHARGE INSTRUCTIONS:   Get Medicines reviewed and adjusted: Please take all your medications with you for your next visit with your Primary MD   Laboratory/radiological data: Please request your Primary MD to go over all hospital tests and procedure/radiological results at the follow up, please ask your Primary MD to get all Hospital records sent to his/her office.   In some cases, they will be blood work, cultures and biopsy results pending at the time of your discharge. Please request that your primary care M.D. goes through all the records of your hospital data and follows up on these results.   Also Note the following: If you experience worsening of your admission symptoms, develop shortness of breath, life threatening emergency, suicidal or homicidal thoughts you must seek medical attention immediately by calling 911 or calling your MD immediately  if symptoms less severe.   You must read complete instructions/literature along with all the possible adverse reactions/side effects for all the Medicines you take and that have been prescribed to you. Take any new Medicines after you have  completely understood and accpet all the possible adverse reactions/side effects.    Do not drive when  taking Pain medications or sleeping medications (Benzodaizepines)   Do not take more than prescribed Pain, Sleep and Anxiety Medications. It is not advisable to combine anxiety,sleep and pain medications without talking with your primary care practitioner   Special Instructions: If you have smoked or chewed Tobacco  in the last 2 yrs please stop smoking, stop any regular Alcohol  and or any Recreational drug use.   Wear Seat belts while driving.   Please note: You were cared for by a hospitalist during your hospital stay. Once you are discharged, your primary care physician will handle any further medical issues. Please note that NO REFILLS for any discharge medications will be authorized once you are discharged, as it is imperative that you return to your primary care physician (or establish a relationship with a primary care physician if you do not have one) for your post hospital discharge needs so that they can reassess your need for medications and monitor your lab values  Time coordinating discharge: Over 30 minutes  SIGNED:   Modena Andes, MD  [facility] 02/17/2024, 1:36 PM *Please note that this is a verbal dictation therefore any spelling or grammatical errors are due to the "Dragon [employer] One" system interpretation. If 7PM-7AM, please contact night-coverage [email or web address]

## 2024-02-17 NOTE — TOC Transition Note (Signed)
 Transition of Care [facility]) - Discharge Note   Patient Details  Name: Lisa Wade MRN: [id number] Date of Birth: May 03, 1961  [facility] CM/SW Contact:  Jonathan Neighbor, RN Phone Number: 02/17/2024, 1:40 PM   Clinical Narrative:     Pt is discharging home with her spouse. Pt has been referred for outpatient PT per Dr Daisey Dryer. Pt will continue with this therapy after dc.  Spouse is able to provide transportation home. Pt manages her own medications and denies any issues.  DME at home: CPAP/ shower seat  Final next level of care: OP Rehab Barriers to Discharge: No Barriers Identified   Patient Goals and CMS Choice            Discharge Placement                       Discharge Plan and Services Additional resources added to the After Visit Summary for                                       Social Drivers of Health (SDOH) Interventions SDOH Screenings   Food Insecurity: No Food Insecurity (02/17/2024)  Housing: Low Risk  (02/17/2024)  Transportation Needs: No Transportation Needs (02/17/2024)  Utilities: Not At Risk (02/17/2024)  Depression (PHQ2-9): Medium Risk (10/13/2020)  Social Connections: Unknown (02/20/2022)   Received from [facility]  Tobacco Use: Medium Risk (02/16/2024)     Readmission Risk Interventions     No data to display

## 2024-02-17 NOTE — Plan of Care (Signed)

## 2024-02-17 NOTE — Progress Notes (Addendum)
 STROKE TEAM PROGRESS NOTE   INTERIM HISTORY/SUBJECTIVE Seen in room with family at the bedside.  Her husband tells us  that she recently lost a friend to lung cancer.  During the episode one of the things that she was focusing on was the recent passing of her friend, repeatedly asking if she had passed away.  She was also repeatedly asking if they were in the month of May and if it was Mother's Day.  They deny previous episodes similar to this.  Patient does endorse a history of significant anxiety and depression as a young adult.  Her first husband/father of her son is currently going to lung cancer and her husband's first wife had ALS.  Given these life stressors and her past medical history she is at risk for transient global amnesia.    OBJECTIVE  CBC    Component Value Date/Time   WBC 5.4 02/17/2024 0852   RBC 4.75 02/17/2024 0852   HGB 13.5 02/17/2024 0852   HGB 14.7 10/13/2020 1056   HCT 40.5 02/17/2024 0852   HCT 43.5 10/13/2020 1056   PLT 285 02/17/2024 0852   PLT 308 10/13/2020 1056   MCV 85.3 02/17/2024 0852   MCV 86 10/13/2020 1056   MCH 28.4 02/17/2024 0852   MCHC 33.3 02/17/2024 0852   RDW 12.7 02/17/2024 0852   RDW 12.2 10/13/2020 1056   LYMPHSABS 2.3 02/16/2024 1936   LYMPHSABS 1.8 10/13/2020 1056   MONOABS 0.7 02/16/2024 1936   EOSABS 0.1 02/16/2024 1936   EOSABS 0.1 10/13/2020 1056   BASOSABS 0.1 02/16/2024 1936   BASOSABS 0.0 10/13/2020 1056    BMET    Component Value Date/Time   NA 142 02/16/2024 1936   NA 143 01/01/2022 1218   K 4.1 02/16/2024 1936   CL 106 02/16/2024 1936   CO2 23 02/16/2024 1936   GLUCOSE 102 (H) 02/16/2024 1936   BUN 11 02/16/2024 1936   BUN 13 01/01/2022 1218   CREATININE 0.78 02/16/2024 1936   CALCIUM  9.8 02/16/2024 1936   EGFR 82 01/01/2022 1218   GFRNONAA >60 02/16/2024 1936    IMAGING past 24 hours MR BRAIN WO CONTRAST Result Date: 02/17/2024 CLINICAL DATA:  Transient global amnesia EXAM: MRI HEAD WITHOUT CONTRAST  TECHNIQUE: Multiplanar, multiecho pulse sequences of the brain and surrounding structures were obtained without intravenous contrast. COMPARISON:  Head CT from yesterday FINDINGS: Brain: No acute infarction, hemorrhage, hydrocephalus, extra-axial collection or mass lesion. Few remote white matter insults with nonspecific pattern, commonly seen to this limited degree in healthy patients of this age. No restricted diffusion or other signal abnormality at the medial temporal lobes. Vascular: Normal flow voids. Skull and upper cervical spine: Normal marrow signal. Sinuses/Orbits: Negative. IMPRESSION: Unremarkable exam for age. No correlate for transient global amnesia. Electronically Signed   By: Ronnette Coke M.D.   On: 02/17/2024 09:00   CT HEAD CODE STROKE WO CONTRAST Result Date: 02/16/2024 CLINICAL DATA:  Code stroke.  Altered mental status. EXAM: CT HEAD WITHOUT CONTRAST TECHNIQUE: Contiguous axial images were obtained from the base of the skull through the vertex without intravenous contrast. RADIATION DOSE REDUCTION: This exam was performed according to the departmental dose-optimization program which includes automated exposure control, adjustment of the mA and/or kV according to patient size and/or use of iterative reconstruction technique. COMPARISON:  None Available. FINDINGS: Brain: No acute intracranial hemorrhage. No CT evidence of acute infarct. Nonspecific hypoattenuation in the periventricular and subcortical white matter favored to reflect chronic microvascular ischemic changes. No  edema, mass effect, or midline shift. The basilar cisterns are patent. Ventricles: The ventricles are normal. Vascular: No hyperdense vessel or unexpected calcification. Skull: No acute or aggressive finding. Orbits: Orbits are symmetric. Sinuses: Mucosal thickening in the alveolar recess of the left maxillary sinus. Other: Mastoid air cells are clear. ASPECTS [facility] Stroke Program Early CT Score) - Ganglionic level  infarction (caudate, lentiform nuclei, internal capsule, insula, M1-M3 cortex): 7 - Supraganglionic infarction (M4-M6 cortex): 3 Total score (0-10 with 10 being normal): 10 IMPRESSION: 1. No CT evidence of acute intracranial abnormality. 2. ASPECTS is 10 Results sent via epic message at the time of interpretation on 02/16/2024 at 7:40 pm to provider [facility] . Electronically Signed   By: Denny Flack M.D.   On: 02/16/2024 20:02    Vitals:   02/17/24 0130 02/17/24 0200 02/17/24 0251 02/17/24 0833  BP: (!) 110/57 120/66 114/61 (!) 113/52  Pulse: (!) 59 (!) 58 (!) 59 60  Resp: (!) 22 18 18 18   Temp:   97.6 F (36.4 C) 97.7 F (36.5 C)  TempSrc:   Oral Oral  SpO2: 95% 98% 95% 98%  Weight:      Height:         PHYSICAL EXAM General:  Alert, well-nourished, well-developed patient in no acute distress Psych:  Mood and affect appropriate for situation CV: Regular rate and rhythm on monitor Respiratory:  Regular, unlabored respirations on room air GI: Abdomen soft and nontender   NEURO:  Mental Status: AA&Ox3, patient is able to give clear and coherent history Speech/Language: speech is without dysarthria or aphasia.  Naming, repetition, fluency, and comprehension intact.  Cranial Nerves:  II: PERRL. Visual fields full.  III, IV, VI: EOMI. Eyelids elevate symmetrically.  V: Sensation is intact to light touch and symmetrical to face.  VII: Face is symmetrical resting and smiling VIII: hearing intact to voice. IX, X: Palate elevates symmetrically. Phonation is normal.  ZO:[id number] shrug 5/5. XII: tongue is midline without fasciculations. Motor: 5/5 strength to all muscle groups tested.  Tone: is normal and bulk is normal Sensation- Intact to light touch bilaterally. Extinction absent to light touch to DSS.   Coordination: FTN intact bilaterally, HKS: no ataxia in BLE.No drift.  Gait- deferred  Most Recent NIH 0   Neurodiagnostics: Routine  EEG-normal  ASSESSMENT/PLAN  Lisa Wade is a 63 y.o. female with history of anxiety, depression, diabetes admitted for stroke work up. On further investigation, Lisa Wade has a history of anxiety and depression as a young adult and she lost her friend to lung cancer on Thursday.  NIH on Admission 0  Transient global amnesia in the setting of emotional triggers as well as prior history of anxiety/depression Code Stroke CT head No acute abnormality. ASPECTS 10.   MRI  Unremarkable exam for age.  2D Echo Pending --please call back if concerning findings. LDL 73 HgbA1c 5.5 VTE prophylaxis - Lovenox  Therapy recommendations:  Pending, she is at baseline  Disposition:  Home after completion of work up  EEG: Normal  Hyperlipidemia Home meds:  Crestor  10mg , resumed in hospital LDL 73, goal < 70 Continue statin at discharge  Diabetes type II Controlled Home meds:  Mounjaro HgbA1c 5.5, goal < 7.0 CBGs SSI Recommend close follow-up with PCP for better DM control  Anxiety/Depression Home meds: escitalopram  20mg  Resume in patient Follow up with PCP for further adjustments if needed  Other Stroke Risk Factors ETOH use, alcohol level <15, advised to drink no more than  1 drink(s) a day Obesity, Body mass index is 31.07 kg/m., BMI >/= 30 associated with increased stroke risk, recommend weight loss, diet and exercise as appropriate  Family hx stroke  Obstructive sleep apnea, on CPAP at home  Hospital day # 0  Patient seen and examined by NP/APP with MD. MD to update note as needed.   Lisa Mana, DNP, FNP-BC [facility] Pager: [phone]   Attending Neurohospitalist Addendum Patient seen and examined with APP/Resident. Agree with the history and physical as documented above. Agree with the plan as documented, which I helped formulate. I have independently reviewed the chart, obtained history, review of systems and examined the patient.I have personally  reviewed pertinent head/neck/spine imaging (CT/MRI). Plan relayed to Dr. Lilyan Remedies Please feel free to call with any questions.   Addendum 2D echo unremarkable.  No further workup.  -- Tona Francis, MD Neurologist [facility] Pager: [phone]

## 2024-02-17 NOTE — Care Management Obs Status (Signed)
 MEDICARE OBSERVATION STATUS NOTIFICATION   Patient Details  Name: Lisa Wade MRN: [id number] Date of Birth: [birth year]   Medicare Observation Status Notification Given:  Yes  Moon/Obs letter signed and copy provided  Wynonia Hedges 02/17/2024, 4:01 PM

## 2024-02-17 NOTE — ED Notes (Signed)
 [facility] called for transport.

## 2024-02-24 ENCOUNTER — Other Ambulatory Visit: Payer: Self-pay

## 2024-02-24 ENCOUNTER — Ambulatory Visit: Admitting: Physical Medicine and Rehabilitation

## 2024-02-24 VITALS — BP 104/67 | HR 76

## 2024-02-24 DIAGNOSIS — M47819 Spondylosis without myelopathy or radiculopathy, site unspecified: Secondary | ICD-10-CM | POA: Diagnosis not present

## 2024-02-24 MED ORDER — METHYLPREDNISOLONE ACETATE 40 MG/ML IJ SUSP
40.0000 mg | Freq: Once | INTRAMUSCULAR | Status: AC
  Administered 2024-02-24: 40 mg

## 2024-02-24 NOTE — Patient Instructions (Signed)

## 2024-02-24 NOTE — Progress Notes (Signed)
 Patient is here to have Bilateral L5-S1 facet joint injection. Taking Celebrex which helps. Good and bad days. Pain level today is a 1.     Pain Scale   Average Pain 1        +Driver, -BT, -Dye Allergies.

## 2024-02-28 NOTE — Procedures (Signed)
 Lumbar Facet Joint Intra-Articular Injection(s) with Fluoroscopic Guidance  [name]      Date of Birth: 03-22-1961 MRN: [id number] PCP: Candise Chambers, MD      Visit Date: 02/24/2024   Universal Protocol:    Date/Time: 02/24/2024  Consent Given By: the patient  Position: PRONE   Additional Comments: Vital signs were monitored before and after the procedure. Patient was prepped and draped in the usual sterile fashion. The correct patient, procedure, and site was verified.   Injection Procedure Details:  Procedure Site One Meds Administered:  Meds ordered this encounter  Medications   methylPREDNISolone  acetate (DEPO-MEDROL ) injection 40 mg     Laterality: Bilateral  Location/Site:  L5-S1  Needle size: 22 guage  Needle type: Spinal  Needle Placement: Articular  Findings:  -Comments: Excellent flow of contrast producing a partial arthrogram.  Procedure Details: The fluoroscope beam is vertically oriented in AP, and the inferior recess is visualized beneath the lower pole of the inferior apophyseal process, which represents the target point for needle insertion. When direct visualization is difficult the target point is located at the medial projection of the vertebral pedicle. The region overlying each aforementioned target is locally anesthetized with a 1 to 2 ml. volume of 1% Lidocaine  without Epinephrine .   The spinal needle was inserted into each of the above mentioned facet joints using biplanar fluoroscopic guidance. A 0.25 to 0.5 ml. volume of Isovue -250 was injected and a partial facet joint arthrogram was obtained. A single spot film was obtained of the resulting arthrogram.    One to 1.25 ml of the steroid/anesthetic solution was then injected into each of the facet joints noted above.   Additional Comments:  The patient tolerated the procedure well Dressing: 2 x 2 sterile gauze and Band-Aid    Post-procedure details: Patient was observed  during the procedure. Post-procedure instructions were reviewed.  Patient left the clinic in stable condition.

## 2024-02-28 NOTE — Progress Notes (Signed)
 Lisa Wade - 63 y.o. female MRN [id number]  Date of birth: Feb 07, 1961  Office Visit Note: Visit Date: 02/24/2024 PCP: Candise Chambers, MD Referred by: Candise Chambers, MD  Subjective: Chief Complaint  Patient presents with   Lower Back - Pain   HPI:  Lisa Wade is a 63 y.o. female who comes in today at the request of Elvan Hamel, FNP for planned Bilateral  L5-S1 Lumbar facet/medial branch block with fluoroscopic guidance.  The patient has failed conservative care including home exercise, medications, time and activity modification.  This injection will be diagnostic and hopefully therapeutic.  Please see requesting physician notes for further details and justification.  Exam has shown concordant pain with facet joint loading.   ROS Otherwise per HPI.  Assessment & Plan: Visit Diagnoses:    ICD-10-CM   1. Spondylosis without myelopathy or radiculopathy  M47.819 XR C-ARM NO REPORT    Facet Injection    methylPREDNISolone  acetate (DEPO-MEDROL ) injection 40 mg      Plan: No additional findings.   Meds & Orders:  Meds ordered this encounter  Medications   methylPREDNISolone  acetate (DEPO-MEDROL ) injection 40 mg    Orders Placed This Encounter  Procedures   Facet Injection   XR C-ARM NO REPORT    Follow-up: Return for visit to requesting provider as needed.   Procedures: No procedures performed  Lumbar Facet Joint Intra-Articular Injection(s) with Fluoroscopic Guidance  [name]      Date of Birth: Sep 21, 1961 MRN: [id number] PCP: Candise Chambers, MD      Visit Date: 02/24/2024   Universal Protocol:    Date/Time: 02/24/2024  Consent Given By: the patient  Position: PRONE   Additional Comments: Vital signs were monitored before and after the procedure. Patient was prepped and draped in the usual sterile fashion. The correct patient, procedure, and site was verified.   Injection Procedure Details:  Procedure Site One Meds  Administered:  Meds ordered this encounter  Medications   methylPREDNISolone  acetate (DEPO-MEDROL ) injection 40 mg     Laterality: Bilateral  Location/Site:  L5-S1  Needle size: 22 guage  Needle type: Spinal  Needle Placement: Articular  Findings:  -Comments: Excellent flow of contrast producing a partial arthrogram.  Procedure Details: The fluoroscope beam is vertically oriented in AP, and the inferior recess is visualized beneath the lower pole of the inferior apophyseal process, which represents the target point for needle insertion. When direct visualization is difficult the target point is located at the medial projection of the vertebral pedicle. The region overlying each aforementioned target is locally anesthetized with a 1 to 2 ml. volume of 1% Lidocaine  without Epinephrine .   The spinal needle was inserted into each of the above mentioned facet joints using biplanar fluoroscopic guidance. A 0.25 to 0.5 ml. volume of Isovue -250 was injected and a partial facet joint arthrogram was obtained. A single spot film was obtained of the resulting arthrogram.    One to 1.25 ml of the steroid/anesthetic solution was then injected into each of the facet joints noted above.   Additional Comments:  The patient tolerated the procedure well Dressing: 2 x 2 sterile gauze and Band-Aid    Post-procedure details: Patient was observed during the procedure. Post-procedure instructions were reviewed.  Patient left the clinic in stable condition.    Clinical History: CLINICAL DATA:  Low back pain radiating into legs, right-greater-than-left   EXAM: MRI LUMBAR SPINE WITHOUT CONTRAST   TECHNIQUE: Multiplanar, multisequence MR imaging of the lumbar  spine was performed. No intravenous contrast was administered.   COMPARISON:  02/03/2014   FINDINGS: Segmentation:  Standard.   Alignment:  Mild levocurvature.  No significant listhesis.   Vertebrae: No acute fracture or suspicious  osseous lesion. Endplate degenerative changes at L3-L5.   Conus medullaris and cauda equina: Conus extends to the L1 level. Conus and cauda equina appear normal.   Paraspinal and other soft tissues: No acute finding.   Disc levels:   T12-L1: No significant disc bulge. No spinal canal stenosis or neural foraminal narrowing.   L1-L2: Minimal disc bulge. Mild facet arthropathy. No spinal canal stenosis or neural foraminal narrowing.   L2-L3: Minimal disc bulge with central annular fissure. Mild facet arthropathy. No spinal canal stenosis or neural foraminal narrowing.   L3-L4: Minimal disc bulge with small left foraminal protrusion. Mild facet arthropathy. No spinal canal stenosis or neural foraminal narrowing.   L4-L5: Minimal disc bulge. Mild facet arthropathy. No spinal canal stenosis or neural foraminal narrowing.   L5-S1: Minimal disc bulge. Mild facet arthropathy. No spinal canal stenosis or neural foraminal narrowing.   IMPRESSION: Mild degenerative changes without spinal canal stenosis or neural foraminal narrowing.     Electronically Signed   By: Zoila Hines M.D.   On: 03/27/2022 02:55     Objective:  VS:  HT:    WT:   BMI:     BP:104/67  HR:76bpm  TEMP: ( )  RESP:  Physical Exam Vitals and nursing note reviewed.  Constitutional:      General: She is not in acute distress.    Appearance: Normal appearance. She is not ill-appearing.  HENT:     Head: Normocephalic and atraumatic.     Right Ear: External ear normal.     Left Ear: External ear normal.  Eyes:     Extraocular Movements: Extraocular movements intact.  Cardiovascular:     Rate and Rhythm: Normal rate.     Pulses: Normal pulses.  Pulmonary:     Effort: Pulmonary effort is normal. No respiratory distress.  Abdominal:     General: There is no distension.     Palpations: Abdomen is soft.  Musculoskeletal:        General: Tenderness present.     Cervical back: Neck supple.     Right  lower leg: No edema.     Left lower leg: No edema.     Comments: Patient has good distal strength with no pain over the greater trochanters.  No clonus or focal weakness.  Skin:    Findings: No erythema, lesion or rash.  Neurological:     General: No focal deficit present.     Mental Status: She is alert and oriented to person, place, and time.     Sensory: No sensory deficit.     Motor: No weakness or abnormal muscle tone.     Coordination: Coordination normal.  Psychiatric:        Mood and Affect: Mood normal.        Behavior: Behavior normal.      Imaging: No results found.

## 2024-03-18 ENCOUNTER — Encounter (INDEPENDENT_AMBULATORY_CARE_PROVIDER_SITE_OTHER): Admitting: Ophthalmology

## 2024-03-18 DIAGNOSIS — H33301 Unspecified retinal break, right eye: Secondary | ICD-10-CM | POA: Diagnosis not present

## 2024-03-18 DIAGNOSIS — H4311 Vitreous hemorrhage, right eye: Secondary | ICD-10-CM

## 2024-03-18 DIAGNOSIS — H43813 Vitreous degeneration, bilateral: Secondary | ICD-10-CM | POA: Diagnosis not present

## 2024-03-22 IMAGING — MR MR LUMBAR SPINE W/O CM
4 of 5 series · 27 of 48 positions shown · non-contrast
Comparison: 02/03/2014

CLINICAL DATA: Low back pain radiating into legs,
right-greater-than-left

EXAM:
MRI LUMBAR SPINE WITHOUT CONTRAST
TECHNIQUE: Multiplanar, multisequence MR imaging of the lumbar spine was
performed. No intravenous contrast was administered.

[Series 3: T2 · sagittal · 4.0mm · 1.09mm/px · 6 of 17 slices shown (1 of 2)]
[im 1/17]
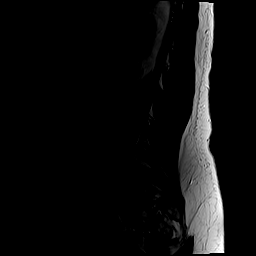
[im 4/17]
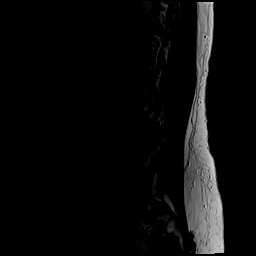
[im 7/17]
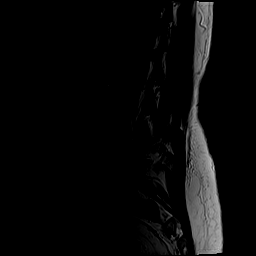
[im 10/17]
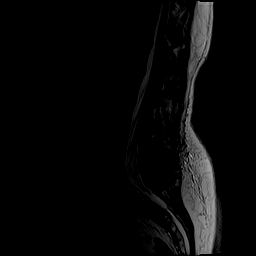
[im 13/17]
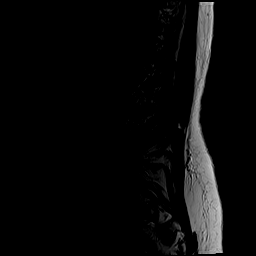
[im 17/17]
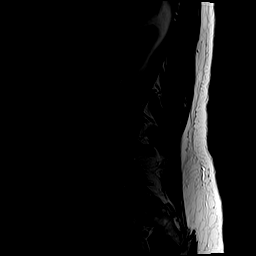

[Series 5: T1 · sagittal · 4.0mm · 1.09mm/px · 6 of 17 slices shown (1 of 2)]
[im 1/17]
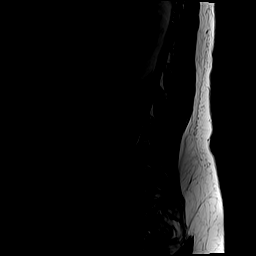
[im 4/17]
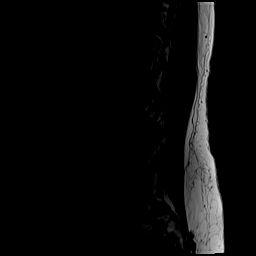
[im 7/17]
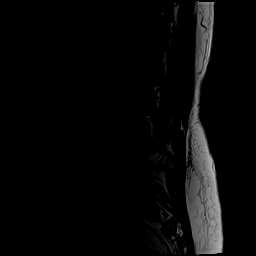
[im 10/17]
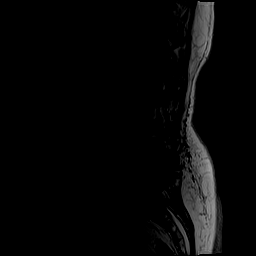
[im 13/17]
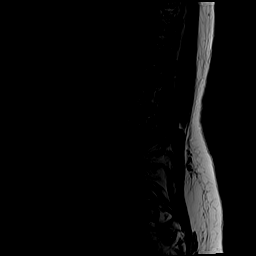
[im 17/17]
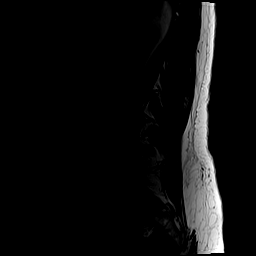

[Series 6: T2 · axial · 4.0mm · 0.39mm/px · z∈[-91,+146]mm · 9 of 42 slices shown (2 of 2)]
[im 1/42]
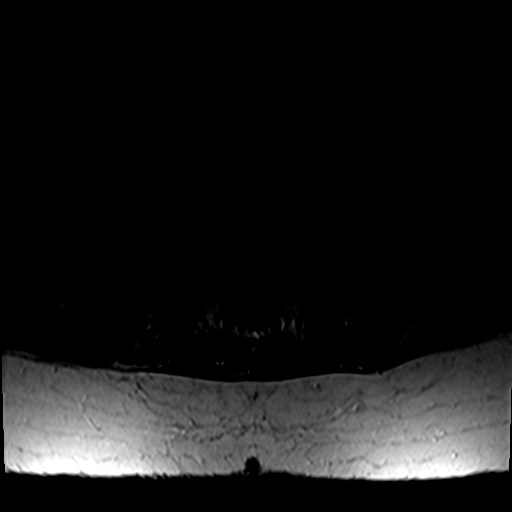
[im 6/42]
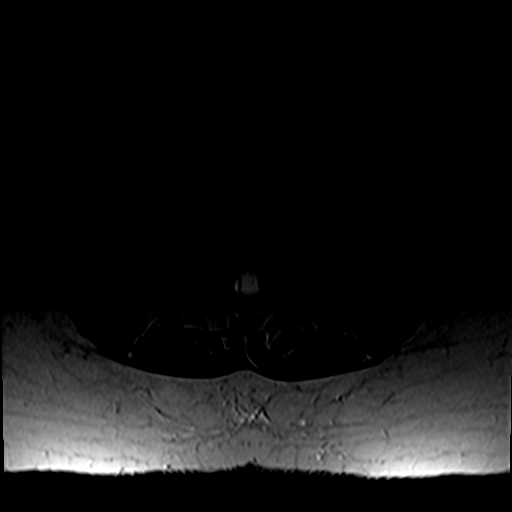
[im 12/42]
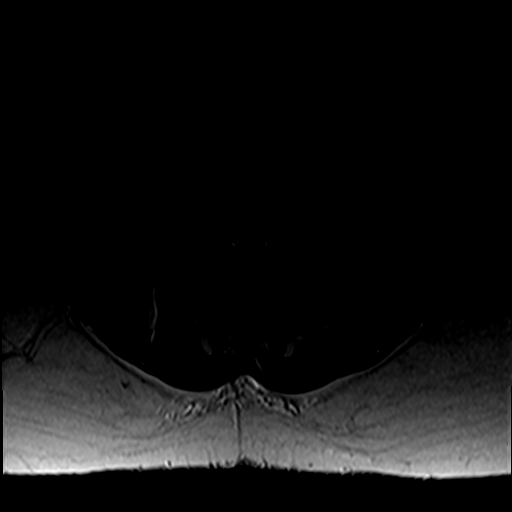
[im 18/42]
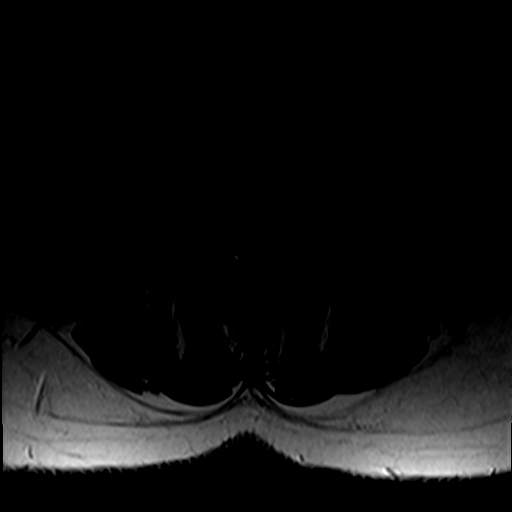
[im 21/42]
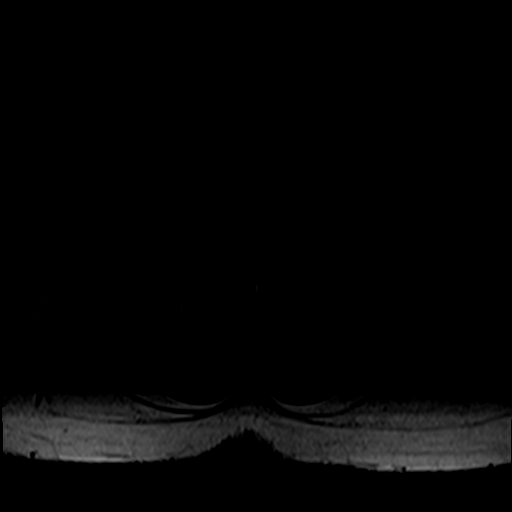
[im 24/42]
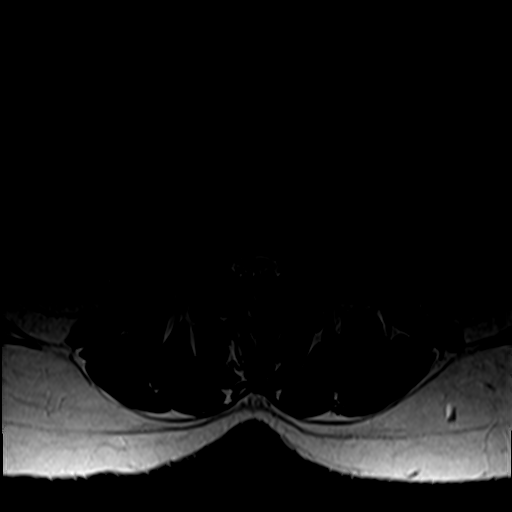
[im 30/42]
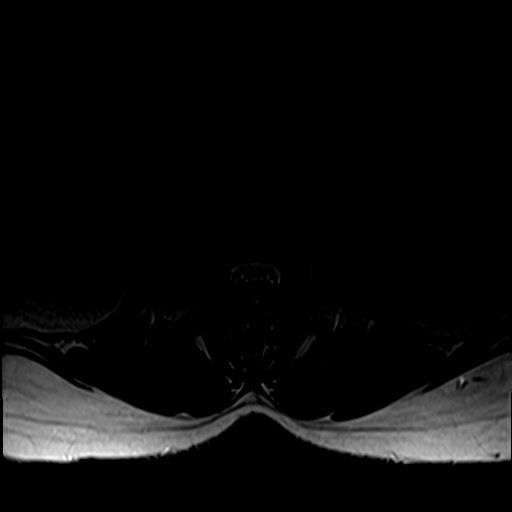
[im 36/42]
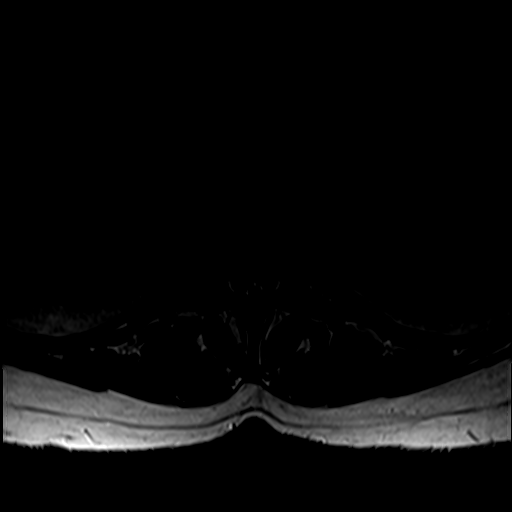
[im 42/42]
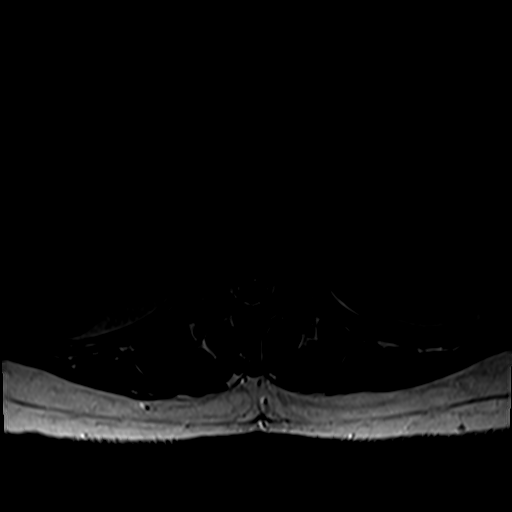

[Series 7: T1 · axial · 4.0mm · 0.39mm/px · z∈[-91,+118]mm · 6 of 42 slices shown (2 of 2)]
[im 1/42]
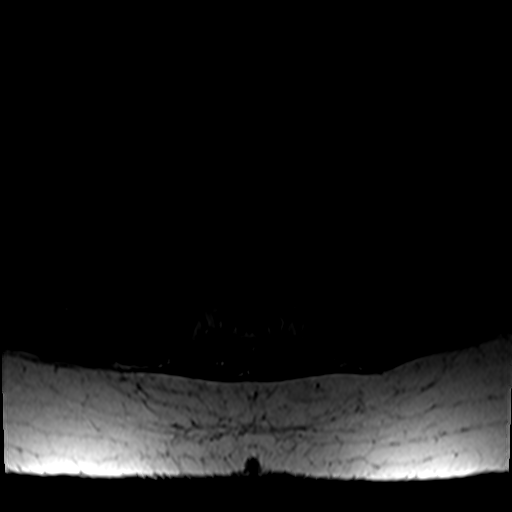
[im 6/42]
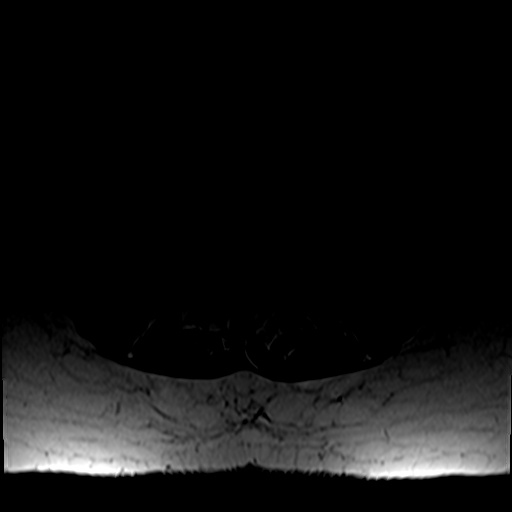
[im 12/42]
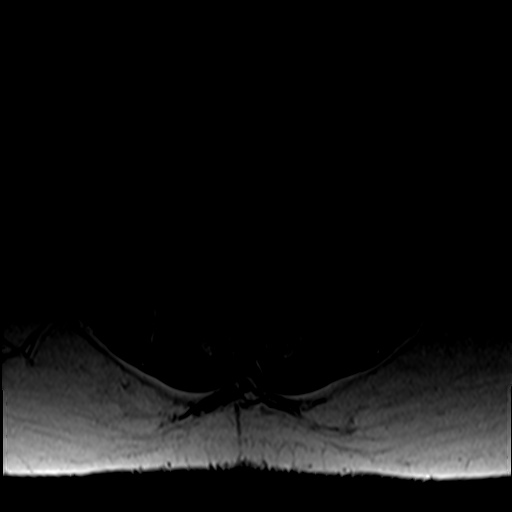
[im 18/42]
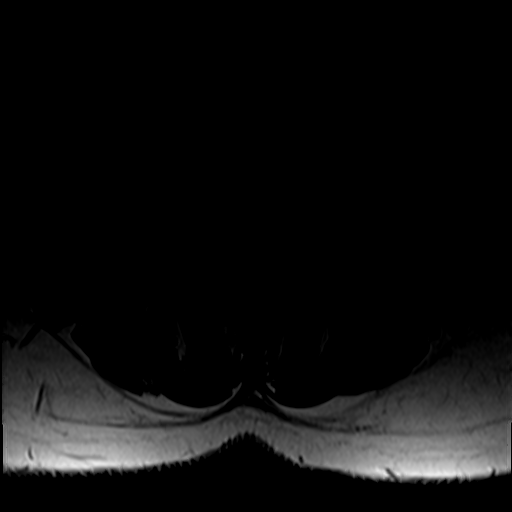
[im 21/42]
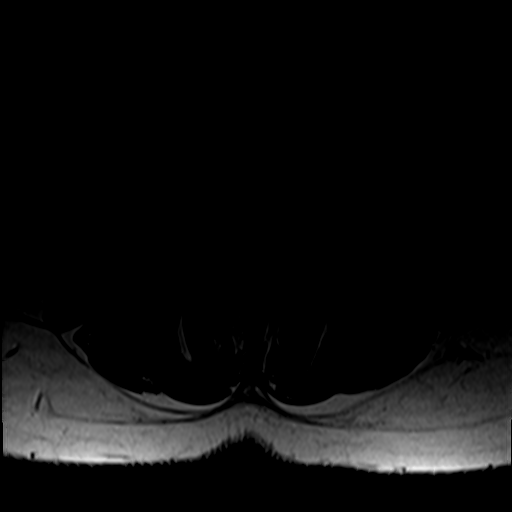
[im 36/42]
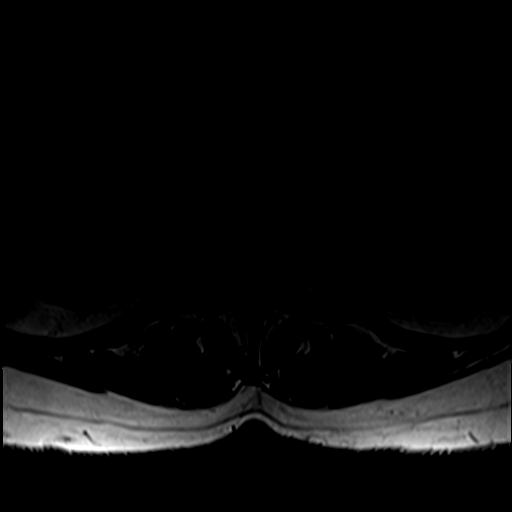

[27 of 48 positions shown; findings below may reference images not displayed]

FINDINGS: Segmentation:  Standard.

Alignment:  Mild levocurvature.  No significant listhesis.

Vertebrae: No acute fracture or suspicious osseous lesion. Endplate
degenerative changes at L3-L5.

Conus medullaris and cauda equina: Conus extends to the L1 level.
Conus and cauda equina appear normal.

Paraspinal and other soft tissues: No acute finding.

Disc levels:

T12-L1: No significant disc bulge. No spinal canal stenosis or
neural foraminal narrowing.

L1-L2: Minimal disc bulge. Mild facet arthropathy. No spinal canal
stenosis or neural foraminal narrowing.

L2-L3: Minimal disc bulge with central annular fissure. Mild facet
arthropathy. No spinal canal stenosis or neural foraminal narrowing.

L3-L4: Minimal disc bulge with small left foraminal protrusion. Mild
facet arthropathy. No spinal canal stenosis or neural foraminal
narrowing.

L4-L5: Minimal disc bulge. Mild facet arthropathy. No spinal canal
stenosis or neural foraminal narrowing.

L5-S1: Minimal disc bulge. Mild facet arthropathy. No spinal canal
stenosis or neural foraminal narrowing.
IMPRESSION: Mild degenerative changes without spinal canal stenosis or neural
foraminal narrowing.

## 2024-03-30 ENCOUNTER — Encounter (INDEPENDENT_AMBULATORY_CARE_PROVIDER_SITE_OTHER): Admitting: Ophthalmology

## 2024-03-30 DIAGNOSIS — H33301 Unspecified retinal break, right eye: Secondary | ICD-10-CM

## 2024-04-27 ENCOUNTER — Encounter (INDEPENDENT_AMBULATORY_CARE_PROVIDER_SITE_OTHER): Admitting: Ophthalmology

## 2024-04-27 ENCOUNTER — Ambulatory Visit (HOSPITAL_BASED_OUTPATIENT_CLINIC_OR_DEPARTMENT_OTHER): Admitting: Pulmonary Disease

## 2024-05-11 ENCOUNTER — Encounter (INDEPENDENT_AMBULATORY_CARE_PROVIDER_SITE_OTHER): Admitting: Ophthalmology

## 2024-05-11 DIAGNOSIS — H33301 Unspecified retinal break, right eye: Secondary | ICD-10-CM

## 2024-05-25 ENCOUNTER — Encounter (INDEPENDENT_AMBULATORY_CARE_PROVIDER_SITE_OTHER): Admitting: Ophthalmology

## 2024-05-25 DIAGNOSIS — H33301 Unspecified retinal break, right eye: Secondary | ICD-10-CM

## 2024-06-05 ENCOUNTER — Telehealth: Payer: Self-pay | Admitting: Physical Medicine and Rehabilitation

## 2024-06-05 NOTE — Telephone Encounter (Signed)
 Patient called and want to get an appointment for another  back injection. She said her pain has came back. CB#[phone]

## 2024-06-09 ENCOUNTER — Other Ambulatory Visit: Payer: Self-pay | Admitting: Physical Medicine and Rehabilitation

## 2024-06-09 DIAGNOSIS — M47816 Spondylosis without myelopathy or radiculopathy, lumbar region: Secondary | ICD-10-CM

## 2024-06-09 DIAGNOSIS — G8929 Other chronic pain: Secondary | ICD-10-CM

## 2024-06-09 DIAGNOSIS — M47819 Spondylosis without myelopathy or radiculopathy, site unspecified: Secondary | ICD-10-CM

## 2024-06-19 ENCOUNTER — Encounter: Payer: Self-pay | Admitting: Gastroenterology

## 2024-07-02 ENCOUNTER — Ambulatory Visit (HOSPITAL_BASED_OUTPATIENT_CLINIC_OR_DEPARTMENT_OTHER): Admitting: Pulmonary Disease

## 2024-07-02 ENCOUNTER — Encounter (HOSPITAL_BASED_OUTPATIENT_CLINIC_OR_DEPARTMENT_OTHER): Payer: Self-pay | Admitting: Pulmonary Disease

## 2024-07-02 VITALS — BP 113/70 | HR 71 | Ht 64.0 in | Wt 183.8 lb

## 2024-07-02 DIAGNOSIS — Z23 Encounter for immunization: Secondary | ICD-10-CM | POA: Diagnosis not present

## 2024-07-02 DIAGNOSIS — G4733 Obstructive sleep apnea (adult) (pediatric): Secondary | ICD-10-CM

## 2024-07-02 DIAGNOSIS — J4520 Mild intermittent asthma, uncomplicated: Secondary | ICD-10-CM | POA: Diagnosis not present

## 2024-07-02 MED ORDER — ALBUTEROL SULFATE HFA 108 (90 BASE) MCG/ACT IN AERS: 1.0000 | INHALATION_SPRAY | RESPIRATORY_TRACT | 3 refills | Status: AC | PRN

## 2024-07-02 NOTE — Progress Notes (Signed)
   Subjective:    Patient ID: Lisa Wade, female    DOB: 05-19-1961, 63 y.o.   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  History of Present Illness Lisa Wade is a 63 year old female with sleep apnea who presents for an annual follow-up.  Her CPAP machine is functioning well with auto settings between 5 and 15 cm H2O, and she is comfortable with these settings. She uses the machine for more than six hours per night, experiencing minimal residual events and mild leaks.  Two weeks ago, she experienced a sensation relieved by her inhaler, allowing a productive cough. This episode lasted about one to two weeks and has resolved. She suspects it might be related to allergies, possibly ragweed. She reports minimal wheezing and no exertion-related symptoms. She is unsure of the remaining amount of albuterol  in her inhaler and does not know if she has a refill.     Significant tests/ events reviewed   HST 04/2017 AHI20/h, low sat 88% 04/2023 Event monitor >> no arrythmias  Review of Systems  neg for any significant sore throat, dysphagia, itching, sneezing, nasal congestion or excess/ purulent secretions, fever, chills, sweats, unintended wt loss, pleuritic or exertional cp, hempoptysis, orthopnea pnd or change in chronic leg swelling. Also denies presyncope, palpitations, heartburn, abdominal pain, nausea, vomiting, diarrhea or change in bowel or urinary habits, dysuria,hematuria, rash, arthralgias, visual complaints, headache, numbness weakness or ataxia.      Objective:   Physical Exam  Gen. Pleasant, obese, in no distress ENT - no lesions, no  post nasal drip Neck: No JVD, no thyromegaly, no carotid bruits Lungs: no use of accessory muscles, no dullness to percussion, decreased without rales or rhonchi  Cardiovascular: Rhythm regular, heart sounds  normal, no murmurs or gallops, no peripheral edema Musculoskeletal: No deformities, no cyanosis or clubbing , no tremors       Assessment & Plan:   Assessment and Plan Assessment & Plan Obstructive sleep apnea Obstructive sleep apnea is well-controlled with CPAP therapy. The CPAP machine is set to auto settings with pressures ranging from 5 to 15 cm H2O, with an average pressure of 11 cm H2O and a maximum of 12 cm H2O. The number of apnea events has been reduced from 20 events per hour to about 1 event per hour, which is considered excellent control. Compliance with CPAP usage is good, with more than 6 hours of use per night. There are minimal leaks reported. She reports no issues with the current settings and finds the machine comfortable. - Change CPAP settings to auto 8 to 12 cm H2O to prevent excessive pressure. - Continue annual follow-up for sleep apnea management. - Administer flu shot.  Mild intermittent asthma Mild intermittent asthma with recent exacerbation possibly related to seasonal allergies, such as ragweed. Symptoms included a non-productive cough that improved with albuterol  use, suggesting bronchospasm. No significant wheezing or exertion-related symptoms reported. She is unsure about the remaining albuterol  supply and refill status. - Refill albuterol  prescription. - Monitor asthma symptoms and use albuterol  as needed.

## 2024-07-02 NOTE — Addendum Note (Signed)
 Addended by: TRUDY WARREN CROME on: 07/02/2024 09:07 AM   Modules accepted: Orders

## 2024-07-02 NOTE — Patient Instructions (Signed)
 X refill on albuterol  MDI  X Flu shot  X change to auto 8-12 cm    VISIT SUMMARY: Today, you came in for your annual follow-up appointment. We discussed your sleep apnea and recent asthma symptoms. Your CPAP machine is working well, and you are using it consistently. You also experienced a recent episode of coughing that was relieved by your inhaler, which we suspect might be related to seasonal allergies.  YOUR PLAN: -OBSTRUCTIVE SLEEP APNEA: Obstructive sleep apnea is a condition where your airway becomes blocked during sleep, causing breathing pauses. Your CPAP machine is effectively managing this condition, reducing your apnea events significantly. We will adjust your CPAP settings to auto 8 to 12 cm H2O to prevent excessive pressure. Please continue using your CPAP machine as you have been and come in for your annual follow-up. You will also receive a flu shot today.  -MILD INTERMITTENT ASTHMA: Mild intermittent asthma is a condition where your airways occasionally become inflamed, causing symptoms like coughing and wheezing. Your recent symptoms were likely due to seasonal allergies. We will refill your albuterol  prescription. Please monitor your asthma symptoms and use the albuterol  inhaler as needed.  INSTRUCTIONS: Please follow up annually for sleep apnea management. Continue using your CPAP machine with the new settings. Monitor your asthma symptoms and use your albuterol  inhaler as needed. If you experience any new or worsening symptoms, please contact our office.                      Contains text generated by Abridge.                                 Contains text generated by Abridge.

## 2024-07-13 ENCOUNTER — Ambulatory Visit: Admitting: Physical Medicine and Rehabilitation

## 2024-07-13 ENCOUNTER — Other Ambulatory Visit: Payer: Self-pay

## 2024-07-13 VITALS — BP 114/70 | HR 90

## 2024-07-13 DIAGNOSIS — M47816 Spondylosis without myelopathy or radiculopathy, lumbar region: Secondary | ICD-10-CM

## 2024-07-13 HISTORY — PX: OTHER SURGICAL HISTORY: SHX169

## 2024-07-13 MED ORDER — METHYLPREDNISOLONE ACETATE 80 MG/ML IJ SUSP
40.0000 mg | Freq: Once | INTRAMUSCULAR | Status: AC
  Administered 2024-07-13: 40 mg

## 2024-07-13 NOTE — Progress Notes (Signed)
 Pain Scale   Average Pain 5 Patient advising she has chronic lower back pain pain I constant when she is walking and standing and resting does decrease her pain        +Driver, -BT, + Iodine  Dye Allergies.

## 2024-07-20 ENCOUNTER — Ambulatory Visit

## 2024-07-20 ENCOUNTER — Encounter: Payer: Self-pay | Admitting: Gastroenterology

## 2024-07-20 VITALS — Ht 64.0 in | Wt 178.0 lb

## 2024-07-20 DIAGNOSIS — Z1211 Encounter for screening for malignant neoplasm of colon: Secondary | ICD-10-CM

## 2024-07-20 MED ORDER — NA SULFATE-K SULFATE-MG SULF 17.5-3.13-1.6 GM/177ML PO SOLN: 1.0000 | Freq: Once | ORAL | 0 refills | Status: AC

## 2024-07-20 NOTE — Procedures (Signed)
 Lumbar Facet Joint Nerve Denervation  [name]      Date of Birth: 29-Oct-1960 MRN: [id number] PCP: Windy Coy, MD      Visit Date: 07/13/2024   Universal Protocol:    Date/Time: 10/13/256:12 AM  Consent Given By: the patient  Position: PRONE  Additional Comments: Vital signs were monitored before and after the procedure. Patient was prepped and draped in the usual sterile fashion. The correct patient, procedure, and site was verified.   Injection Procedure Details:   Procedure diagnoses:  1. Spondylosis without myelopathy or radiculopathy, lumbar region      Meds Administered:  Meds ordered this encounter  Medications   methylPREDNISolone  acetate (DEPO-MEDROL ) injection 40 mg     Laterality: Bilateral  Location/Site:  L5-S1, L4 medial branch and L5 dorsal ramus  Needle: 18 ga.,  10mm active tip, RF Cannula  Needle Placement: Along juncture of superior articular process and transverse pocess  Findings:  -Comments:  Procedure Details: For each desired target nerve, the corresponding transverse process (sacral ala for the L5 dorsal rami) was identified and the fluoroscope was positioned to square off the endplates of the corresponding vertebral body to achieve a true AP midline view.  The beam was then obliqued 15 to 20 degrees and caudally tilted 15 to 20 degrees to line up a trajectory along the target nerves. The skin over the target of the junction of superior articulating process and transverse process (sacral ala for the L5 dorsal rami) was infiltrated with 1ml of 1% Lidocaine  without Epinephrine .  The 18 gauge 10mm active tip outer cannula was advanced in trajectory view to the target.  This procedure was repeated for each target nerve.  Then, for all levels, the outer cannula placement was fine-tuned and the position was then confirmed with bi-planar imaging.    Test stimulation was done both at sensory and motor levels to ensure there  was no radicular stimulation. The target tissues were then infiltrated with 1 ml of 1% Lidocaine  without Epinephrine . Subsequently, a percutaneous neurotomy was carried out for 90 seconds at 80 degrees Celsius.  After the completion of the lesion, 1 ml of injectate was delivered. It was then repeated for each facet joint nerve mentioned above. Appropriate radiographs were obtained to verify the probe placement during the neurotomy.   Additional Comments:  The patient tolerated the procedure well Dressing: 2 x 2 sterile gauze and Band-Aid    Post-procedure details: Patient was observed during the procedure. Post-procedure instructions were reviewed.  Patient left the clinic in stable condition.

## 2024-07-20 NOTE — Progress Notes (Signed)
 Lisa Wade - 63 y.o. female MRN [id number]  Date of birth: 04-03-1961  Office Visit Note: Visit Date: 07/13/2024 PCP: Windy Coy, MD Referred by: Windy Coy, MD  Subjective: Chief Complaint  Patient presents with   Lower Back - Pain   HPI:  Lisa Wade is a 63 y.o. female who comes in todayfor planned radiofrequency ablation of the Bilateral L5-S1 Lumbar facet joints. This would be ablation of the corresponding medial branches and/or dorsal rami.  Patient has had double diagnostic blocks with more than 50% relief.  These are documented on pain diary.  They have had chronic back pain for quite some time, more than 3 months, which has been an ongoing situation with recalcitrant axial back pain.  They have no radicular pain.  Their axial pain is worse with standing and ambulating and on exam today with facet loading.  They have had physical therapy as well as home exercise program.  The imaging noted in the chart below indicated facet pathology. Accordingly they meet all the criteria and qualification for for radiofrequency ablation and we are going to complete this today hopefully for more longer term relief as part of comprehensive management program.    ROS Otherwise per HPI.  Assessment & Plan: Visit Diagnoses:    ICD-10-CM   1. Spondylosis without myelopathy or radiculopathy, lumbar region  M47.816 XR C-ARM NO REPORT    Radiofrequency,Lumbar    methylPREDNISolone  acetate (DEPO-MEDROL ) injection 40 mg      Plan: No additional findings.   Meds & Orders:  Meds ordered this encounter  Medications   methylPREDNISolone  acetate (DEPO-MEDROL ) injection 40 mg    Orders Placed This Encounter  Procedures   Radiofrequency,Lumbar   XR C-ARM NO REPORT    Follow-up: Return for visit to requesting provider as needed.   Procedures: No procedures performed  Lumbar Facet Joint Nerve Denervation  [name]      Date of Birth: Jun 14, 1961 MRN:  [id number] PCP: Windy Coy, MD      Visit Date: 07/13/2024   Universal Protocol:    Date/Time: 10/13/256:12 AM  Consent Given By: the patient  Position: PRONE  Additional Comments: Vital signs were monitored before and after the procedure. Patient was prepped and draped in the usual sterile fashion. The correct patient, procedure, and site was verified.   Injection Procedure Details:   Procedure diagnoses:  1. Spondylosis without myelopathy or radiculopathy, lumbar region      Meds Administered:  Meds ordered this encounter  Medications   methylPREDNISolone  acetate (DEPO-MEDROL ) injection 40 mg     Laterality: Bilateral  Location/Site:  L5-S1, L4 medial branch and L5 dorsal ramus  Needle: 18 ga.,  10mm active tip, RF Cannula  Needle Placement: Along juncture of superior articular process and transverse pocess  Findings:  -Comments:  Procedure Details: For each desired target nerve, the corresponding transverse process (sacral ala for the L5 dorsal rami) was identified and the fluoroscope was positioned to square off the endplates of the corresponding vertebral body to achieve a true AP midline view.  The beam was then obliqued 15 to 20 degrees and caudally tilted 15 to 20 degrees to line up a trajectory along the target nerves. The skin over the target of the junction of superior articulating process and transverse process (sacral ala for the L5 dorsal rami) was infiltrated with 1ml of 1% Lidocaine  without Epinephrine .  The 18 gauge 10mm active tip outer cannula was advanced in trajectory view to  the target.  This procedure was repeated for each target nerve.  Then, for all levels, the outer cannula placement was fine-tuned and the position was then confirmed with bi-planar imaging.    Test stimulation was done both at sensory and motor levels to ensure there was no radicular stimulation. The target tissues were then infiltrated with 1 ml of 1% Lidocaine  without  Epinephrine . Subsequently, a percutaneous neurotomy was carried out for 90 seconds at 80 degrees Celsius.  After the completion of the lesion, 1 ml of injectate was delivered. It was then repeated for each facet joint nerve mentioned above. Appropriate radiographs were obtained to verify the probe placement during the neurotomy.   Additional Comments:  The patient tolerated the procedure well Dressing: 2 x 2 sterile gauze and Band-Aid    Post-procedure details: Patient was observed during the procedure. Post-procedure instructions were reviewed.  Patient left the clinic in stable condition.      Clinical History: CLINICAL DATA:  Low back pain radiating into legs, right-greater-than-left   EXAM: MRI LUMBAR SPINE WITHOUT CONTRAST   TECHNIQUE: Multiplanar, multisequence MR imaging of the lumbar spine was performed. No intravenous contrast was administered.   COMPARISON:  02/03/2014   FINDINGS: Segmentation:  Standard.   Alignment:  Mild levocurvature.  No significant listhesis.   Vertebrae: No acute fracture or suspicious osseous lesion. Endplate degenerative changes at L3-L5.   Conus medullaris and cauda equina: Conus extends to the L1 level. Conus and cauda equina appear normal.   Paraspinal and other soft tissues: No acute finding.   Disc levels:   T12-L1: No significant disc bulge. No spinal canal stenosis or neural foraminal narrowing.   L1-L2: Minimal disc bulge. Mild facet arthropathy. No spinal canal stenosis or neural foraminal narrowing.   L2-L3: Minimal disc bulge with central annular fissure. Mild facet arthropathy. No spinal canal stenosis or neural foraminal narrowing.   L3-L4: Minimal disc bulge with small left foraminal protrusion. Mild facet arthropathy. No spinal canal stenosis or neural foraminal narrowing.   L4-L5: Minimal disc bulge. Mild facet arthropathy. No spinal canal stenosis or neural foraminal narrowing.   L5-S1: Minimal disc  bulge. Mild facet arthropathy. No spinal canal stenosis or neural foraminal narrowing.   IMPRESSION: Mild degenerative changes without spinal canal stenosis or neural foraminal narrowing.     Electronically Signed   By: Donald Campion M.D.   On: 03/27/2022 02:55     Objective:  VS:  HT:    WT:   BMI:     BP:114/70  HR:90bpm  TEMP: ( )  RESP:  Physical Exam Vitals and nursing note reviewed.  Constitutional:      General: She is not in acute distress.    Appearance: Normal appearance. She is not ill-appearing.  HENT:     Head: Normocephalic and atraumatic.     Right Ear: External ear normal.     Left Ear: External ear normal.  Eyes:     Extraocular Movements: Extraocular movements intact.  Cardiovascular:     Rate and Rhythm: Normal rate.     Pulses: Normal pulses.  Pulmonary:     Effort: Pulmonary effort is normal. No respiratory distress.  Abdominal:     General: There is no distension.     Palpations: Abdomen is soft.  Musculoskeletal:        General: Tenderness present.     Cervical back: Neck supple.     Right lower leg: No edema.     Left lower leg: No  edema.     Comments: Patient has good distal strength with no pain over the greater trochanters.  No clonus or focal weakness.  Skin:    Findings: No erythema, lesion or rash.  Neurological:     General: No focal deficit present.     Mental Status: She is alert and oriented to person, place, and time.     Sensory: No sensory deficit.     Motor: No weakness or abnormal muscle tone.     Coordination: Coordination normal.  Psychiatric:        Mood and Affect: Mood normal.        Behavior: Behavior normal.      Imaging: No results found.

## 2024-07-20 NOTE — Progress Notes (Signed)

## 2024-08-03 ENCOUNTER — Encounter: Payer: Self-pay | Admitting: Gastroenterology

## 2024-08-03 ENCOUNTER — Ambulatory Visit (AMBULATORY_SURGERY_CENTER): Admitting: Gastroenterology

## 2024-08-03 VITALS — BP 130/62 | HR 65 | Temp 97.3°F | Resp 20 | Ht 64.0 in | Wt 178.0 lb

## 2024-08-03 DIAGNOSIS — K648 Other hemorrhoids: Secondary | ICD-10-CM | POA: Diagnosis not present

## 2024-08-03 DIAGNOSIS — D123 Benign neoplasm of transverse colon: Secondary | ICD-10-CM | POA: Diagnosis not present

## 2024-08-03 DIAGNOSIS — Z1211 Encounter for screening for malignant neoplasm of colon: Secondary | ICD-10-CM

## 2024-08-03 DIAGNOSIS — K5730 Diverticulosis of large intestine without perforation or abscess without bleeding: Secondary | ICD-10-CM | POA: Diagnosis not present

## 2024-08-03 MED ORDER — SODIUM CHLORIDE 0.9 % IV SOLN: 500.0000 mL | INTRAVENOUS | Status: AC

## 2024-08-03 NOTE — Patient Instructions (Signed)

## 2024-08-03 NOTE — Progress Notes (Signed)
 [name]   Primary Care Physician:  Windy Coy, MD   Reason for Procedure:   Colon cancer screening  Plan:    colonoscopy     HPI: Lisa Wade is a 63 y.o. female  here for colonoscopy screening - last exam 2014 - normal per patient.   . Patient denies any bowel symptoms at this time. No family history of colon cancer known. Otherwise feels well without any cardiopulmonary symptoms.   I have discussed risks / benefits of anesthesia and endoscopic procedure with [name] and they wish to proceed with the exams as outlined today.    Past Medical History:  Diagnosis Date   Acid reflux    Anxiety    Back pain    Bladder prolapse, female, acquired    C. 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 EMERSON Campanile. Patient uses cpap nightly per pt on 10/26/21.   SOBOE (shortness of breath on exertion) 10/13/2020   per MD note from [facility],  SOBOE likely related to obesity & exericise induced.   Social anxiety disorder 1998   Swallowing difficulty    feeling of fullness in upper chest, resolved as of 10/26/2021 per pt   Swelling of both lower extremities    hx of left ankle swelling and knee arthritis   Vitamin D  deficiency    Wears contact lenses     Past Surgical History:  Procedure Laterality Date   ANTERIOR (CYSTOCELE) AND POSTERIOR REPAIR (RECTOCELE) WITH XENFORM GRAFT AND SACROSPINOUS FIXATION N/A  11/06/2021   Procedure: ANTERIOR AND POSTERIOR COLPORRHAPHY;  Surgeon: Cathlyn JAYSON Nikki Bobie FORBES, MD;  Location: [facility];  Service: Gynecology;  Laterality: N/A;   APPENDECTOMY  1972   Back Ablation  07/13/2024   BLADDER SUSPENSION N/A 11/06/2021   Procedure: TRANSVAGINAL TAPE EXACT MIDURETHRAL;  Surgeon: Cathlyn JAYSON Nikki Bobie FORBES, MD;  Location: [facility];  Service: Gynecology;  Laterality: N/A;   BREAST ENHANCEMENT SURGERY  10/08/2006   [facility]ine Kurtis)   COLONOSCOPY     approximately 2014, had previous colonoscopy 5 years ealier   CYSTOSCOPY N/A 11/06/2021   Procedure: CYSTOSCOPY;  Surgeon: Cathlyn JAYSON Nikki Bobie FORBES, MD;  Location: [facility];  Service: Gynecology;  Laterality: N/A;   DG BARIUM SWALLOW ([facility] HX)     around 2022   TOTAL LAPAROSCOPIC HYSTERECTOMY WITH SALPINGECTOMY Bilateral 11/06/2021   Procedure: TOTAL LAPAROSCOPIC HYSTERECTOMY WITH BILATERAL SALPINGOOOPHORECTOMY.;  Surgeon: Cathlyn JAYSON Nikki Bobie FORBES, MD;  Location: [facility];  Service: Gynecology;  Laterality: Bilateral;   TUBAL LIGATION  01/1995    Prior to Admission medications   Medication Sig Start Date End Date Taking? Authorizing Provider  acetaminophen  (TYLENOL ) 500 MG tablet Take 500 mg by mouth every 6 (six) hours as needed for moderate pain (  pain score 4-6).   Yes [provider]  celecoxib (CELEBREX) 200 MG capsule Take 200 mg by mouth 2 (two) times daily as needed for moderate pain (pain score 4-6). 02/10/24  Yes [provider]  escitalopram  (LEXAPRO ) 20 MG tablet Take 20 mg by mouth daily.   Yes [provider]  rosuvastatin  (CRESTOR ) 10 MG tablet Take 10 mg by mouth daily. 03/18/20  Yes [provider]  albuterol  (VENTOLIN  HFA) 108 (90 Base) MCG/ACT inhaler Inhale 1-2 puffs into the lungs every 4 (four) hours as needed for wheezing or shortness of breath. 07/02/24   Jude Harden GAILS, MD  CALCIUM  PO Take 2  Doses by mouth daily. Patient not taking: Reported on 07/20/2024    [provider]  Cholecalciferol (D3 PO) Take 1 tablet by mouth daily. + magnesium    [provider]  ibuprofen  (ADVIL ) 200 MG tablet Take 200-800 mg by mouth every 6 (six) hours as needed for moderate pain (pain score 4-6). Patient not taking: Reported on 07/20/2024    [provider]  Menaquinone-7 (K2 PO) Take 1 tablet by mouth daily.    [provider]  methocarbamol  (ROBAXIN ) 500 MG tablet Take 1 tablet (500 mg total) by mouth every 8 (eight) hours as needed for muscle spasms. 08/14/23   Eldonna Novel, MD  omega-3 acid ethyl esters (LOVAZA) 1 g capsule Take by mouth 2 (two) times daily.    [provider]  ondansetron  (ZOFRAN -ODT) 4 MG disintegrating tablet Take 4 mg by mouth every 8 (eight) hours as needed for nausea or vomiting. Patient not taking: Reported on 07/20/2024 09/09/23   [provider]  tirzepatide CLOYDE) 5 MG/0.5ML Pen Inject 5 mg into the skin once a week. Patient not taking: Reported on 07/20/2024    [provider]  Turmeric Curcumin CAPS Take by mouth.    [provider]  valACYclovir  (VALTREX ) 500 MG tablet Take one tablet (500 mg) by mouth twice daily for 3 days for an outbreak. Patient not taking: Reported on 08/03/2024 06/21/22   Cathlyn JAYSON Nikki Bobie FORBES, MD    Current Outpatient Medications  Medication Sig Dispense Refill   acetaminophen  (TYLENOL ) 500 MG tablet Take 500 mg by mouth every 6 (six) hours as needed for moderate pain (pain score 4-6).     celecoxib (CELEBREX) 200 MG capsule Take 200 mg by mouth 2 (two) times daily as needed for moderate pain (pain score 4-6).     escitalopram  (LEXAPRO ) 20 MG tablet Take 20 mg by mouth daily.     rosuvastatin  (CRESTOR ) 10 MG tablet Take 10 mg by mouth daily.     albuterol  (VENTOLIN  HFA) 108 (90 Base) MCG/ACT inhaler Inhale 1-2 puffs into the lungs every 4 (four) hours as needed  for wheezing or shortness of breath. 1 each 3   CALCIUM  PO Take 2 Doses by mouth daily. (Patient not taking: Reported on 07/20/2024)     Cholecalciferol (D3 PO) Take 1 tablet by mouth daily. + magnesium     ibuprofen  (ADVIL ) 200 MG tablet Take 200-800 mg by mouth every 6 (six) hours as needed for moderate pain (pain score 4-6). (Patient not taking: Reported on 07/20/2024)     Menaquinone-7 (K2 PO) Take 1 tablet by mouth daily.     methocarbamol  (ROBAXIN ) 500 MG tablet Take 1 tablet (500 mg total) by mouth every 8 (eight) hours as needed for muscle spasms. 60 tablet 0   omega-3 acid ethyl esters (LOVAZA) 1 g capsule  Take by mouth 2 (two) times daily.     ondansetron  (ZOFRAN -ODT) 4 MG disintegrating tablet Take 4 mg by mouth every 8 (eight) hours as needed for nausea or vomiting. (Patient not taking: Reported on 07/20/2024)     tirzepatide (MOUNJARO) 5 MG/0.5ML Pen Inject 5 mg into the skin once a week. (Patient not taking: Reported on 07/20/2024)     Turmeric Curcumin CAPS Take by mouth.     valACYclovir  (VALTREX ) 500 MG tablet Take one tablet (500 mg) by mouth twice daily for 3 days for an outbreak. (Patient not taking: Reported on 08/03/2024) 30 tablet 2   Current Facility-Administered Medications  Medication Dose Route Frequency Provider Last Rate Last Admin   0.9 %  sodium chloride  infusion  500 mL Intravenous Continuous Kano Heckmann, Elspeth SQUIBB, MD        Allergies as of 08/03/2024 - Review Complete 08/03/2024  Allergen Reaction Noted   Iodinated contrast media Hives 12/11/2021   Cephalosporins Diarrhea 03/03/2013    Family History  Problem Relation Age of Onset   Osteoarthritis Mother    Migraines Mother    Thyroid disease Mother    Depression Mother    Anxiety disorder Mother    Obesity Mother    Cancer Father        lung   Thyroid disease Father    Cancer Brother        testicular cancer   Osteoarthritis Maternal Grandmother    Hypertension Paternal Grandmother    Stroke  Paternal Grandmother    Colon cancer Neg Hx    Esophageal cancer Neg Hx    Rectal cancer Neg Hx    Stomach cancer Neg Hx     Social History   Socioeconomic History   Marital status: Married    Spouse name: R Gordy Salmon   Number of children: Not on file   Years of education: Not on file   Highest education level: Not on file  Occupational History   Not on file  Tobacco Use   Smoking status: Former    Current packs/day: 0.00    Types: Cigarettes    Start date: 2002    Quit date: 2012    Years since quitting: 13.8   Smokeless tobacco: Never  Vaping Use   Vaping status: Never Used  Substance and Sexual Activity   Alcohol use: Yes    Comment: 1-3 glasses of wine per month   Drug use: No   Sexual activity: Yes    Partners: Male    Birth control/protection: Surgical, Post-menopausal    Comment: BTL  Other Topics Concern   Not on file  Social History Narrative   Not on file   Social Drivers of Health   Financial Resource Strain: Not on file  Food Insecurity: No Food Insecurity (02/17/2024)   Hunger Vital Sign    Worried About Running Out of Food in the Last Year: Never true    Ran Out of Food in the Last Year: Never true  Transportation Needs: No Transportation Needs (02/17/2024)   PRAPARE - Administrator, Civil Service (Medical): No    Lack of Transportation (Non-Medical): No  Physical Activity: Not on file  Stress: Not on file  Social Connections: Unknown (02/20/2022)   Received from [facility]   Social Network    Social Network: Not on file  Intimate Partner Violence: Not At Risk (02/17/2024)   Humiliation, Afraid, Rape, and Kick questionnaire    Fear of Current or Ex-Partner:  No    Emotionally Abused: No    Physically Abused: No    Sexually Abused: No    Review of Systems: All other review of systems negative except as mentioned in the HPI.  Physical Exam: Vital signs BP 119/68   Pulse 75   Temp (!) 97.3 F (36.3 C) (Temporal)   Ht 5' 4  (1.626 m)   Wt 178 lb (80.7 kg)   LMP 07/08/2020 (Approximate)   SpO2 96%   BMI 30.55 kg/m   General:   Alert,  Well-developed, pleasant and cooperative in NAD Lungs:  Clear throughout to auscultation.   Heart:  Regular rate and rhythm Abdomen:  Soft, nontender and nondistended.   Neuro/Psych:  Alert and cooperative. Normal mood and affect. A and O x 3  Marcey Naval, MD [facility]

## 2024-08-03 NOTE — Progress Notes (Signed)
 Report to PACU, RN, vss, BBS= Clear.

## 2024-08-03 NOTE — Progress Notes (Signed)
 Called to room to assist during endoscopic procedure.  Patient ID and intended procedure confirmed with present staff. Received instructions for my participation in the procedure from the performing physician.

## 2024-08-03 NOTE — Op Note (Signed)
 [facility] Patient Name: Lisa Wade Procedure Date: 08/03/2024 10:37 AM MRN: [id number] Endoscopist: Elspeth P. Leigh , MD, [id number] Age: 63 Referring MD:  Date of Birth: October 19, 1960 Gender: Female Account #: [id number] Procedure:                Colonoscopy Indications:              Screening for colorectal malignant neoplasm Medicines:                Monitored Anesthesia Care Procedure:                Pre-Anesthesia Assessment:                           - Prior to the procedure, a History and Physical                            was performed, and patient medications and                            allergies were reviewed. The patient's tolerance of                            previous anesthesia was also reviewed. The risks                            and benefits of the procedure and the sedation                            options and risks were discussed with the patient.                            All questions were answered, and informed consent                            was obtained. Prior Anticoagulants: The patient has                            taken no anticoagulant or antiplatelet agents. ASA                            Grade Assessment: II - A patient with mild systemic                            disease. After reviewing the risks and benefits,                            the patient was deemed in satisfactory condition to                            undergo the procedure.                           After obtaining informed consent, the colonoscope  was passed under direct vision. Throughout the                            procedure, the patient's blood pressure, pulse, and                            oxygen saturations were monitored continuously. The                            Olympus Scope SN [id number] was introduced through the                            anus and advanced to the the cecum, identified by                             appendiceal orifice and ileocecal valve. The                            colonoscopy was performed without difficulty. The                            patient tolerated the procedure well. The quality                            of the bowel preparation was good. The ileocecal                            valve, appendiceal orifice, and rectum were                            photographed. Scope In: 10:45:28 AM Scope Out: 11:08:01 AM Scope Withdrawal Time: 0 hours 18 minutes 34 seconds  Total Procedure Duration: 0 hours 22 minutes 33 seconds  Findings:                 The perianal and digital rectal examinations were                            normal.                           A 10 to 12 mm polyp was found in the transverse                            colon. The polyp was sessile. The polyp was removed                            with a cold snare. Resection and retrieval were                            complete.                           A few small-mouthed diverticula were found in the  sigmoid colon.                           Internal hemorrhoids were found during retroflexion.                           The exam was otherwise without abnormality. Complications:            No immediate complications. Estimated blood loss:                            Minimal. Estimated Blood Loss:     Estimated blood loss was minimal. Impression:               - One 10 to 12 mm polyp in the transverse colon,                            removed with a cold snare. Resected and retrieved.                           - Diverticulosis in the sigmoid colon.                           - Internal hemorrhoids.                           - The examination was otherwise normal. Recommendation:           - Patient has a contact number available for                            emergencies. The signs and symptoms of potential                            delayed complications were discussed with the                             patient. Return to normal activities tomorrow.                            Written discharge instructions were provided to the                            patient.                           - Resume previous diet.                           - Continue present medications.                           - Await pathology results. [name], MD 08/03/2024 11:12:58 AM This report has been signed electronically.

## 2024-08-03 NOTE — Progress Notes (Signed)
 Pt's states no medical or surgical changes since previsit or office visit.

## 2024-08-04 ENCOUNTER — Telehealth: Payer: Self-pay

## 2024-08-04 NOTE — Telephone Encounter (Signed)
 Left message on answering machine.

## 2024-08-05 ENCOUNTER — Ambulatory Visit: Payer: Self-pay | Admitting: Gastroenterology

## 2024-08-05 LAB — SURGICAL PATHOLOGY

## 2024-08-10 ENCOUNTER — Encounter: Payer: Self-pay | Admitting: Radiology

## 2024-08-14 ENCOUNTER — Other Ambulatory Visit: Payer: Self-pay | Admitting: Physical Medicine and Rehabilitation

## 2024-08-14 ENCOUNTER — Encounter: Payer: Self-pay | Admitting: Physical Medicine and Rehabilitation

## 2024-08-14 DIAGNOSIS — G8929 Other chronic pain: Secondary | ICD-10-CM

## 2024-08-14 DIAGNOSIS — M47816 Spondylosis without myelopathy or radiculopathy, lumbar region: Secondary | ICD-10-CM

## 2024-08-21 ENCOUNTER — Ambulatory Visit
Admission: RE | Admit: 2024-08-21 | Discharge: 2024-08-21 | Disposition: A | Discharge status: Home / Self Care | Source: Ambulatory Visit | Attending: Physical Medicine and Rehabilitation | Admitting: Physical Medicine and Rehabilitation

## 2024-08-21 DIAGNOSIS — M47816 Spondylosis without myelopathy or radiculopathy, lumbar region: Secondary | ICD-10-CM

## 2024-08-21 DIAGNOSIS — G8929 Other chronic pain: Secondary | ICD-10-CM

## 2024-08-24 ENCOUNTER — Ambulatory Visit: Payer: Self-pay

## 2024-09-25 ENCOUNTER — Encounter (INDEPENDENT_AMBULATORY_CARE_PROVIDER_SITE_OTHER): Admitting: Ophthalmology

## 2024-09-25 DIAGNOSIS — H43813 Vitreous degeneration, bilateral: Secondary | ICD-10-CM | POA: Diagnosis not present

## 2024-09-25 DIAGNOSIS — H33301 Unspecified retinal break, right eye: Secondary | ICD-10-CM

## 2024-09-25 DIAGNOSIS — H2513 Age-related nuclear cataract, bilateral: Secondary | ICD-10-CM | POA: Diagnosis not present

## 2024-11-23 ENCOUNTER — Ambulatory Visit: Admitting: Diagnostic Neuroimaging

## 2024-11-23 ENCOUNTER — Ambulatory Visit: Admitting: Neurology
